# Patient Record
Sex: Female | Born: 1940 | Race: White | Hispanic: No | Marital: Single | Smoking: Never smoker
Health system: Southern US, Community
[De-identification: ages and names within clinical notes are randomized; demographics above are authoritative.]

## PROBLEM LIST (undated history)

## (undated) DIAGNOSIS — N189 Chronic kidney disease, unspecified: Secondary | ICD-10-CM

## (undated) DIAGNOSIS — K449 Diaphragmatic hernia without obstruction or gangrene: Secondary | ICD-10-CM

## (undated) DIAGNOSIS — E119 Type 2 diabetes mellitus without complications: Secondary | ICD-10-CM

## (undated) DIAGNOSIS — I739 Peripheral vascular disease, unspecified: Secondary | ICD-10-CM

## (undated) DIAGNOSIS — D649 Anemia, unspecified: Secondary | ICD-10-CM

## (undated) DIAGNOSIS — Z86010 Personal history of colonic polyps: Secondary | ICD-10-CM

## (undated) DIAGNOSIS — R011 Cardiac murmur, unspecified: Secondary | ICD-10-CM

## (undated) DIAGNOSIS — K222 Esophageal obstruction: Secondary | ICD-10-CM

## (undated) DIAGNOSIS — Z860101 Personal history of adenomatous and serrated colon polyps: Secondary | ICD-10-CM

## (undated) DIAGNOSIS — I10 Essential (primary) hypertension: Secondary | ICD-10-CM

## (undated) DIAGNOSIS — D369 Benign neoplasm, unspecified site: Secondary | ICD-10-CM

## (undated) DIAGNOSIS — K219 Gastro-esophageal reflux disease without esophagitis: Secondary | ICD-10-CM

## (undated) DIAGNOSIS — H35379 Puckering of macula, unspecified eye: Secondary | ICD-10-CM

## (undated) HISTORY — PX: OTHER SURGICAL HISTORY: SHX169

## (undated) HISTORY — PX: ABDOMINAL HYSTERECTOMY: SHX81

## (undated) HISTORY — DX: Diaphragmatic hernia without obstruction or gangrene: K44.9

## (undated) HISTORY — DX: Esophageal obstruction: K22.2

## (undated) HISTORY — DX: Type 2 diabetes mellitus without complications: E11.9

## (undated) HISTORY — PX: SPINE SURGERY: SHX786

## (undated) HISTORY — DX: Essential (primary) hypertension: I10

## (undated) HISTORY — DX: Personal history of colonic polyps: Z86.010

## (undated) HISTORY — DX: Gastro-esophageal reflux disease without esophagitis: K21.9

## (undated) HISTORY — DX: Chronic kidney disease, unspecified: N18.9

## (undated) HISTORY — DX: Personal history of adenomatous and serrated colon polyps: Z86.0101

## (undated) HISTORY — DX: Benign neoplasm, unspecified site: D36.9

## (undated) HISTORY — PX: ELBOW SURGERY: SHX618

---

## 1978-08-05 HISTORY — PX: BACK SURGERY: SHX140

## 1999-09-04 ENCOUNTER — Other Ambulatory Visit: Admission: RE | Admit: 1999-09-04 | Discharge: 1999-09-04 | Payer: Self-pay | Admitting: Gynecology

## 2002-10-11 ENCOUNTER — Other Ambulatory Visit: Admission: RE | Admit: 2002-10-11 | Discharge: 2002-10-11 | Payer: Self-pay

## 2002-10-25 ENCOUNTER — Ambulatory Visit (HOSPITAL_COMMUNITY): Admission: RE | Admit: 2002-10-25 | Discharge: 2002-10-25 | Payer: Self-pay | Admitting: Internal Medicine

## 2005-10-29 ENCOUNTER — Ambulatory Visit (HOSPITAL_COMMUNITY): Admission: RE | Admit: 2005-10-29 | Discharge: 2005-10-29 | Payer: Self-pay | Admitting: Internal Medicine

## 2005-10-29 ENCOUNTER — Ambulatory Visit: Payer: Self-pay | Admitting: Internal Medicine

## 2010-08-05 DIAGNOSIS — D369 Benign neoplasm, unspecified site: Secondary | ICD-10-CM

## 2010-08-05 DIAGNOSIS — K222 Esophageal obstruction: Secondary | ICD-10-CM

## 2010-08-05 DIAGNOSIS — K449 Diaphragmatic hernia without obstruction or gangrene: Secondary | ICD-10-CM

## 2010-08-05 HISTORY — DX: Diaphragmatic hernia without obstruction or gangrene: K44.9

## 2010-08-05 HISTORY — DX: Benign neoplasm, unspecified site: D36.9

## 2010-08-05 HISTORY — DX: Esophageal obstruction: K22.2

## 2010-11-21 ENCOUNTER — Encounter: Payer: Self-pay | Admitting: Internal Medicine

## 2010-11-26 ENCOUNTER — Ambulatory Visit (INDEPENDENT_AMBULATORY_CARE_PROVIDER_SITE_OTHER): Payer: Medicare Other | Admitting: Gastroenterology

## 2010-11-26 ENCOUNTER — Encounter: Payer: Self-pay | Admitting: Gastroenterology

## 2010-11-26 VITALS — BP 160/100 | HR 65 | Temp 98.5°F | Ht 66.0 in | Wt 163.4 lb

## 2010-11-26 DIAGNOSIS — D126 Benign neoplasm of colon, unspecified: Secondary | ICD-10-CM

## 2010-11-26 DIAGNOSIS — K219 Gastro-esophageal reflux disease without esophagitis: Secondary | ICD-10-CM

## 2010-11-26 NOTE — Assessment & Plan Note (Signed)
Chronic GERD for more than 5 years. No prior EGD. History of abnormal barium pill esophagram per patient description in the past with resolution of symptoms after starting omeprazole. Recommend EGD at this time to rule out complications of GERD such as Barrett's esophagus or esophageal stricture. I have discussed the risks, alternatives, benefits with regards to but not limited to the risk of reaction to medication, bleeding, infection, perforation and the patient is agreeable to proceed. Written consent to be obtained.

## 2010-11-26 NOTE — Progress Notes (Signed)
Primary Care Physician:  Estanislado Pandy, MD  Primary Gastroenterologist:  Roetta Sessions  Chief Complaint  Patient presents with  . Colon Cancer Screening    h/o polyps    HPI:  MELESSIA Garza is a 70 y.o. female here to schedule surveillance colonoscopy due to history of adenomatous colonic polyps. Her last colonoscopy was in March of 2007 at which time she had a diminutive rectal polyp which was benign. Back in 2004 she had a tubular adenoma removed. In 2008, she was hospitalized at [facility] for ischemic colitis. At that time she had a flexible sigmoidoscopy by Dr. Karilyn Cota.   She has a bowel movement 1-3 times per day. Stools are fairly soft and sometimes pudding consistency. Some fecal seepage at times.  No melena, brbpr. No abdominal pain. No n/v. She has a history of heartburn for greater than 5 years. No prior EGD. In the past she had issues with dysphagia and describes having a barium swallow done. She states that the pill got stuck. She was started on omeprazole and the dysphagia went away. Heartburn controlled on omeprazole. No dysphagia/odynophagia. If stops omeprazole for period of time, then feels like food sticks.      Current Outpatient Prescriptions  Medication Sig Dispense Refill  . lisinopril-hydrochlorothiazide (PRINZIDE,ZESTORETIC) 10-12.5 MG per tablet Take 1 tablet by mouth daily.        [name] omeprazole (PRILOSEC) 20 MG capsule Take 20 mg by mouth daily.          Allergies as of 11/26/2010  . (No Known Allergies)   Past Medical History  Diagnosis Date  . HTN (hypertension)   . GERD (gastroesophageal reflux disease)   . Hx of adenomatous colonic polyps     Last colonoscopy 2007. Benign polyp at that time. Tubular adenoma found at colonoscopy in 2004.     Past Surgical History  Procedure Date  . Back surgery 1980    ruptured disc  . Elbow surgery   . Abdominal hysterectomy     complicated by bladder injury s/p repair, endometriosis     Family History  Problem  Relation Age of Onset  . Colon cancer Neg Hx   . Breast cancer Mother   . Breast cancer Daughter   . Heart attack Father     History   Social History  . Marital Status: Single    Spouse Name: N/A    Number of Children: 4  . Years of Education: N/A   Occupational History  .       [employer]    Social History Main Topics  . Smoking status: Never Smoker   . Smokeless tobacco: Not on file  . Alcohol Use: No  . Drug Use: Not on file  . Sexually Active: Not on file     ROS:  General: Negative for anorexia, weight loss, fever, chills, fatigue, weakness. Eyes: Negative for vision changes.  ENT: Negative for hoarseness, difficulty swallowing , nasal congestion. CV: Negative for chest pain, angina, palpitations, dyspnea on exertion, peripheral edema.  Respiratory: Negative for dyspnea at rest, dyspnea on exertion, cough, sputum, wheezing.  GI: See history of present illness. GU:  Negative for dysuria, hematuria, urinary incontinence, urinary frequency, nocturnal urination.  MS: Negative for joint pain, low back pain.  Derm: Negative for rash or itching.  Neuro: Negative for weakness, abnormal sensation, seizure, frequent headaches, memory loss, confusion.  Psych: Negative for anxiety, depression, suicidal ideation, hallucinations.  Endo: Negative for unusual weight change.  Heme: Negative for  bruising or bleeding. Allergy: Negative for rash or hives.    Physical Examination:  BP 160/100  Pulse 65  Temp(Src) 98.5 F (36.9 C) (Tympanic)  Ht 5\' 6"  (1.676 m)  Wt 163 lb 6.4 oz (74.118 kg)  BMI 26.37 kg/m2   General: Well-nourished, well-developed in no acute distress.  Head: Normocephalic, atraumatic.   Eyes: Conjunctiva pink, no icterus. Mouth: Oropharyngeal mucosa moist and pink , no lesions erythema or exudate. Neck: Supple without thyromegaly, masses, or lymphadenopathy.  Lungs: Clear to auscultation bilaterally.  Heart: Regular rate and rhythm, no murmurs rubs or  gallops.  Abdomen: Bowel sounds are normal, nontender, nondistended, no hepatosplenomegaly or masses, no abdominal bruits or    hernia , no rebound or guarding.   Extremities: No lower extremity edema.  Neuro: Alert and oriented x 4 , grossly normal neurologically.  Skin: Warm and dry, no rash or jaundice.   Psych: Alert and cooperative, normal mood and affect.

## 2010-11-26 NOTE — Assessment & Plan Note (Signed)
Due for surveillance colonoscopy at this time.  I have discussed the risks, alternatives, benefits with regards to but not limited to the risk of reaction to medication, bleeding, infection, perforation and the patient is agreeable to proceed. Written consent to be obtained.  

## 2010-11-26 NOTE — Progress Notes (Signed)
Cc to PCP 

## 2010-12-11 HISTORY — PX: ESOPHAGOGASTRODUODENOSCOPY (EGD) WITH ESOPHAGEAL DILATION: SHX5812

## 2010-12-17 ENCOUNTER — Encounter: Payer: Medicare Other | Admitting: Internal Medicine

## 2010-12-17 ENCOUNTER — Other Ambulatory Visit: Payer: Self-pay | Admitting: Internal Medicine

## 2010-12-17 ENCOUNTER — Ambulatory Visit (HOSPITAL_COMMUNITY)
Admission: RE | Admit: 2010-12-17 | Discharge: 2010-12-17 | Disposition: A | Discharge status: Home / Self Care | Payer: Medicare Other | Source: Ambulatory Visit | Attending: Internal Medicine | Admitting: Internal Medicine

## 2010-12-17 DIAGNOSIS — Z860100 Personal history of colon polyps, unspecified: Secondary | ICD-10-CM | POA: Insufficient documentation

## 2010-12-17 DIAGNOSIS — D129 Benign neoplasm of anus and anal canal: Secondary | ICD-10-CM | POA: Insufficient documentation

## 2010-12-17 DIAGNOSIS — D128 Benign neoplasm of rectum: Secondary | ICD-10-CM

## 2010-12-17 DIAGNOSIS — D131 Benign neoplasm of stomach: Secondary | ICD-10-CM | POA: Insufficient documentation

## 2010-12-17 DIAGNOSIS — D126 Benign neoplasm of colon, unspecified: Secondary | ICD-10-CM | POA: Insufficient documentation

## 2010-12-17 DIAGNOSIS — K449 Diaphragmatic hernia without obstruction or gangrene: Secondary | ICD-10-CM | POA: Insufficient documentation

## 2010-12-17 DIAGNOSIS — Z79899 Other long term (current) drug therapy: Secondary | ICD-10-CM | POA: Insufficient documentation

## 2010-12-17 DIAGNOSIS — K633 Ulcer of intestine: Secondary | ICD-10-CM | POA: Insufficient documentation

## 2010-12-17 DIAGNOSIS — Z09 Encounter for follow-up examination after completed treatment for conditions other than malignant neoplasm: Secondary | ICD-10-CM

## 2010-12-17 DIAGNOSIS — R1310 Dysphagia, unspecified: Secondary | ICD-10-CM | POA: Insufficient documentation

## 2010-12-17 DIAGNOSIS — K210 Gastro-esophageal reflux disease with esophagitis: Secondary | ICD-10-CM

## 2010-12-17 DIAGNOSIS — I10 Essential (primary) hypertension: Secondary | ICD-10-CM | POA: Insufficient documentation

## 2010-12-17 DIAGNOSIS — E785 Hyperlipidemia, unspecified: Secondary | ICD-10-CM | POA: Insufficient documentation

## 2010-12-17 DIAGNOSIS — K222 Esophageal obstruction: Secondary | ICD-10-CM

## 2010-12-17 HISTORY — PX: COLONOSCOPY: SHX174

## 2010-12-18 LAB — H. PYLORI ANTIBODY, IGG: H Pylori IgG: <0.4 {ISR}

## 2010-12-24 NOTE — Progress Notes (Signed)
Cc to PCP 

## 2010-12-24 NOTE — Op Note (Signed)
NAMECAROLEANN, CASLER               ACCOUNT NO.:  [id number]  MEDICAL RECORD NO.:  [id number]           PATIENT TYPE:  O  LOCATION:  DAYP                          FACILITY:  [facility]  PHYSICIAN:  R. Roetta Sessions, M.D. DATE OF BIRTH:  September 21, 1940  DATE OF PROCEDURE:  12/17/2010 DATE OF DISCHARGE:                              OPERATIVE REPORT   PROCEDURE:  EGD with Elease Hashimoto dilation followed by ileocolonoscopy with multiple snare polypectomies biopsy.  INDICATIONS FOR PROCEDURE:  The patient is a pleasant 70 year old lady with history colonic adenomas here for surveillance.  She does not have any lower GI tract symptoms at this time, also she has chronic reflux, takes omeprazole intermittently and has esophageal dysphagia intermittently to solid food.  EGD and colonoscopy now being done. Risks, benefits, limitations, alternatives, and imponderables have been discussed, questions answered.  Please see the documentation in the medical record.  PROCEDURE NOTE:  O2 saturation, blood pressure, pulse, respirations were monitored throughout the entirety of both procedures.  CONSCIOUS SEDATION:  Versed 5 mg IV, Demerol 50 mg IV in divided doses.  INSTRUMENT:  Pentax video chip system.  Cetacaine spray for topical pharyngeal anesthesia.  FINDINGS:  EGD.  Examination of the tubular esophagus revealed a Schatzki's ring and a couple of tiny distal esophageal erosions.  There were no Barrett's esophagus.  EG junction was easily traversed.  Stomach:  Gastric cavity was emptied and insufflated well with air. Thorough examination of gastric mucosa including retroflexion of proximal stomach, esophagogastric junction demonstrated a small hiatal hernia and multiple gastric polyps, otherwise gastric mucosa appeared normal.  Pylorus was patent, easily traversed.  Examination of the bulb and second portion revealed bulbar erosions only.  THERAPEUTIC/DIAGNOSTIC MANEUVERS PERFORMED:  Scope was removed.   A 54- French Maloney dilator was passed to full insertion with ease.  A look back revealed no apparent complication related to passage of the dilator.  Subsequently, one of the gastric polyps was biopsied/removed from the biopsy forceps.  The patient tolerated the procedure and was prepped for colonoscopy.  Digital rectal exam revealed no abnormalities.  ENDOSCOPIC FINDINGS:  Prep was adequate.  Colon:  Colonic mucosa was surveyed from the rectosigmoid junction through the left transverse right colon to the appendiceal orifice, ileocecal valve/cecum.  These structures were well seen and photographed record.  The ileocecal valve was somewhat flat.  I intubated terminal ileum 20 cm.  The terminal ileum was markedly abnormal with multiple ulcers and cobblestoning of the distal 10 cm of ileum.  There were 2 ulcerated polyps in this segment of GI tract, the largest about approximately 1.25 cm, the other about 7 mm.  Please see the photographs.  Ultimately, biopsies of the ulcerated mucosa were taken separately for histologic study.  The two polyps were removed with hot snare cautery, recovered out through the [employer].  As far as the colon is concerned, the patient's multiple colonic polyps as well as snares and small splenic flexure polyp on the way in.  The patient had multiple ascending colon polyps which were snared and suctioned through the scope on the way out with the ileal polyp  fragment tubular released out to [facility] and then retrieved for colonoscopic polypectomy.  All previously mentioned mucosal surfaces were again seen.  The polyp fragments in the ileum were taken out with the scope in the [facility].  Scope was reintroduced into the rectum where there was a 6-mm polyp at 10 cm and a second 6-mm polyp at 2 cm which were resected with hot snare and submitted separately.  No other lesions were seen.  Retroflexion was performed.  The patient tolerated the lengthy colonoscopy well.   Cecal withdrawal time 22 minutes.  IMPRESSION: 1. EGD, Schatzki's ring with distal esophageal erosions consistent     with mild erosive reflux esophagitis status post dilation described     above. 2. Hiatal hernia.  Gastric polyp status post biopsy.  Bulbar erosions     of uncertain significance otherwise D1 and D2 appeared normal.  COLONOSCOPY FINDINGS: 1. Rectal polyps resected as described above. 2. Splenic flexure and ascending colon polyps resected. 3. Abnormal terminal ileum with ileal ulcers and cobblestoning     suspicious for inflammatory bowel disease.  Two large ileopolyp     status post hot snare polypectomy.  RECOMMENDATIONS: 1. Reflux literature and polyp literature provided to Ms. Dalbert Garnet.     She is to continue omeprazole 20 mg orally daily. 2. No aspirin or arthritis medications for 7 days. 3. Follow up on path. 4. Further recommendations to follow.     Jonathon Bellows, M.D.     RMR/MEDQ  D:  12/17/2010  T:  12/17/2010  Job:  [id number]  cc:   Fara Chute, MD Fax: [phone]  Electronically Signed by Lorrin Goodell M.D. on 12/24/2010 10:04:26 AM

## 2011-03-06 ENCOUNTER — Encounter (INDEPENDENT_AMBULATORY_CARE_PROVIDER_SITE_OTHER): Payer: Medicare Other | Admitting: Ophthalmology

## 2011-03-06 DIAGNOSIS — D3130 Benign neoplasm of unspecified choroid: Secondary | ICD-10-CM

## 2011-03-06 DIAGNOSIS — H43819 Vitreous degeneration, unspecified eye: Secondary | ICD-10-CM

## 2011-03-06 DIAGNOSIS — H35379 Puckering of macula, unspecified eye: Secondary | ICD-10-CM

## 2011-03-06 DIAGNOSIS — H2510 Age-related nuclear cataract, unspecified eye: Secondary | ICD-10-CM

## 2011-08-12 DIAGNOSIS — J209 Acute bronchitis, unspecified: Secondary | ICD-10-CM | POA: Diagnosis not present

## 2011-08-14 ENCOUNTER — Ambulatory Visit (INDEPENDENT_AMBULATORY_CARE_PROVIDER_SITE_OTHER): Payer: Medicare Other | Admitting: Ophthalmology

## 2011-08-14 DIAGNOSIS — I10 Essential (primary) hypertension: Secondary | ICD-10-CM

## 2011-08-14 DIAGNOSIS — H35039 Hypertensive retinopathy, unspecified eye: Secondary | ICD-10-CM | POA: Diagnosis not present

## 2011-08-14 DIAGNOSIS — H35379 Puckering of macula, unspecified eye: Secondary | ICD-10-CM

## 2011-08-14 DIAGNOSIS — H348392 Tributary (branch) retinal vein occlusion, unspecified eye, stable: Secondary | ICD-10-CM

## 2011-08-14 DIAGNOSIS — H2510 Age-related nuclear cataract, unspecified eye: Secondary | ICD-10-CM

## 2011-08-14 DIAGNOSIS — H43819 Vitreous degeneration, unspecified eye: Secondary | ICD-10-CM

## 2011-08-14 DIAGNOSIS — D3130 Benign neoplasm of unspecified choroid: Secondary | ICD-10-CM

## 2011-08-19 ENCOUNTER — Ambulatory Visit (INDEPENDENT_AMBULATORY_CARE_PROVIDER_SITE_OTHER): Payer: Medicare Other | Admitting: Ophthalmology

## 2011-08-19 DIAGNOSIS — H348392 Tributary (branch) retinal vein occlusion, unspecified eye, stable: Secondary | ICD-10-CM | POA: Diagnosis not present

## 2011-08-19 DIAGNOSIS — H43819 Vitreous degeneration, unspecified eye: Secondary | ICD-10-CM

## 2011-08-19 DIAGNOSIS — D3130 Benign neoplasm of unspecified choroid: Secondary | ICD-10-CM | POA: Diagnosis not present

## 2011-08-19 DIAGNOSIS — I10 Essential (primary) hypertension: Secondary | ICD-10-CM | POA: Diagnosis not present

## 2011-08-19 DIAGNOSIS — H35039 Hypertensive retinopathy, unspecified eye: Secondary | ICD-10-CM

## 2011-08-19 DIAGNOSIS — H35379 Puckering of macula, unspecified eye: Secondary | ICD-10-CM

## 2011-08-19 DIAGNOSIS — H2510 Age-related nuclear cataract, unspecified eye: Secondary | ICD-10-CM

## 2011-09-11 ENCOUNTER — Encounter (INDEPENDENT_AMBULATORY_CARE_PROVIDER_SITE_OTHER): Payer: Medicare Other | Admitting: Ophthalmology

## 2011-09-11 DIAGNOSIS — H2510 Age-related nuclear cataract, unspecified eye: Secondary | ICD-10-CM

## 2011-09-11 DIAGNOSIS — D3130 Benign neoplasm of unspecified choroid: Secondary | ICD-10-CM | POA: Diagnosis not present

## 2011-09-11 DIAGNOSIS — H348392 Tributary (branch) retinal vein occlusion, unspecified eye, stable: Secondary | ICD-10-CM

## 2011-09-11 DIAGNOSIS — I10 Essential (primary) hypertension: Secondary | ICD-10-CM | POA: Diagnosis not present

## 2011-09-11 DIAGNOSIS — H35039 Hypertensive retinopathy, unspecified eye: Secondary | ICD-10-CM | POA: Diagnosis not present

## 2011-09-11 DIAGNOSIS — H43819 Vitreous degeneration, unspecified eye: Secondary | ICD-10-CM

## 2011-10-09 ENCOUNTER — Encounter (INDEPENDENT_AMBULATORY_CARE_PROVIDER_SITE_OTHER): Payer: Medicare Other | Admitting: Ophthalmology

## 2011-10-09 DIAGNOSIS — H348392 Tributary (branch) retinal vein occlusion, unspecified eye, stable: Secondary | ICD-10-CM

## 2011-10-09 DIAGNOSIS — H35039 Hypertensive retinopathy, unspecified eye: Secondary | ICD-10-CM | POA: Diagnosis not present

## 2011-10-09 DIAGNOSIS — I10 Essential (primary) hypertension: Secondary | ICD-10-CM

## 2011-10-09 DIAGNOSIS — D3130 Benign neoplasm of unspecified choroid: Secondary | ICD-10-CM | POA: Diagnosis not present

## 2011-10-09 DIAGNOSIS — H43819 Vitreous degeneration, unspecified eye: Secondary | ICD-10-CM

## 2011-10-09 DIAGNOSIS — H2510 Age-related nuclear cataract, unspecified eye: Secondary | ICD-10-CM

## 2011-10-22 DIAGNOSIS — I10 Essential (primary) hypertension: Secondary | ICD-10-CM | POA: Diagnosis not present

## 2011-10-22 DIAGNOSIS — E7800 Pure hypercholesterolemia, unspecified: Secondary | ICD-10-CM | POA: Diagnosis not present

## 2011-10-29 DIAGNOSIS — I10 Essential (primary) hypertension: Secondary | ICD-10-CM | POA: Diagnosis not present

## 2011-10-29 DIAGNOSIS — E7800 Pure hypercholesterolemia, unspecified: Secondary | ICD-10-CM | POA: Diagnosis not present

## 2011-11-01 ENCOUNTER — Encounter (INDEPENDENT_AMBULATORY_CARE_PROVIDER_SITE_OTHER): Payer: Medicare Other | Admitting: Ophthalmology

## 2011-11-01 DIAGNOSIS — H348392 Tributary (branch) retinal vein occlusion, unspecified eye, stable: Secondary | ICD-10-CM

## 2011-11-01 DIAGNOSIS — H35039 Hypertensive retinopathy, unspecified eye: Secondary | ICD-10-CM

## 2011-11-01 DIAGNOSIS — H2510 Age-related nuclear cataract, unspecified eye: Secondary | ICD-10-CM

## 2011-11-01 DIAGNOSIS — I10 Essential (primary) hypertension: Secondary | ICD-10-CM | POA: Diagnosis not present

## 2011-11-01 DIAGNOSIS — D3130 Benign neoplasm of unspecified choroid: Secondary | ICD-10-CM

## 2011-11-01 DIAGNOSIS — H43819 Vitreous degeneration, unspecified eye: Secondary | ICD-10-CM

## 2011-11-29 ENCOUNTER — Encounter (INDEPENDENT_AMBULATORY_CARE_PROVIDER_SITE_OTHER): Payer: Medicare Other | Admitting: Ophthalmology

## 2011-11-29 DIAGNOSIS — H35039 Hypertensive retinopathy, unspecified eye: Secondary | ICD-10-CM

## 2011-11-29 DIAGNOSIS — I10 Essential (primary) hypertension: Secondary | ICD-10-CM

## 2011-11-29 DIAGNOSIS — H2510 Age-related nuclear cataract, unspecified eye: Secondary | ICD-10-CM

## 2011-11-29 DIAGNOSIS — H43819 Vitreous degeneration, unspecified eye: Secondary | ICD-10-CM

## 2011-11-29 DIAGNOSIS — H35349 Macular cyst, hole, or pseudohole, unspecified eye: Secondary | ICD-10-CM

## 2011-11-29 DIAGNOSIS — H35379 Puckering of macula, unspecified eye: Secondary | ICD-10-CM

## 2011-11-29 DIAGNOSIS — D3130 Benign neoplasm of unspecified choroid: Secondary | ICD-10-CM

## 2011-11-29 DIAGNOSIS — H348392 Tributary (branch) retinal vein occlusion, unspecified eye, stable: Secondary | ICD-10-CM

## 2011-12-26 ENCOUNTER — Encounter (INDEPENDENT_AMBULATORY_CARE_PROVIDER_SITE_OTHER): Payer: Medicare Other | Admitting: Ophthalmology

## 2011-12-31 ENCOUNTER — Encounter (INDEPENDENT_AMBULATORY_CARE_PROVIDER_SITE_OTHER): Payer: Medicare Other | Admitting: Ophthalmology

## 2011-12-31 DIAGNOSIS — H35039 Hypertensive retinopathy, unspecified eye: Secondary | ICD-10-CM | POA: Diagnosis not present

## 2011-12-31 DIAGNOSIS — H348392 Tributary (branch) retinal vein occlusion, unspecified eye, stable: Secondary | ICD-10-CM

## 2011-12-31 DIAGNOSIS — D3130 Benign neoplasm of unspecified choroid: Secondary | ICD-10-CM

## 2011-12-31 DIAGNOSIS — H35349 Macular cyst, hole, or pseudohole, unspecified eye: Secondary | ICD-10-CM | POA: Diagnosis not present

## 2011-12-31 DIAGNOSIS — I10 Essential (primary) hypertension: Secondary | ICD-10-CM

## 2011-12-31 DIAGNOSIS — H35379 Puckering of macula, unspecified eye: Secondary | ICD-10-CM

## 2011-12-31 DIAGNOSIS — H2510 Age-related nuclear cataract, unspecified eye: Secondary | ICD-10-CM

## 2011-12-31 DIAGNOSIS — H43819 Vitreous degeneration, unspecified eye: Secondary | ICD-10-CM

## 2012-02-17 ENCOUNTER — Encounter (INDEPENDENT_AMBULATORY_CARE_PROVIDER_SITE_OTHER): Payer: Medicare Other | Admitting: Ophthalmology

## 2012-02-17 DIAGNOSIS — H43819 Vitreous degeneration, unspecified eye: Secondary | ICD-10-CM

## 2012-02-17 DIAGNOSIS — H35039 Hypertensive retinopathy, unspecified eye: Secondary | ICD-10-CM

## 2012-02-17 DIAGNOSIS — H35379 Puckering of macula, unspecified eye: Secondary | ICD-10-CM

## 2012-02-17 DIAGNOSIS — I10 Essential (primary) hypertension: Secondary | ICD-10-CM

## 2012-02-17 DIAGNOSIS — H348392 Tributary (branch) retinal vein occlusion, unspecified eye, stable: Secondary | ICD-10-CM

## 2012-02-17 DIAGNOSIS — D3130 Benign neoplasm of unspecified choroid: Secondary | ICD-10-CM

## 2012-02-17 DIAGNOSIS — H2510 Age-related nuclear cataract, unspecified eye: Secondary | ICD-10-CM

## 2012-02-17 DIAGNOSIS — H35349 Macular cyst, hole, or pseudohole, unspecified eye: Secondary | ICD-10-CM

## 2012-03-02 DIAGNOSIS — E7800 Pure hypercholesterolemia, unspecified: Secondary | ICD-10-CM | POA: Diagnosis not present

## 2012-03-02 DIAGNOSIS — I10 Essential (primary) hypertension: Secondary | ICD-10-CM | POA: Diagnosis not present

## 2012-03-10 DIAGNOSIS — I10 Essential (primary) hypertension: Secondary | ICD-10-CM | POA: Diagnosis not present

## 2012-03-10 DIAGNOSIS — E7800 Pure hypercholesterolemia, unspecified: Secondary | ICD-10-CM | POA: Diagnosis not present

## 2012-03-10 DIAGNOSIS — I739 Peripheral vascular disease, unspecified: Secondary | ICD-10-CM | POA: Diagnosis not present

## 2012-03-13 DIAGNOSIS — M79609 Pain in unspecified limb: Secondary | ICD-10-CM | POA: Diagnosis not present

## 2012-03-13 DIAGNOSIS — I743 Embolism and thrombosis of arteries of the lower extremities: Secondary | ICD-10-CM | POA: Diagnosis not present

## 2012-03-20 ENCOUNTER — Encounter: Payer: Self-pay | Admitting: Vascular Surgery

## 2012-03-23 ENCOUNTER — Encounter: Payer: Self-pay | Admitting: Vascular Surgery

## 2012-03-23 ENCOUNTER — Ambulatory Visit (INDEPENDENT_AMBULATORY_CARE_PROVIDER_SITE_OTHER): Payer: Medicare Other | Admitting: Vascular Surgery

## 2012-03-23 ENCOUNTER — Encounter (HOSPITAL_COMMUNITY): Payer: Self-pay | Admitting: Pharmacy Technician

## 2012-03-23 ENCOUNTER — Encounter (INDEPENDENT_AMBULATORY_CARE_PROVIDER_SITE_OTHER): Payer: Medicare Other | Admitting: Ophthalmology

## 2012-03-23 ENCOUNTER — Other Ambulatory Visit: Payer: Self-pay | Admitting: *Deleted

## 2012-03-23 VITALS — BP 184/101 | HR 73 | Resp 24 | Ht 66.0 in | Wt 163.0 lb

## 2012-03-23 DIAGNOSIS — H35349 Macular cyst, hole, or pseudohole, unspecified eye: Secondary | ICD-10-CM

## 2012-03-23 DIAGNOSIS — H348392 Tributary (branch) retinal vein occlusion, unspecified eye, stable: Secondary | ICD-10-CM

## 2012-03-23 DIAGNOSIS — D3130 Benign neoplasm of unspecified choroid: Secondary | ICD-10-CM

## 2012-03-23 DIAGNOSIS — I739 Peripheral vascular disease, unspecified: Secondary | ICD-10-CM | POA: Insufficient documentation

## 2012-03-23 DIAGNOSIS — I70219 Atherosclerosis of native arteries of extremities with intermittent claudication, unspecified extremity: Secondary | ICD-10-CM | POA: Diagnosis not present

## 2012-03-23 DIAGNOSIS — H35039 Hypertensive retinopathy, unspecified eye: Secondary | ICD-10-CM

## 2012-03-23 DIAGNOSIS — H35379 Puckering of macula, unspecified eye: Secondary | ICD-10-CM

## 2012-03-23 DIAGNOSIS — H43819 Vitreous degeneration, unspecified eye: Secondary | ICD-10-CM

## 2012-03-23 DIAGNOSIS — H2510 Age-related nuclear cataract, unspecified eye: Secondary | ICD-10-CM

## 2012-03-23 DIAGNOSIS — I10 Essential (primary) hypertension: Secondary | ICD-10-CM

## 2012-03-23 NOTE — Progress Notes (Signed)
Subjective:     Patient ID: Donna Garza, female   DOB: 12/29/1940, 71 y.o.   MRN: [id number]  HPI this 71-year-old female was referred by Dr. Paul Sasser for claudication symptoms in the right leg. Patient states that for the past year or so she has been having cramping in the right calf when she ambulates significant distances. This has worsened over the past several months. She is able to walk about one half to one block before severe cramping in the right calf occurs and shortly thereafter she must stop. She has mild symptoms in the contralateral left leg. She has no history of rest pain, nonhealing ulcers, infection, gangrene, or other problems. She denies a history of vascular problems such as coronary artery disease or stroke. She states symptoms are now beginning to limit her significantly. She will occasionally awaken at night with leg cramps but no rest pain in the feet. There  Past Medical History  Diagnosis Date  . HTN (hypertension)   . GERD (gastroesophageal reflux disease)   . Hx of adenomatous colonic polyps     Last colonoscopy 2007. Benign polyp at that time. Tubular adenoma found at colonoscopy in 2004.    History  Substance Use Topics  . Smoking status: Never Smoker   . Smokeless tobacco: Not on file  . Alcohol Use: No    Family History  Problem Relation Age of Onset  . Colon cancer Neg Hx   . Breast cancer Mother   . Breast cancer Daughter   . Heart attack Father     No Known Allergies  Current outpatient prescriptions:pravastatin (PRAVACHOL) 20 MG tablet, Take 20 mg by mouth daily., Disp: , Rfl: ;  lisinopril-hydrochlorothiazide (PRINZIDE,ZESTORETIC) 10-12.5 MG per tablet, Take 1 tablet by mouth daily.  , Disp: , Rfl: ;  omeprazole (PRILOSEC) 20 MG capsule, Take 20 mg by mouth daily.  , Disp: , Rfl:   BP 184/101  Pulse 73  Resp 24  Ht 5' 6" (1.676 m)  Wt 163 lb (73.936 kg)  BMI 26.31 kg/m2  Body mass index is 26.31 kg/(m^2).         Review of  Systems has a remote history of difficulty swallowing and takes Prilosec. Denies chest pain, dyspnea on exertion, PND, orthopnea, hemoptysis, lateralizing weakness, amaurosis fugax, diplopia, blurred vision, syncope. She does have occasional visual issues with the right eye which are treated by her ophthalmologist. All other systems are negative and complete review of systems    Objective:   Physical Exam blood pressure 184/100 heart rate 73 respirations 24 Gen.-alert and oriented x3 in no apparent distress HEENT normal for age Lungs no rhonchi or wheezing Cardiovascular regular rhythm no murmurs carotid pulses 3+ palpable no bruits audible Abdomen soft nontender no palpable masses Musculoskeletal free of  major deformities Skin clear -no rashes Neurologic normal Lower extremities 2+ femoral pulses bilaterally. Absent popliteal and distal pulses in right leg. Left leg with 1-2+ popliteal pulse and absent distal pulses. The feet adequately perfused with no evidence varicosities or ulceration.  Today I reviewed the records provided by Dr. Sasser which include ABIs performed by [facility]. ABI on the right is 0.37 on the left is 0.88 ALT BDI-II superficial femoral occlusive disease.      Assessment:     Intermittent claudication right leg greater than left due to bilateral superficial femoral occlusive disease    Plan:     We'll obtain aortobifemoral angiography with runoff and possible right SFA PTA and   stenting if feasible by Dr. Chen on Thursday, August 22. If not candidate for peripheral intervention then she will require right femoral popliteal bypass grafting      

## 2012-03-26 ENCOUNTER — Ambulatory Visit (HOSPITAL_COMMUNITY)
Admission: RE | Admit: 2012-03-26 | Discharge: 2012-03-26 | Disposition: A | Discharge status: Home / Self Care | Payer: Medicare Other | Source: Ambulatory Visit | Attending: Vascular Surgery | Admitting: Vascular Surgery

## 2012-03-26 ENCOUNTER — Encounter (HOSPITAL_COMMUNITY)
Admission: RE | Disposition: A | Discharge status: Home / Self Care | Payer: Self-pay | Source: Ambulatory Visit | Attending: Vascular Surgery

## 2012-03-26 DIAGNOSIS — I70219 Atherosclerosis of native arteries of extremities with intermittent claudication, unspecified extremity: Secondary | ICD-10-CM | POA: Insufficient documentation

## 2012-03-26 DIAGNOSIS — I10 Essential (primary) hypertension: Secondary | ICD-10-CM | POA: Insufficient documentation

## 2012-03-26 DIAGNOSIS — K219 Gastro-esophageal reflux disease without esophagitis: Secondary | ICD-10-CM | POA: Diagnosis not present

## 2012-03-26 HISTORY — PX: ABDOMINAL AORTAGRAM: SHX5454

## 2012-03-26 LAB — POCT I-STAT, CHEM 8
Calcium, Ion: 1.27 mmol/L (ref 1.13–1.30)
Glucose, Bld: 100 mg/dL — ABNORMAL HIGH (ref 70–99)
HCT: 39 % (ref 36.0–46.0)
Hemoglobin: 13.3 g/dL (ref 12.0–15.0)
TCO2: 25 mmol/L (ref 0–100)

## 2012-03-26 SURGERY — ABDOMINAL AORTAGRAM
Anesthesia: LOCAL

## 2012-03-26 MED ORDER — ACETAMINOPHEN 325 MG PO TABS: 650.0000 mg | ORAL_TABLET | ORAL | Status: DC | PRN

## 2012-03-26 MED ORDER — LIDOCAINE HCL (PF) 1 % IJ SOLN
INTRAMUSCULAR | Status: AC
  Filled 2012-03-26: qty 30

## 2012-03-26 MED ORDER — SODIUM CHLORIDE 0.9 % IV SOLN
INTRAVENOUS | Status: DC
  Administered 2012-03-26: 08:00:00 via INTRAVENOUS

## 2012-03-26 MED ORDER — MIDAZOLAM HCL 2 MG/2ML IJ SOLN
INTRAMUSCULAR | Status: AC
  Filled 2012-03-26: qty 2

## 2012-03-26 MED ORDER — ONDANSETRON HCL 4 MG/2ML IJ SOLN: 4.0000 mg | Freq: Four times a day (QID) | INTRAMUSCULAR | Status: DC | PRN

## 2012-03-26 MED ORDER — MORPHINE SULFATE 4 MG/ML IJ SOLN: 2.0000 mg | INTRAMUSCULAR | Status: DC | PRN

## 2012-03-26 MED ORDER — HYDRALAZINE HCL 20 MG/ML IJ SOLN
10.0000 mg | INTRAMUSCULAR | Status: DC | PRN
  Administered 2012-03-26: 10 mg via INTRAVENOUS
  Filled 2012-03-26: qty 0.5

## 2012-03-26 MED ORDER — OXYCODONE-ACETAMINOPHEN 5-325 MG PO TABS: 1.0000 | ORAL_TABLET | ORAL | Status: DC | PRN

## 2012-03-26 MED ORDER — SODIUM CHLORIDE 0.9 % IV SOLN: 1.0000 mL/kg/h | INTRAVENOUS | Status: DC

## 2012-03-26 MED ORDER — HEPARIN (PORCINE) IN NACL 2-0.9 UNIT/ML-% IJ SOLN
INTRAMUSCULAR | Status: AC
  Filled 2012-03-26: qty 1000

## 2012-03-26 MED ORDER — FENTANYL CITRATE 0.05 MG/ML IJ SOLN
INTRAMUSCULAR | Status: AC
  Filled 2012-03-26: qty 2

## 2012-03-26 NOTE — H&P (View-Only) (Signed)
Subjective:     Patient ID: Donna Garza, female   DOB: 09/04/1940, 71 y.o.   MRN: [id number]  HPI this 71-year-old female was referred by Dr. Paul Sasser for claudication symptoms in the right leg. Patient states that for the past year or so she has been having cramping in the right calf when she ambulates significant distances. This has worsened over the past several months. She is able to walk about one half to one block before severe cramping in the right calf occurs and shortly thereafter she must stop. She has mild symptoms in the contralateral left leg. She has no history of rest pain, nonhealing ulcers, infection, gangrene, or other problems. She denies a history of vascular problems such as coronary artery disease or stroke. She states symptoms are now beginning to limit her significantly. She will occasionally awaken at night with leg cramps but no rest pain in the feet. There  Past Medical History  Diagnosis Date  . HTN (hypertension)   . GERD (gastroesophageal reflux disease)   . Hx of adenomatous colonic polyps     Last colonoscopy 2007. Benign polyp at that time. Tubular adenoma found at colonoscopy in 2004.    History  Substance Use Topics  . Smoking status: Never Smoker   . Smokeless tobacco: Not on file  . Alcohol Use: No    Family History  Problem Relation Age of Onset  . Colon cancer Neg Hx   . Breast cancer Mother   . Breast cancer Daughter   . Heart attack Father     No Known Allergies  Current outpatient prescriptions:pravastatin (PRAVACHOL) 20 MG tablet, Take 20 mg by mouth daily., Disp: , Rfl: ;  lisinopril-hydrochlorothiazide (PRINZIDE,ZESTORETIC) 10-12.5 MG per tablet, Take 1 tablet by mouth daily.  , Disp: , Rfl: ;  omeprazole (PRILOSEC) 20 MG capsule, Take 20 mg by mouth daily.  , Disp: , Rfl:   BP 184/101  Pulse 73  Resp 24  Ht 5' 6" (1.676 m)  Wt 163 lb (73.936 kg)  BMI 26.31 kg/m2  Body mass index is 26.31 kg/(m^2).         Review of  Systems has a remote history of difficulty swallowing and takes Prilosec. Denies chest pain, dyspnea on exertion, PND, orthopnea, hemoptysis, lateralizing weakness, amaurosis fugax, diplopia, blurred vision, syncope. She does have occasional visual issues with the right eye which are treated by her ophthalmologist. All other systems are negative and complete review of systems    Objective:   Physical Exam blood pressure 184/100 heart rate 73 respirations 24 Gen.-alert and oriented x3 in no apparent distress HEENT normal for age Lungs no rhonchi or wheezing Cardiovascular regular rhythm no murmurs carotid pulses 3+ palpable no bruits audible Abdomen soft nontender no palpable masses Musculoskeletal free of  major deformities Skin clear -no rashes Neurologic normal Lower extremities 2+ femoral pulses bilaterally. Absent popliteal and distal pulses in right leg. Left leg with 1-2+ popliteal pulse and absent distal pulses. The feet adequately perfused with no evidence varicosities or ulceration.  Today I reviewed the records provided by Dr. Sasser which include ABIs performed by [facility]. ABI on the right is 0.37 on the left is 0.88 ALT BDI-II superficial femoral occlusive disease.      Assessment:     Intermittent claudication right leg greater than left due to bilateral superficial femoral occlusive disease    Plan:     We'll obtain aortobifemoral angiography with runoff and possible right SFA PTA and   stenting if feasible by Dr. Chen on Thursday, August 22. If not candidate for peripheral intervention then she will require right femoral popliteal bypass grafting      

## 2012-03-26 NOTE — Op Note (Signed)
OPERATIVE NOTE   PROCEDURE: 1.  Left common femoral artery cannulation under ultrasound guidance 2.  Aortogram 3.  Right leg angiogram 4.  Left leg angiogram  PRE-OPERATIVE DIAGNOSIS: Bilateral leg claudication (right > left)  POST-OPERATIVE DIAGNOSIS: same as above   SURGEON: Leonides Sake, MD  ANESTHESIA: conscious sedation  ESTIMATED BLOOD LOSS: 30 cc  CONTRAST: 130 cc  FINDING(S):  Aorta:  patent  Superior mesenteric artery: patent Celiac artery: patent  Right Left  RA patent patent  CIA patent patent  EIA patent patent  IIA patent High grade orifice stenosis   CFA patent patent  SFA Flush occlusion with distal 1/3 reconstittution patent  PFA patent patent  Pop Patent Patent  Trif Patent AT takeoff, Patent proximal TPT which attenuates distally Patent  AT Patent proximally, occludes distally Patent  Pero Patent Patent  PT Occluded Patent   SPECIMEN(S):  none  INDICATIONS:   Donna Garza is a 71 y.o. female who presents with bilateral intermittent claudication (R>L).  The patient presents for: aortogram, bilateral runoff, and possible intervention.  I discussed with the patient the nature of angiographic procedures, especially the limited patencies of any endovascular intervention.  The patient is aware of that the risks of an angiographic procedure include but are not limited to: bleeding, infection, access site complications, renal failure, embolization, rupture of vessel, dissection, possible need for emergent surgical intervention, possible need for surgical procedures to treat the patient's pathology, and stroke and death.  The patient is aware of the risks and agrees to proceed.  DESCRIPTION: After full informed consent was obtained from the patient, the patient was brought back to the angiography suite.  The patient was placed supine upon the angiography table and connected to monitoring equipment.  The patient was then given conscious sedation, the amounts of  which are documented in the patient's chart.  The patient was prepped and drape in the standard fashion for an angiographic procedure.  At this point, attention was turned to the left groin.  Under ultrasound guidance, the left common femoral artery was cannulated with a 18 gauge needle.  The [facility] wire was passed up into the aorta.  The needle was exchanged for a 5-Fr sheath, which was advanced over the wire into the common femoral artery.  The dilator was then removed.  The Omniflush catheter was then loaded over the wire up to the level of L1.  The catheter was connected to the power injector circuit.  After de-airring and de-clotting the circuit, a power injector aortogram was completed.  The findings are as listed above.  I pulled the catheter down to proximal to the bifurcation and did a pelvic injection.  The [facility] wire was replaced in the catheter, and using the [place] and Omniflush catheter, the right common iliac artery was selected.  The catheter and wire were advanced into the external iliac artery.  An automated right leg runoff was completed after de-airring and de-clotting the circuit.  The findings are as listed above.  The [facility] wire was replaced in the catheter to straighten the crook of the catheter.  Both were removed together from the sheath.  The left sheath was aspirated and no clot was present.  The sheath was connected to the power injector circuit.  An automated left leg runoff was completed after de-airring and de-clotting the circuit.  The findings are as listed above.  The sheath was aspirated.  No clots were present and the sheath was reloaded with heparinized saline.  Based on the images, this patient needs: Right femoral to above-the-knee popliteal bypass.  COMPLICATIONS: none  CONDITION: stable  Leonides Sake, MD [facility] [place] Office: [phone] Pager: [phone]  03/26/2012, 11:02 AM

## 2012-03-26 NOTE — Interval H&P Note (Signed)
[facility] [place]  History and Physical Update  The patient was interviewed and re-examined.  The patient's previous History and Physical has been reviewed and is unchanged.  There is no change in the plan of care: aortogram, bilateral leg runoff, possible right leg intervention.   Leonides Sake, MD [facility] [place] Office: [phone] Pager: [phone]  03/26/2012, 7:00 AM

## 2012-03-30 ENCOUNTER — Other Ambulatory Visit: Payer: Self-pay | Admitting: *Deleted

## 2012-03-31 NOTE — Pre-Procedure Instructions (Signed)
20 Donna Garza  03/31/2012   Your procedure is scheduled on:  04-10-2012  Report to [facility] at 5:30 AM.  Call this number if you have problems the morning of surgery: [phone]   Remember:   Do not eat food or drink:After Midnight.    Take these medicines the morning of surgery with A SIP OF WATER: Omeprazole(Prilosec)   Do not wear jewelry, make-up or nail polish.  Do not wear lotions, powders, or perfumes. You may wear deodorant.  Do not shave 48 hours prior to surgery.  Do not bring valuables to the hospital.  Contacts, dentures or bridgework may not be worn into surgery.  Leave suitcase in the car. After surgery it may be brought to your room.  For patients admitted to the hospital, checkout time is 11:00 AM the day of discharge.    Special Instructions: CHG Shower Use Special Wash: 1/2 bottle night before surgery and 1/2 bottle morning of surgery.     Please read over the following fact sheets that you were given: Pain Booklet, Coughing and Deep Breathing, MRSA Information and Surgical Site Infection Prevention

## 2012-04-01 ENCOUNTER — Encounter (HOSPITAL_COMMUNITY): Payer: Self-pay

## 2012-04-01 ENCOUNTER — Encounter (HOSPITAL_COMMUNITY)
Admission: RE | Admit: 2012-04-01 | Discharge: 2012-04-01 | Disposition: A | Discharge status: Home / Self Care | Payer: Medicare Other | Source: Ambulatory Visit | Attending: Vascular Surgery | Admitting: Vascular Surgery

## 2012-04-01 DIAGNOSIS — Z01811 Encounter for preprocedural respiratory examination: Secondary | ICD-10-CM | POA: Diagnosis not present

## 2012-04-01 HISTORY — DX: Peripheral vascular disease, unspecified: I73.9

## 2012-04-01 LAB — URINALYSIS, ROUTINE W REFLEX MICROSCOPIC
Bilirubin Urine: NEGATIVE
Glucose, UA: NEGATIVE mg/dL
Hgb urine dipstick: NEGATIVE
Specific Gravity, Urine: 1.018 (ref 1.005–1.030)
Urobilinogen, UA: 0.2 mg/dL (ref 0.0–1.0)
pH: 6 (ref 5.0–8.0)

## 2012-04-01 LAB — COMPREHENSIVE METABOLIC PANEL
ALT: 16 U/L (ref 0–35)
AST: 19 U/L (ref 0–37)
Albumin: 4 g/dL (ref 3.5–5.2)
Alkaline Phosphatase: 112 U/L (ref 39–117)
CO2: 29 mEq/L (ref 19–32)
Chloride: 101 mEq/L (ref 96–112)
Creatinine, Ser: 0.87 mg/dL (ref 0.50–1.10)
GFR calc non Af Amer: 66 mL/min — ABNORMAL LOW (ref >90)
Potassium: 3.8 mEq/L (ref 3.5–5.1)
Total Bilirubin: 0.3 mg/dL (ref 0.3–1.2)

## 2012-04-01 LAB — CBC
MCV: 81.4 fL (ref 78.0–100.0)
Platelets: 236 10*3/uL (ref 150–400)
RBC: 4.85 MIL/uL (ref 3.87–5.11)
RDW: 13.5 % (ref 11.5–15.5)
WBC: 8.7 10*3/uL (ref 4.0–10.5)

## 2012-04-01 LAB — SURGICAL PCR SCREEN
MRSA, PCR: NEGATIVE
Staphylococcus aureus: NEGATIVE

## 2012-04-01 LAB — PROTIME-INR: Prothrombin Time: 13.3 seconds (ref 11.6–15.2)

## 2012-04-01 LAB — APTT: aPTT: 28 seconds (ref 24–37)

## 2012-04-01 LAB — ABO/RH: ABO/RH(D): A POS

## 2012-04-09 MED ORDER — DEXTROSE 5 % IV SOLN
1.5000 g | INTRAVENOUS | Status: AC
  Administered 2012-04-10: 1.5 g via INTRAVENOUS
  Filled 2012-04-09: qty 1.5

## 2012-04-10 ENCOUNTER — Inpatient Hospital Stay (HOSPITAL_COMMUNITY)
Admission: RE | Admit: 2012-04-10 | Discharge: 2012-04-12 | DRG: 254 | Disposition: A | Discharge status: Home / Self Care | Payer: Medicare Other | Source: Ambulatory Visit | Attending: Vascular Surgery | Admitting: Vascular Surgery

## 2012-04-10 ENCOUNTER — Ambulatory Visit (HOSPITAL_COMMUNITY): Payer: Medicare Other

## 2012-04-10 ENCOUNTER — Telehealth: Payer: Self-pay | Admitting: Vascular Surgery

## 2012-04-10 ENCOUNTER — Ambulatory Visit (HOSPITAL_COMMUNITY): Payer: Medicare Other | Admitting: Anesthesiology

## 2012-04-10 ENCOUNTER — Encounter (HOSPITAL_COMMUNITY)
Admission: RE | Disposition: A | Discharge status: Home / Self Care | Payer: Self-pay | Source: Ambulatory Visit | Attending: Vascular Surgery

## 2012-04-10 ENCOUNTER — Encounter (HOSPITAL_COMMUNITY): Payer: Self-pay | Admitting: *Deleted

## 2012-04-10 ENCOUNTER — Encounter (HOSPITAL_COMMUNITY): Payer: Self-pay | Admitting: Anesthesiology

## 2012-04-10 DIAGNOSIS — I70219 Atherosclerosis of native arteries of extremities with intermittent claudication, unspecified extremity: Principal | ICD-10-CM | POA: Diagnosis present

## 2012-04-10 DIAGNOSIS — I739 Peripheral vascular disease, unspecified: Secondary | ICD-10-CM

## 2012-04-10 DIAGNOSIS — I798 Other disorders of arteries, arterioles and capillaries in diseases classified elsewhere: Secondary | ICD-10-CM | POA: Diagnosis not present

## 2012-04-10 HISTORY — PX: FEMORAL-POPLITEAL BYPASS GRAFT: SHX937

## 2012-04-10 HISTORY — PX: INTRAOPERATIVE ARTERIOGRAM: SHX5157

## 2012-04-10 LAB — CBC
HCT: 34 % — ABNORMAL LOW (ref 36.0–46.0)
MCV: 81.9 fL (ref 78.0–100.0)
Platelets: 214 10*3/uL (ref 150–400)
RBC: 4.15 MIL/uL (ref 3.87–5.11)
WBC: 13.3 10*3/uL — ABNORMAL HIGH (ref 4.0–10.5)

## 2012-04-10 LAB — CREATININE, SERUM: GFR calc Af Amer: 83 mL/min — ABNORMAL LOW (ref >90)

## 2012-04-10 SURGERY — BYPASS GRAFT FEMORAL-POPLITEAL ARTERY
Anesthesia: General | Site: Leg Upper | Laterality: Right | Wound class: Clean

## 2012-04-10 MED ORDER — POTASSIUM CHLORIDE CRYS ER 20 MEQ PO TBCR: 20.0000 meq | EXTENDED_RELEASE_TABLET | Freq: Once | ORAL | Status: AC | PRN

## 2012-04-10 MED ORDER — MORPHINE SULFATE 2 MG/ML IJ SOLN
2.0000 mg | INTRAMUSCULAR | Status: DC | PRN
  Administered 2012-04-10 – 2012-04-11 (×3): 2 mg via INTRAVENOUS
  Filled 2012-04-10 (×3): qty 1

## 2012-04-10 MED ORDER — ALUM & MAG HYDROXIDE-SIMETH 200-200-20 MG/5ML PO SUSP
15.0000 mL | ORAL | Status: DC | PRN
  Administered 2012-04-11: 30 mL via ORAL
  Filled 2012-04-10: qty 30

## 2012-04-10 MED ORDER — PROPOFOL 10 MG/ML IV EMUL
INTRAVENOUS | Status: DC | PRN
  Administered 2012-04-10: 120 mg via INTRAVENOUS

## 2012-04-10 MED ORDER — LABETALOL HCL 5 MG/ML IV SOLN
10.0000 mg | INTRAVENOUS | Status: DC | PRN
  Filled 2012-04-10: qty 4

## 2012-04-10 MED ORDER — OXYCODONE HCL 5 MG PO TABS: 5.0000 mg | ORAL_TABLET | Freq: Once | ORAL | Status: DC | PRN

## 2012-04-10 MED ORDER — KCL IN DEXTROSE-NACL 20-5-0.45 MEQ/L-%-% IV SOLN
INTRAVENOUS | Status: DC
  Administered 2012-04-10 – 2012-04-11 (×2): via INTRAVENOUS
  Filled 2012-04-10 (×3): qty 1000

## 2012-04-10 MED ORDER — SODIUM CHLORIDE 0.9 % IR SOLN
Status: DC | PRN
  Administered 2012-04-10: 09:00:00

## 2012-04-10 MED ORDER — ENOXAPARIN SODIUM 40 MG/0.4ML ~~LOC~~ SOLN
40.0000 mg | SUBCUTANEOUS | Status: DC
  Administered 2012-04-11: 40 mg via SUBCUTANEOUS
  Filled 2012-04-10 (×2): qty 0.4

## 2012-04-10 MED ORDER — METOPROLOL TARTRATE 1 MG/ML IV SOLN: 2.0000 mg | INTRAVENOUS | Status: DC | PRN

## 2012-04-10 MED ORDER — OXYCODONE HCL 5 MG/5ML PO SOLN: 5.0000 mg | Freq: Once | ORAL | Status: DC | PRN

## 2012-04-10 MED ORDER — TEMAZEPAM 15 MG PO CAPS: 15.0000 mg | ORAL_CAPSULE | Freq: Every evening | ORAL | Status: DC | PRN

## 2012-04-10 MED ORDER — LIDOCAINE HCL (CARDIAC) 20 MG/ML IV SOLN
INTRAVENOUS | Status: DC | PRN
  Administered 2012-04-10 (×2): 50 mg via INTRAVENOUS

## 2012-04-10 MED ORDER — SODIUM CHLORIDE 0.9 % IV SOLN: INTRAVENOUS | Status: DC

## 2012-04-10 MED ORDER — HYDROMORPHONE HCL PF 1 MG/ML IJ SOLN
INTRAMUSCULAR | Status: AC
  Filled 2012-04-10: qty 1

## 2012-04-10 MED ORDER — MIDAZOLAM HCL 5 MG/5ML IJ SOLN
INTRAMUSCULAR | Status: DC | PRN
  Administered 2012-04-10 (×2): 1 mg via INTRAVENOUS

## 2012-04-10 MED ORDER — LISINOPRIL 20 MG PO TABS
20.0000 mg | ORAL_TABLET | Freq: Every day | ORAL | Status: DC
  Administered 2012-04-10 – 2012-04-11 (×2): 20 mg via ORAL
  Filled 2012-04-10 (×3): qty 1

## 2012-04-10 MED ORDER — LACTATED RINGERS IV SOLN
INTRAVENOUS | Status: DC | PRN
  Administered 2012-04-10 (×3): via INTRAVENOUS

## 2012-04-10 MED ORDER — PANTOPRAZOLE SODIUM 40 MG PO TBEC
40.0000 mg | DELAYED_RELEASE_TABLET | Freq: Every day | ORAL | Status: DC
  Administered 2012-04-11: 40 mg via ORAL
  Filled 2012-04-10: qty 1

## 2012-04-10 MED ORDER — OXYCODONE HCL 5 MG PO TABS: 5.0000 mg | ORAL_TABLET | Freq: Four times a day (QID) | ORAL | Status: AC | PRN

## 2012-04-10 MED ORDER — HYDRALAZINE HCL 20 MG/ML IJ SOLN
10.0000 mg | INTRAMUSCULAR | Status: DC | PRN
  Filled 2012-04-10: qty 0.5

## 2012-04-10 MED ORDER — EPHEDRINE SULFATE 50 MG/ML IJ SOLN
INTRAMUSCULAR | Status: DC | PRN
  Administered 2012-04-10: 10 mg via INTRAVENOUS
  Administered 2012-04-10 (×2): 5 mg via INTRAVENOUS
  Administered 2012-04-10: 10 mg via INTRAVENOUS

## 2012-04-10 MED ORDER — HEPARIN SODIUM (PORCINE) 1000 UNIT/ML IJ SOLN
INTRAMUSCULAR | Status: DC | PRN
  Administered 2012-04-10: 6000 [IU] via INTRAVENOUS

## 2012-04-10 MED ORDER — HYDROMORPHONE HCL PF 1 MG/ML IJ SOLN
0.2500 mg | INTRAMUSCULAR | Status: DC | PRN
  Administered 2012-04-10: 0.5 mg via INTRAVENOUS

## 2012-04-10 MED ORDER — SIMVASTATIN 5 MG PO TABS
5.0000 mg | ORAL_TABLET | Freq: Every day | ORAL | Status: DC
  Administered 2012-04-10 – 2012-04-11 (×2): 5 mg via ORAL
  Filled 2012-04-10 (×3): qty 1

## 2012-04-10 MED ORDER — DEXTROSE 5 % IV SOLN
1.5000 g | Freq: Two times a day (BID) | INTRAVENOUS | Status: AC
  Administered 2012-04-10 – 2012-04-11 (×2): 1.5 g via INTRAVENOUS
  Filled 2012-04-10 (×2): qty 1.5

## 2012-04-10 MED ORDER — ROCURONIUM BROMIDE 100 MG/10ML IV SOLN
INTRAVENOUS | Status: DC | PRN
  Administered 2012-04-10: 50 mg via INTRAVENOUS

## 2012-04-10 MED ORDER — 0.9 % SODIUM CHLORIDE (POUR BTL) OPTIME
TOPICAL | Status: DC | PRN
  Administered 2012-04-10: 1000 mL

## 2012-04-10 MED ORDER — ACETAMINOPHEN 325 MG PO TABS
325.0000 mg | ORAL_TABLET | ORAL | Status: DC | PRN
  Administered 2012-04-11: 650 mg via ORAL
  Filled 2012-04-10: qty 2

## 2012-04-10 MED ORDER — PHENOL 1.4 % MT LIQD: 1.0000 | OROMUCOSAL | Status: DC | PRN

## 2012-04-10 MED ORDER — DOPAMINE-DEXTROSE 3.2-5 MG/ML-% IV SOLN: 3.0000 ug/kg/min | INTRAVENOUS | Status: DC

## 2012-04-10 MED ORDER — OXYCODONE HCL 5 MG PO TABS
5.0000 mg | ORAL_TABLET | ORAL | Status: DC | PRN
Start: 2012-04-10 — End: 2012-04-12

## 2012-04-10 MED ORDER — SUFENTANIL CITRATE 50 MCG/ML IV SOLN
INTRAVENOUS | Status: DC | PRN
  Administered 2012-04-10 (×2): 5 ug via INTRAVENOUS
  Administered 2012-04-10: 15 ug via INTRAVENOUS
  Administered 2012-04-10 (×2): 5 ug via INTRAVENOUS

## 2012-04-10 MED ORDER — IOHEXOL 300 MG/ML  SOLN
INTRAMUSCULAR | Status: DC | PRN
  Administered 2012-04-10: 50 mL via INTRAVENOUS

## 2012-04-10 MED ORDER — ACETAMINOPHEN 650 MG RE SUPP: 325.0000 mg | RECTAL | Status: DC | PRN

## 2012-04-10 MED ORDER — ONDANSETRON HCL 4 MG/2ML IJ SOLN: 4.0000 mg | Freq: Four times a day (QID) | INTRAMUSCULAR | Status: DC | PRN

## 2012-04-10 MED ORDER — ONDANSETRON HCL 4 MG/2ML IJ SOLN
INTRAMUSCULAR | Status: DC | PRN
  Administered 2012-04-10: 4 mg via INTRAVENOUS

## 2012-04-10 MED ORDER — DOCUSATE SODIUM 100 MG PO CAPS
100.0000 mg | ORAL_CAPSULE | Freq: Every day | ORAL | Status: DC
  Administered 2012-04-11: 100 mg via ORAL
  Filled 2012-04-10 (×2): qty 1

## 2012-04-10 MED ORDER — GUAIFENESIN-DM 100-10 MG/5ML PO SYRP: 15.0000 mL | ORAL_SOLUTION | ORAL | Status: DC | PRN

## 2012-04-10 MED ORDER — SODIUM CHLORIDE 0.9 % IV SOLN: 500.0000 mL | Freq: Once | INTRAVENOUS | Status: AC | PRN

## 2012-04-10 SURGICAL SUPPLY — 59 items
ADH SKN CLS APL DERMABOND .7 (GAUZE/BANDAGES/DRESSINGS) ×8
BANDAGE ESMARK 6X9 LF (GAUZE/BANDAGES/DRESSINGS) IMPLANT
BNDG CMPR 9X6 STRL LF SNTH (GAUZE/BANDAGES/DRESSINGS)
BNDG ESMARK 6X9 LF (GAUZE/BANDAGES/DRESSINGS)
BOOT SUTURE AID YELLOW STND (SUTURE) IMPLANT
CANISTER SUCTION 2500CC (MISCELLANEOUS) ×3 IMPLANT
CLIP TI MEDIUM 24 (CLIP) ×3 IMPLANT
CLIP TI WIDE RED SMALL 24 (CLIP) ×3 IMPLANT
CLOTH BEACON ORANGE TIMEOUT ST (SAFETY) ×3 IMPLANT
COVER SURGICAL LIGHT HANDLE (MISCELLANEOUS) ×3 IMPLANT
DECANTER SPIKE VIAL GLASS SM (MISCELLANEOUS) IMPLANT
DERMABOND ADVANCED (GAUZE/BANDAGES/DRESSINGS) ×4
DERMABOND ADVANCED .7 DNX12 (GAUZE/BANDAGES/DRESSINGS) ×2 IMPLANT
DRAIN SNY 10X20 3/4 PERF (WOUND CARE) IMPLANT
DRAPE WARM FLUID 44X44 (DRAPE) ×3 IMPLANT
DRAPE X-RAY CASS 24X20 (DRAPES) ×1 IMPLANT
ELECT REM PT RETURN 9FT ADLT (ELECTROSURGICAL) ×3
ELECTRODE REM PT RTRN 9FT ADLT (ELECTROSURGICAL) ×2 IMPLANT
EVACUATOR SILICONE 100CC (DRAIN) IMPLANT
GLOVE BIO SURGEON STRL SZ 6.5 (GLOVE) ×2 IMPLANT
GLOVE BIOGEL PI IND STRL 6.5 (GLOVE) IMPLANT
GLOVE BIOGEL PI IND STRL 7.0 (GLOVE) IMPLANT
GLOVE BIOGEL PI INDICATOR 6.5 (GLOVE) ×3
GLOVE BIOGEL PI INDICATOR 7.0 (GLOVE) ×4
GLOVE ECLIPSE 7.0 STRL STRAW (GLOVE) ×1 IMPLANT
GLOVE SS BIOGEL STRL SZ 7 (GLOVE) ×2 IMPLANT
GLOVE SUPERSENSE BIOGEL SZ 7 (GLOVE) ×1
GLOVE SURG SS PI 6.5 STRL IVOR (GLOVE) ×2 IMPLANT
GOWN STRL NON-REIN LRG LVL3 (GOWN DISPOSABLE) ×9 IMPLANT
INSERT FOGARTY SM (MISCELLANEOUS) ×3 IMPLANT
KIT BASIN OR (CUSTOM PROCEDURE TRAY) ×3 IMPLANT
KIT ROOM TURNOVER OR (KITS) ×3 IMPLANT
NS IRRIG 1000ML POUR BTL (IV SOLUTION) ×6 IMPLANT
PACK PERIPHERAL VASCULAR (CUSTOM PROCEDURE TRAY) ×3 IMPLANT
PAD ARMBOARD 7.5X6 YLW CONV (MISCELLANEOUS) ×6 IMPLANT
PADDING CAST COTTON 6X4 STRL (CAST SUPPLIES) IMPLANT
SET COLLECT BLD 21X3/4 12 (NEEDLE) ×1 IMPLANT
STOPCOCK 4 WAY LG BORE MALE ST (IV SETS) ×1 IMPLANT
SUT PROLENE 5 0 C 1 24 (SUTURE) ×1 IMPLANT
SUT PROLENE 6 0 BV (SUTURE) IMPLANT
SUT PROLENE 6 0 C 1 24 (SUTURE) ×1 IMPLANT
SUT PROLENE 6 0 CC (SUTURE) ×7 IMPLANT
SUT PROLENE 7 0 BV 1 (SUTURE) ×2 IMPLANT
SUT PROLENE 7 0 BV1 MDA (SUTURE) IMPLANT
SUT SILK 2 0 SH (SUTURE) ×3 IMPLANT
SUT SILK 3 0 (SUTURE) ×3
SUT SILK 3-0 18XBRD TIE 12 (SUTURE) IMPLANT
SUT SILK 4 0 (SUTURE) ×3
SUT SILK 4-0 18XBRD TIE 12 (SUTURE) IMPLANT
SUT VIC AB 2-0 CTX 36 (SUTURE) ×8 IMPLANT
SUT VIC AB 3-0 SH 27 (SUTURE) ×9
SUT VIC AB 3-0 SH 27X BRD (SUTURE) ×4 IMPLANT
SUT VICRYL 4-0 PS2 18IN ABS (SUTURE) ×1 IMPLANT
TOWEL OR 17X24 6PK STRL BLUE (TOWEL DISPOSABLE) ×6 IMPLANT
TOWEL OR 17X26 10 PK STRL BLUE (TOWEL DISPOSABLE) ×6 IMPLANT
TRAY FOLEY CATH 14FRSI W/METER (CATHETERS) ×3 IMPLANT
TUBING EXTENTION W/L.L. (IV SETS) ×1 IMPLANT
UNDERPAD 30X30 INCONTINENT (UNDERPADS AND DIAPERS) ×3 IMPLANT
WATER STERILE IRR 1000ML POUR (IV SOLUTION) ×3 IMPLANT

## 2012-04-10 NOTE — Transfer of Care (Signed)
Immediate Anesthesia Transfer of Care Note  Patient: Donna Garza  Procedure(s) Performed: Procedure(s) (LRB) with comments: BYPASS GRAFT FEMORAL-POPLITEAL ARTERY (Right) - Right Femoral to Below Knee Popliteal Bypass with Vein INTRA OPERATIVE ARTERIOGRAM (Right)  Patient Location: PACU  Anesthesia Type: General  Level of Consciousness: awake, alert , oriented and sedated  Airway & Oxygen Therapy: Patient Spontanous Breathing and Patient connected to nasal cannula oxygen  Post-op Assessment: Report given to PACU RN, Post -op Vital signs reviewed and stable and Patient moving all extremities  Post vital signs: Reviewed and stable   Complications: No apparent anesthesia complications

## 2012-04-10 NOTE — Preoperative (Signed)
Beta Blockers   Reason not to administer Beta Blockers:Not Applicable 

## 2012-04-10 NOTE — Progress Notes (Signed)
Utilization review completed.  

## 2012-04-10 NOTE — H&P (View-Only) (Signed)
Subjective:     Patient ID: Donna Garza, female   DOB: [birth year], 71 y.o.   MRN: [id number]  HPI this 72 year old female was referred by Dr. Fara Chute for claudication symptoms in the right leg. Patient states that for the past year or so she has been having cramping in the right calf when she ambulates significant distances. This has worsened over the past several months. She is able to walk about one half to one block before severe cramping in the right calf occurs and shortly thereafter she must stop. She has mild symptoms in the contralateral left leg. She has no history of rest pain, nonhealing ulcers, infection, gangrene, or other problems. She denies a history of vascular problems such as coronary artery disease or stroke. She states symptoms are now beginning to limit her significantly. She will occasionally awaken at night with leg cramps but no rest pain in the feet. There  Past Medical History  Diagnosis Date  . HTN (hypertension)   . GERD (gastroesophageal reflux disease)   . Hx of adenomatous colonic polyps     Last colonoscopy 2007. Benign polyp at that time. Tubular adenoma found at colonoscopy in 2004.    History  Substance Use Topics  . Smoking status: Never Smoker   . Smokeless tobacco: Not on file  . Alcohol Use: No    Family History  Problem Relation Age of Onset  . Colon cancer Neg Hx   . Breast cancer Mother   . Breast cancer Daughter   . Heart attack Father     No Known Allergies  Current outpatient prescriptions:pravastatin (PRAVACHOL) 20 MG tablet, Take 20 mg by mouth daily., Disp: , Rfl: ;  lisinopril-hydrochlorothiazide (PRINZIDE,ZESTORETIC) 10-12.5 MG per tablet, Take 1 tablet by mouth daily.  , Disp: , Rfl: ;  omeprazole (PRILOSEC) 20 MG capsule, Take 20 mg by mouth daily.  , Disp: , Rfl:   BP 184/101  Pulse 73  Resp 24  Ht 5\' 6"  (1.676 m)  Wt 163 lb (73.936 kg)  BMI 26.31 kg/m2  Body mass index is 26.31 kg/(m^2).         Review of  Systems has a remote history of difficulty swallowing and takes Prilosec. Denies chest pain, dyspnea on exertion, PND, orthopnea, hemoptysis, lateralizing weakness, amaurosis fugax, diplopia, blurred vision, syncope. She does have occasional visual issues with the right eye which are treated by her ophthalmologist. All other systems are negative and complete review of systems    Objective:   Physical Exam blood pressure 184/100 heart rate 73 respirations 24 Gen.-alert and oriented x3 in no apparent distress HEENT normal for age Lungs no rhonchi or wheezing Cardiovascular regular rhythm no murmurs carotid pulses 3+ palpable no bruits audible Abdomen soft nontender no palpable masses Musculoskeletal free of  major deformities Skin clear -no rashes Neurologic normal Lower extremities 2+ femoral pulses bilaterally. Absent popliteal and distal pulses in right leg. Left leg with 1-2+ popliteal pulse and absent distal pulses. The feet adequately perfused with no evidence varicosities or ulceration.  Today I reviewed the records provided by Dr. Neita Carp which include ABIs performed by [facility]. ABI on the right is 0.37 on the left is 0.88 ALT BDI-II superficial femoral occlusive disease.      Assessment:     Intermittent claudication right leg greater than left due to bilateral superficial femoral occlusive disease    Plan:     We'll obtain aortobifemoral angiography with runoff and possible right SFA PTA and  stenting if feasible by Dr. Imogene Burn on Thursday, August 22. If not candidate for peripheral intervention then she will require right femoral popliteal bypass grafting

## 2012-04-10 NOTE — Progress Notes (Signed)
Report to Kay L. RN as primary care giver. 

## 2012-04-10 NOTE — Telephone Encounter (Addendum)
Message copied by Rosalyn Charters on Fri Apr 10, 2012  3:15 PM ------      Message from: Sharee Pimple      Created: Fri Apr 10, 2012 10:53 AM      Regarding: schedule                   ----- Message -----         From: Dara Lords, PA         Sent: 04/10/2012  10:45 AM           To: Sharee Pimple, CMA            S/p right fem pop bypass by Dr. Hart Rochester 04/10/12.            F/u in 2-3 weeks.            Thanks,      Samantha  notified pt. of fu appt. with dr. Hart Rochester on 04-28-12 at 2L45 pm l/v/m and mailed appt. letter

## 2012-04-10 NOTE — Anesthesia Preprocedure Evaluation (Signed)
Anesthesia Evaluation  Patient identified by MRN, date of birth, ID band Patient awake    Reviewed: Allergy & Precautions, H&P , NPO status , Patient's Chart, lab work & pertinent test results  Airway Mallampati: II TM Distance: >3 FB Neck ROM: Full    Dental No notable dental hx. (+) Teeth Intact and Dental Advisory Given   Pulmonary neg pulmonary ROS,  breath sounds clear to auscultation  Pulmonary exam normal       Cardiovascular hypertension, On Medications + Peripheral Vascular Disease Rhythm:Regular Rate:Normal     Neuro/Psych negative neurological ROS  negative psych ROS   GI/Hepatic Neg liver ROS, GERD-  Medicated and Controlled,  Endo/Other  negative endocrine ROS  Renal/GU negative Renal ROS  negative genitourinary   Musculoskeletal   Abdominal   Peds  Hematology negative hematology ROS (+)   Anesthesia Other Findings   Reproductive/Obstetrics negative OB ROS                           Anesthesia Physical Anesthesia Plan  ASA: III  Anesthesia Plan: General   Post-op Pain Management:    Induction: Intravenous  Airway Management Planned: Oral ETT  Additional Equipment:   Intra-op Plan:   Post-operative Plan: Extubation in OR  Informed Consent: I have reviewed the patients History and Physical, chart, labs and discussed the procedure including the risks, benefits and alternatives for the proposed anesthesia with the patient or authorized representative who has indicated his/her understanding and acceptance.   Dental advisory given  Plan Discussed with: CRNA  Anesthesia Plan Comments:         Anesthesia Quick Evaluation

## 2012-04-10 NOTE — Op Note (Signed)
OPERATIVE REPORT  Date of Surgery: 04/10/2012  Surgeon: Josephina Gip, MD  Assistant: Lelon Mast Rhyne,pa  Preop diagnosis-right superficial femoral occlusion with limiting claudication  Post-op Diagnosis: Same Procedure: Procedure(s): Right common femoral to popliteal-we-bypass using a nonreversible translocated saphenous vein graft from right leg  INTRA OPERATIVE ARTERIOGRAM right leg  Anesthesia: General  EBL: 100 cc  Complications: None  Procedure Details: Patient was taken to the operating room placed in the supine position at which time satisfactory general endotracheal anesthesia was administered the right leg was prepped with Betadine scrub and solution draped in routine sterile manner. Saphenous vein was exposed through a longitudinal incision in the right inguinal area it was traced down to the lower initially through 3 incisions along the medial aspect of the right leg. Branches were ligated with 3 and 4-0 silk ties and divided. The common superficial and profunda femoris arteries were dissected free and inguinal incision. Right superficial femoral artery was known to be totally occluded. There was good S. of the common femoral artery which had some degenerative changes but was a large vessel with a good S. initial popliteal artery was exposed in the above-knee position and slightly there was some thickening to palpation this it was decided to bypass the popliteal artery. Therefore the saphenous vein was exposed down to the proximal calf where bifurcated and became a smaller caliber. Popliteal artery was exposed below the knee is a soft normal-appearing vessel subfascial anatomic tunnel was created and the patient was heparinized. The femoral vessels were occluded with vascular clamps on the common femoral artery a 15 blade extended with Potts scissors. Proximal and the vein was spatulated and anastomosed end to side with 6-0 Prolene. Following completion of this the clamps were released  and there was a good S. first set of competent valves. Using a retrograde valvulotome the valves were rendered incompetent with excellent the distal end of the vein graft. The vein was marginal in size there was adequate about 2-1/2-3 mm. It was gently delivered through the tunnel popliteal artery occluded proximally and distally below the knee with a 15 blade extended with Potts scissors. There was a normal-appearing vessel with this plan. Was carefully measured spatulated anastomosed end to side with 6 0 Prolene. The clamps were then released there was excellent flow at the distal anastomosis and all flow with the Doppler in the dorsalis pedis artery in the foot. Intraoperative arteriogram was performed revealing a marginal size with runoff the anterior tibial artery. The posterior tibial artery was essentially occluded proximally with a small artery. Adequate hemostasis was achieved wounds closed in layers with Vicryl in a subcuticular fashion with Dermabond condition to the recovery room in stable condition 04/10/2012 10:45 AM

## 2012-04-10 NOTE — Anesthesia Postprocedure Evaluation (Signed)
  Anesthesia Post-op Note  Patient: Donna Garza  Procedure(s) Performed: Procedure(s) (LRB) with comments: BYPASS GRAFT FEMORAL-POPLITEAL ARTERY (Right) - Right Femoral to Below Knee Popliteal Bypass with Vein INTRA OPERATIVE ARTERIOGRAM (Right)  Patient Location: PACU  Anesthesia Type: General  Level of Consciousness: awake, alert  and oriented  Airway and Oxygen Therapy: Patient Spontanous Breathing and Patient connected to nasal cannula oxygen  Post-op Pain: mild  Post-op Assessment: Post-op Vital signs reviewed, Patient's Cardiovascular Status Stable, Respiratory Function Stable, Patent Airway and No signs of Nausea or vomiting  Post-op Vital Signs: Reviewed and stable  Complications: No apparent anesthesia complications

## 2012-04-10 NOTE — Interval H&P Note (Signed)
History and Physical Interval Note:  04/10/2012 7:33 AM  Donna Garza  has presented today for surgery, with the diagnosis of Peripheral Vascular Disease  The various methods of treatment have been discussed with the patient and family. After consideration of risks, benefits and other options for treatment, the patient has consented to  Procedure(s) (LRB) with comments: BYPASS GRAFT FEMORAL-POPLITEAL ARTERY (Right) - Right Femoral- Popliteal Bypass with Vein as a surgical intervention .  The patient's history has been reviewed, patient examined, no change in status, stable for surgery.  I have reviewed the patient's chart and labs.  Questions were answered to the patient's satisfaction.     Josephina Gip

## 2012-04-11 LAB — BASIC METABOLIC PANEL
CO2: 27 mEq/L (ref 19–32)
Calcium: 9.3 mg/dL (ref 8.4–10.5)
Chloride: 98 mEq/L (ref 96–112)
Potassium: 4 mEq/L (ref 3.5–5.1)
Sodium: 135 mEq/L (ref 135–145)

## 2012-04-11 LAB — CBC
Platelets: 226 10*3/uL (ref 150–400)
RBC: 4.23 MIL/uL (ref 3.87–5.11)
WBC: 10 10*3/uL (ref 4.0–10.5)

## 2012-04-11 NOTE — Progress Notes (Addendum)
[facility] Progress Note  04/11/2012 8:17 AM POD 1  Subjective:  Per RN and pt, she had an episode of hypotension this am when getting up to chair.  She had 4mg  of Morphine and when getting up to chair she felt dizzy-her systolic was in the upper 80s.  Once back in the bed, her systolic was in the 130s and she felt much better.  Tm 99.2 now afebrile HR  60s-90s BP  80s-170s systolic 100%RA  Filed Vitals:   04/11/12 0800  BP: 128/57  Pulse: 77  Temp:   Resp: 17    Physical Exam: Incisions:  All incisions are c/d/i. Extremities:  + doppler signal in the right PT/DP.  CBC    Component Value Date/Time   WBC 10.0 04/11/2012 0415   RBC 4.23 04/11/2012 0415   HGB 11.3* 04/11/2012 0415   HCT 34.4* 04/11/2012 0415   PLT 226 04/11/2012 0415   MCV 81.3 04/11/2012 0415   MCH 26.7 04/11/2012 0415   MCHC 32.8 04/11/2012 0415   RDW 13.6 04/11/2012 0415    BMET    Component Value Date/Time   NA 135 04/11/2012 0415   K 4.0 04/11/2012 0415   CL 98 04/11/2012 0415   CO2 27 04/11/2012 0415   GLUCOSE 138* 04/11/2012 0415   BUN 9 04/11/2012 0415   CREATININE 0.77 04/11/2012 0415   CALCIUM 9.3 04/11/2012 0415   GFRNONAA 83* 04/11/2012 0415   GFRAA >90 04/11/2012 0415    INR    Component Value Date/Time   INR 0.99 04/01/2012 0830     Intake/Output Summary (Last 24 hours) at 04/11/12 0817 Last data filed at 04/11/12 0700  Gross per 24 hour  Intake 2907.5 ml  Output   4300 ml  Net -1392.5 ml     Assessment/Plan:  71 y.o. female is s/p Right common femoral to popliteal-we-bypass using a nonreversible translocated saphenous vein graft from right leg  INTRA OPERATIVE ARTERIOGRAM right leg  POD 1  -patent right fem-pop bypass graft. -creatinine WNL and good UOP after intraoperative arteriogram -acute surgical blood loss anemia is stable from yesterday afternoon. -one episode of orthostatic hypotension after receiving pain medication.  Received dose of lisinopril last pm.  Will keep in  stepdown this morning and observe.   -Dr. Hart Rochester will be by later this am to evaluate pt. -probable transfer to 2000 later today.  Doreatha Massed, PA-C [facility] [phone] 04/11/2012 8:17 AM   Agree with above Suspect she had episode of postural hypotension earlier feels good now Incisions in right leg doing well 2-3+ dorsalis pedis pulse palpable right foot pink and warm  Plan ambulate today with help-DC IV fluids-transfer 2000  Hopefully home in a.m. if patient feels well and pain,

## 2012-04-11 NOTE — Progress Notes (Signed)
Pt ambulated approximately 4 feet to chair with one person assist. Once in chair pt complained of increased pain to right leg and some dizziness. She was given 2 mg morphine IV at 0648. At approximately 0705 pt complained of dizziness, lightheadedness and felt like she "was going to pass out." Pt BP 88/45 and HR 74. Pt was pale and diaphoretic at this time. She was assisted back to bed. No complaint of back pain or any other pain, her right leg inscisions still soft, and pink. She now states that she feels much better and her BP is 116/50. Day shift RN aware and at bedside.

## 2012-04-11 NOTE — Progress Notes (Signed)
Report called to receiving RN on unit 2000. Patient transferred to 2009 via wheelchair by nurse tech. Belongings and family accompanied patient.   Kathlene Cote A RN

## 2012-04-11 NOTE — Evaluation (Signed)
Physical Therapy Evaluation Patient Details Name: Donna Garza MRN: [id number] DOB: 1941/04/04 Today's Date: 04/11/2012 Time: 7829-5621 PT Time Calculation (min): 24 min  PT Assessment / Plan / Recommendation Clinical Impression  Pt admitted s/p fem pop bypass. Pt with hypotension episode this AM although BPs remained stable throughout this session. Pt will benefit from skilled PT in the acute care setting in order to maximize functional mobility prior to d/c home    PT Assessment  Patient needs continued PT services    Follow Up Recommendations  No PT follow up;Supervision for mobility/OOB    Barriers to Discharge        Equipment Recommendations  None recommended by PT (TBD by OT)    Recommendations for Other Services     Frequency Min 4X/week    Precautions / Restrictions Precautions Precautions: None   Pertinent Vitals/Pain Pt with minimal pain complaints      Mobility  Bed Mobility Bed Mobility: Supine to Sit;Sitting - Scoot to Edge of Bed Supine to Sit: 5: Supervision Sitting - Scoot to Edge of Bed: 5: Supervision Details for Bed Mobility Assistance: Supervision for safety and to monitor BP Transfers Transfers: Sit to Stand;Stand to Sit Sit to Stand: 4: Min assist;From bed;With upper extremity assist;From chair/3-in-1 Stand to Sit: 4: Min guard;To chair/3-in-1;With armrests;With upper extremity assist Details for Transfer Assistance: Verbal cueing for safest hand placement. Ambulation/Gait Ambulation/Gait Assistance: 4: Min assist Ambulation Distance (Feet): 35 Feet Assistive device: Rolling walker Ambulation/Gait Assistance Details: VC for proper sequencing. Pt not able to bear full weight through RLE although with good posture and stability. Gait Pattern: Decreased stride length;Decreased stance time - right;Decreased hip/knee flexion - right;Trunk flexed Gait velocity: decreased gait speed Stairs: No    Exercises     PT Diagnosis: Difficulty  walking;Acute pain  PT Problem List: Decreased strength;Decreased activity tolerance;Decreased mobility;Decreased knowledge of use of DME;Decreased safety awareness;Decreased knowledge of precautions;Pain PT Treatment Interventions: DME instruction;Gait training;Stair training;Functional mobility training;Therapeutic activities;Patient/family education   PT Goals Acute Rehab PT Goals PT Goal Formulation: With patient Time For Goal Achievement: 04/18/12 Potential to Achieve Goals: Good Pt will go Supine/Side to Sit: with modified independence PT Goal: Supine/Side to Sit - Progress: Goal set today Pt will go Sit to Supine/Side: with modified independence PT Goal: Sit to Supine/Side - Progress: Goal set today Pt will go Sit to Stand: with modified independence PT Goal: Sit to Stand - Progress: Goal set today Pt will go Stand to Sit: with modified independence PT Goal: Stand to Sit - Progress: Goal set today Pt will Transfer Bed to Chair/Chair to Bed: with supervision PT Transfer Goal: Bed to Chair/Chair to Bed - Progress: Goal set today Pt will Ambulate: >150 feet;with supervision;with least restrictive assistive device PT Goal: Ambulate - Progress: Goal set today Pt will Go Up / Down Stairs: 3-5 stairs;with supervision;with rail(s) PT Goal: Up/Down Stairs - Progress: Goal set today  Visit Information  Last PT Received On: 04/11/12 Assistance Needed: +1    Subjective Data      Prior Functioning  Home Living Lives With: Alone Available Help at Discharge: Family;Available 24 hours/day (for first week) Type of Home: House Home Access: Stairs to enter [employer] of Steps: 5 Entrance Stairs-Rails: Left Home Layout: One level Bathroom Shower/Tub: Forensic scientist: Standard Bathroom Accessibility: Yes How Accessible: Accessible via walker Home Adaptive Equipment: Dan Humphreys - four wheeled Prior Function Level of Independence: Independent Driving:  Yes Vocation: Full time employment Comments: Print production planner for  [employer] Communication Communication: No difficulties Dominant Hand: Right    Cognition  Overall Cognitive Status: Appears within functional limits for tasks assessed/performed Arousal/Alertness: Awake/alert Orientation Level: Appears intact for tasks assessed Behavior During Session: [facility] for tasks performed    Extremity/Trunk Assessment Right Upper Extremity Assessment RUE ROM/Strength/Tone: Within functional levels Left Upper Extremity Assessment LUE ROM/Strength/Tone: Within functional levels Right Lower Extremity Assessment RLE ROM/Strength/Tone: Unable to fully assess;Due to pain RLE Sensation: WFL - Light Touch Left Lower Extremity Assessment LLE ROM/Strength/Tone: Within functional levels LLE Sensation: WFL - Light Touch   Balance    End of Session PT - End of Session Equipment Utilized During Treatment: Gait belt Activity Tolerance: Patient tolerated treatment well Patient left: in chair;with call bell/phone within reach Nurse Communication: Mobility status   Milana Kidney 04/11/2012, 10:45 AM  04/11/2012 Milana Kidney DPT PAGER: [phone] OFFICE: [phone]

## 2012-04-11 NOTE — Progress Notes (Signed)
Occupational Therapy Evaluation Patient Details Name: Donna Garza MRN: [id number] DOB: [birth year] Today's Date: 04/11/2012 Time: 6962-9528 OT Time Calculation (min): 23 min  OT Assessment / Plan / Recommendation Clinical Impression  Pt s/p BYPASS GRAFT FEMORAL-POPLITEAL ARTERY (Right) - Right Femoral to Below Knee Popliteal Bypass with Vein thus affecting PLOF. Will benefit from acute OT services to address below problem list.  Anticipate that pt will not need DME at d/c but will determine at next session. Will benefit from acute OT services to address below problem list.     OT Assessment  Patient needs continued OT Services    Follow Up Recommendations  No OT follow up;Supervision/Assistance - 24 hour (24/7 initally)    Barriers to Discharge      Equipment Recommendations   (TBD by OT)    Recommendations for Other Services    Frequency  Min 2X/week    Precautions / Restrictions     Pertinent Vitals/Pain Prior to OT arrival, pt with low BP (88/45) and HR 74.  Monitored pt's vitals throughout session and pt remained stable.  147/73 in supine. 147/76 sitting EOB. 151/64 sitting in chair.  RN made aware.    ADL  Lower Body Bathing: Simulated;Minimal assistance Where Assessed - Lower Body Bathing: Supported sit to stand Lower Body Dressing: Simulated;Minimal assistance Where Assessed - Lower Body Dressing: Supported sit to stand Toilet Transfer: Mining engineer Method: Sit to Barista:  (chair) Equipment Used: Gait belt;Rolling walker Transfers/Ambulation Related to ADLs: Min assist with bil HHA.  Min guard-min assist with RW.   Pt able to offset some weight from RLE when using RW. ADL Comments: Decreased independence with standing components of ADLs.    OT Diagnosis: Generalized weakness  OT Problem List: Decreased activity tolerance;Decreased knowledge of use of DME or AE OT Treatment Interventions: Self-care/ADL  training;DME and/or AE instruction;Therapeutic activities;Patient/family education   OT Goals Acute Rehab OT Goals OT Goal Formulation: With patient Time For Goal Achievement: 04/18/12 Potential to Achieve Goals: Good ADL Goals Pt Will Perform Grooming: with modified independence;Standing at sink ADL Goal: Grooming - Progress: Goal set today Pt Will Perform Lower Body Dressing: with modified independence;Sit to stand from chair;Sit to stand from bed ADL Goal: Lower Body Dressing - Progress: Goal set today Pt Will Transfer to Toilet: with modified independence;Ambulation;with DME;Comfort height toilet (DME as needed) ADL Goal: Toilet Transfer - Progress: Goal set today Pt Will Perform Tub/Shower Transfer: Tub transfer;with modified independence;Ambulation;with DME (DME as needed) ADL Goal: Tub/Shower Transfer - Progress: Goal set today  Visit Information  Last OT Received On: 04/11/12 Assistance Needed: +1    Subjective Data      Prior Functioning  Vision/Perception  Home Living Lives With: Alone Available Help at Discharge: Family;Available 24 hours/day (for first week) Type of Home: House Home Access: Stairs to enter [employer] of Steps: 5 Entrance Stairs-Rails: Left Home Layout: One level Bathroom Shower/Tub: Forensic scientist: Standard Bathroom Accessibility: Yes How Accessible: Accessible via walker Home Adaptive Equipment: Dan Humphreys - four wheeled Prior Function Level of Independence: Independent Driving: Yes Vocation: 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      Cognition  Overall Cognitive Status: Appears within functional limits for tasks assessed/performed Arousal/Alertness: Awake/alert Orientation Level: Appears intact for tasks assessed Behavior During Session: [facility] for tasks performed    Extremity/Trunk Assessment Right Upper Extremity Assessment RUE ROM/Strength/Tone: Within functional  levels Left Upper Extremity Assessment LUE ROM/Strength/Tone: Within functional levels  Mobility  Shoulder Instructions  Bed Mobility Bed Mobility: Supine to Sit;Sitting - Scoot to Edge of Bed Supine to Sit: 5: Supervision Sitting - Scoot to Edge of Bed: 5: Supervision Details for Bed Mobility Assistance: Supervision for safety and to monitor BP Transfers Transfers: Sit to Stand;Stand to Sit Sit to Stand: 4: Min assist;From bed;With upper extremity assist;From chair/3-in-1 Stand to Sit: 4: Min guard;To chair/3-in-1;With armrests;With upper extremity assist Details for Transfer Assistance: Verbal cueing for safest hand placement.       Exercise     Balance     End of Session OT - End of Session Equipment Utilized During Treatment: Gait belt Activity Tolerance: Patient tolerated treatment well Patient left: in chair;with call bell/phone within reach Nurse Communication: Mobility status  GO    04/11/2012 Cipriano Mile OTR/L Pager [phone] Office [phone]  Cipriano Mile 04/11/2012, 9:42 AM

## 2012-04-12 NOTE — Progress Notes (Signed)
Patient discharge instructions reviewed, all questions answered, prescription given. Patient discharged home.

## 2012-04-12 NOTE — Discharge Summary (Signed)
[facility] Discharge Summary  Donna Garza Feb 18, 1941 71 y.o. female  [id number]  Admission Date: 04/10/2012  Discharge Date: 04/12/12  Physician: Pryor Ochoa, MD  Admission Diagnosis: Peripheral Vascular Disease   HPI:   This is a 71 y.o. female was referred by Dr. Fara Chute for claudication symptoms in the right leg. Patient states that for the past year or so she has been having cramping in the right calf when she ambulates significant distances. This has worsened over the past several months. She is able to walk about one half to one block before severe cramping in the right calf occurs and shortly thereafter she must stop. She has mild symptoms in the contralateral left leg. She has no history of rest pain, nonhealing ulcers, infection, gangrene, or other problems. She denies a history of vascular problems such as coronary artery disease or stroke. She states symptoms are now beginning to limit her significantly. She will occasionally awaken at night with leg cramps but no rest pain in the feet.  Hospital Course:  The patient was admitted to the hospital and taken to the operating room on 04/10/2012 and underwent Right common femoral to popliteal-we-bypass using a nonreversible translocated saphenous vein graft from right leg  INTRA OPERATIVE ARTERIOGRAM right leg .  The pt tolerated the procedure well and was transported to the PACU in good condition. By POD 1, she was doing well.  She was given morphine and then the nurses got her up to the chair and she had an episode of dizziness and hypotension with systolic in the 80s.  She was put back to bed and her BP recovered nicely.  She did not have any other events post operatively.  The remainder of the hospital course consisted of increasing mobilization and increasing intake of solids without difficulty.  CBC    Component Value Date/Time   WBC 10.0 04/11/2012 0415   RBC 4.23 04/11/2012 0415   HGB 11.3* 04/11/2012  0415   HCT 34.4* 04/11/2012 0415   PLT 226 04/11/2012 0415   MCV 81.3 04/11/2012 0415   MCH 26.7 04/11/2012 0415   MCHC 32.8 04/11/2012 0415   RDW 13.6 04/11/2012 0415    BMET    Component Value Date/Time   NA 135 04/11/2012 0415   K 4.0 04/11/2012 0415   CL 98 04/11/2012 0415   CO2 27 04/11/2012 0415   GLUCOSE 138* 04/11/2012 0415   BUN 9 04/11/2012 0415   CREATININE 0.77 04/11/2012 0415   CALCIUM 9.3 04/11/2012 0415   GFRNONAA 83* 04/11/2012 0415   GFRAA >90 04/11/2012 0415     Discharge Instructions:   The patient is discharged to home with extensive instructions on wound care and progressive ambulation.  They are instructed not to drive or perform any heavy lifting until returning to see the physician in his office.  Discharge Orders    Future Appointments: Provider: Department: Dept Phone: Center:   04/27/2012 7:45 AM Sherrie George, MD [facility] [phone] None   04/28/2012 2:15 PM Pryor Ochoa, MD [facility] [phone] [facility]     Future Orders Please Complete By Expires   Resume previous diet      Driving Restrictions      Comments:   No driving for 2 weeks   Lifting restrictions      Comments:   No lifting for 6 weeks   Call MD for:  temperature >100.5      Call MD for:  redness,  tenderness, or signs of infection (pain, swelling, bleeding, redness, odor or green/yellow discharge around incision site)      Call MD for:  severe or increased pain, loss or decreased feeling  in affected limb(s)      Discharge wound care:      Comments:   Shower daily with soap and water starting 04/12/12      Discharge Diagnosis:  Peripheral Vascular Disease  Secondary Diagnosis: Patient Active Problem List  Diagnosis  . GERD (gastroesophageal reflux disease)  . Adenomatous colon polyp  . PAD (peripheral artery disease)   Past Medical History  Diagnosis Date  . HTN (hypertension)   . GERD (gastroesophageal reflux disease)   . Hx of adenomatous colonic polyps     Last colonoscopy  2007. Benign polyp at that time. Tubular adenoma found at colonoscopy in 2004.  [name] Peripheral vascular disease       Casondra, G[place]a  Home Medication Instructions ZOX:[id number]   Printed on:04/12/12 8119  Medication Information                    omeprazole (PRILOSEC) 20 MG capsule Take 20 mg by mouth daily.            pravastatin (PRAVACHOL) 20 MG tablet Take 20 mg by mouth at bedtime.            lisinopril (PRINIVIL,ZESTRIL) 20 MG tablet Take 20 mg by mouth at bedtime.            oxyCODONE (ROXICODONE) 5 MG immediate release tablet Take 1 tablet (5 mg total) by mouth every 6 (six) hours as needed for pain. #30 NR            Disposition: home  Patient's condition: is Good  Follow up: 1. Dr. Hart Rochester in 2 weeks   Doreatha Massed, PA-C [facility] [phone] 04/12/2012  9:52 AM

## 2012-04-12 NOTE — Progress Notes (Addendum)
[facility] Progress Note  04/12/2012 8:26 AM POD 2  Subjective:  No complaints; has had no more episodes of lightheadedness or dizziness.  Tm 100.8 now 98.8 Otherwise VSS 96%RA Filed Vitals:   04/12/12 0510  BP: 143/61  Pulse: 74  Temp: 98.8 F (37.1 C)  Resp: 20    Physical Exam: Incisions:  C/d/i Extremities:  + palpable right DP  CBC    Component Value Date/Time   WBC 10.0 04/11/2012 0415   RBC 4.23 04/11/2012 0415   HGB 11.3* 04/11/2012 0415   HCT 34.4* 04/11/2012 0415   PLT 226 04/11/2012 0415   MCV 81.3 04/11/2012 0415   MCH 26.7 04/11/2012 0415   MCHC 32.8 04/11/2012 0415   RDW 13.6 04/11/2012 0415    BMET    Component Value Date/Time   NA 135 04/11/2012 0415   K 4.0 04/11/2012 0415   CL 98 04/11/2012 0415   CO2 27 04/11/2012 0415   GLUCOSE 138* 04/11/2012 0415   BUN 9 04/11/2012 0415   CREATININE 0.77 04/11/2012 0415   CALCIUM 9.3 04/11/2012 0415   GFRNONAA 83* 04/11/2012 0415   GFRAA >90 04/11/2012 0415    INR    Component Value Date/Time   INR 0.99 04/01/2012 0830     Intake/Output Summary (Last 24 hours) at 04/12/12 0826 Last data filed at 04/11/12 1300  Gross per 24 hour  Intake      0 ml  Output    600 ml  Net   -600 ml     Assessment/Plan:  71 y.o. female is s/p Right common femoral to popliteal-we-bypass using a nonreversible translocated saphenous vein graft from right leg  POD 2  -pt doing well this am. -patent bypass graft. -has ambulated to restroom-increase mobilization today. -home soon.  Doreatha Massed, PA-C [facility] [phone] 04/12/2012 8:26 AM   Agree with above Right leg wounds look good No distal edema 2+ dorsalis pedis pulse palpable  Patient will ambulate again is morning and it feels okay discharge home return to see me in 2 weeks-doing well

## 2012-04-12 NOTE — Progress Notes (Signed)
Physical Therapy Treatment Patient Details Name: Donna Garza MRN: [id number] DOB: July 11, 1941 Today's Date: 04/12/2012 Time: 1015-1026 PT Time Calculation (min): 11 min  PT Assessment / Plan / Recommendation Comments on Treatment Session  Patient did very well with ambulation and stairs today.    Follow Up Recommendations  No PT follow up;Supervision for mobility/OOB    Barriers to Discharge        Equipment Recommendations  None recommended by PT    Recommendations for Other Services    Frequency Min 4X/week   Plan Discharge plan remains appropriate;Frequency remains appropriate    Precautions / Restrictions Precautions Precautions: None Restrictions Weight Bearing Restrictions: No       Mobility  Bed Mobility Bed Mobility: Supine to Sit Supine to Sit: 7: Independent;HOB flat Details for Bed Mobility Assistance: No cues or assist needed Transfers Transfers: Sit to Stand;Stand to Sit Sit to Stand: 6: Modified independent (Device/Increase time);With upper extremity assist;From bed Stand to Sit: 6: Modified independent (Device/Increase time);With upper extremity assist;To bed Details for Transfer Assistance: Patient uses safe technique. Ambulation/Gait Ambulation/Gait Assistance: 6: Modified independent (Device/Increase time) Ambulation Distance (Feet): 250 Feet Assistive device: Rolling walker Ambulation/Gait Assistance Details: Good gait pattern and technique. Able to put weight on RLE today.   Gait Pattern: Step-through pattern;Decreased stride length Gait velocity: decreased gait speed Stairs: Yes Stairs Assistance: 4: Min guard Stairs Assistance Details (indicate cue type and reason): Instructed patient on safe step-to technique.  Also instructed patient on how to have her daughter assist her on stairs. Stair Management Technique: Two rails;Step to pattern;Forwards Number of Stairs: 5       PT Goals Acute Rehab PT Goals PT Goal: Supine/Side to Sit -  Progress: Met PT Goal: Sit to Stand - Progress: Met PT Goal: Stand to Sit - Progress: Met PT Goal: Ambulate - Progress: Met PT Goal: Up/Down Stairs - Progress: Progressing toward goal  Visit Information  Last PT Received On: 04/12/12 Assistance Needed: +1    Subjective Data  Subjective: "I'm going home today."   Cognition  Overall Cognitive Status: Appears within functional limits for tasks assessed/performed Arousal/Alertness: Awake/alert Orientation Level: Oriented X4 / Intact Behavior During Session: [facility] for tasks performed    Balance     End of Session PT - End of Session Equipment Utilized During Treatment: Gait belt Activity Tolerance: Patient tolerated treatment well Patient left: in chair;with call bell/phone within reach Nurse Communication: Mobility status   GP     Vena Austria 04/12/2012, 1:21 PM Durenda Hurt. Renaldo Fiddler, [facility] Acute Rehab Services Pager [phone]

## 2012-04-12 NOTE — Plan of Care (Signed)
Problem: Phase I Progression Outcomes Goal: Hemodynamically stable Outcome: Completed/Met Date Met:  04/12/12 Vital signs stable. Denies pain. Received Tylenol for fever. Seem to relieve fever. Goal: Initial discharge plan identified Outcome: Completed/Met Date Met:  04/12/12 Pt will be discharged home alone. Pt ready to go home.  Problem: Phase III Progression Outcomes Goal: Graft patent without s/s of occlusion Outcome: Completed/Met Date Met:  04/12/12 Pt denies pain at surgical site. No observed hematoma or edema or bruising. Goal: Wound healing without s/s of infection Outcome: Progressing No signs or symptoms of infection. Goal: Tolerates increased activity without assistance Outcome: Completed/Met Date Met:  04/12/12 Ambulates independently within room and hallway. Tolerates fine. Goal: Pain controlled on oral analgesia Outcome: Completed/Met Date Met:  04/12/12 Pain well controled with meds. Pain at patients tolerable level. Will continue to monitor and intervene if necessary.    Goal: Tolerating diet Outcome: Completed/Met Date Met:  04/12/12 Denies nausea or vomiting. Tolerates current diet. Fair appetite per pt.

## 2012-04-13 ENCOUNTER — Encounter (HOSPITAL_COMMUNITY): Payer: Self-pay | Admitting: Vascular Surgery

## 2012-04-22 DIAGNOSIS — L570 Actinic keratosis: Secondary | ICD-10-CM | POA: Diagnosis not present

## 2012-04-22 DIAGNOSIS — D1800 Hemangioma unspecified site: Secondary | ICD-10-CM | POA: Diagnosis not present

## 2012-04-22 DIAGNOSIS — L821 Other seborrheic keratosis: Secondary | ICD-10-CM | POA: Diagnosis not present

## 2012-04-27 ENCOUNTER — Encounter: Payer: Self-pay | Admitting: Vascular Surgery

## 2012-04-27 ENCOUNTER — Encounter (INDEPENDENT_AMBULATORY_CARE_PROVIDER_SITE_OTHER): Payer: Medicare Other | Admitting: Ophthalmology

## 2012-04-27 DIAGNOSIS — H348392 Tributary (branch) retinal vein occlusion, unspecified eye, stable: Secondary | ICD-10-CM | POA: Diagnosis not present

## 2012-04-27 DIAGNOSIS — H35039 Hypertensive retinopathy, unspecified eye: Secondary | ICD-10-CM

## 2012-04-27 DIAGNOSIS — H43819 Vitreous degeneration, unspecified eye: Secondary | ICD-10-CM

## 2012-04-27 DIAGNOSIS — H35379 Puckering of macula, unspecified eye: Secondary | ICD-10-CM | POA: Diagnosis not present

## 2012-04-27 DIAGNOSIS — H35369 Drusen (degenerative) of macula, unspecified eye: Secondary | ICD-10-CM

## 2012-04-27 DIAGNOSIS — I10 Essential (primary) hypertension: Secondary | ICD-10-CM | POA: Diagnosis not present

## 2012-04-27 DIAGNOSIS — H2510 Age-related nuclear cataract, unspecified eye: Secondary | ICD-10-CM

## 2012-04-27 DIAGNOSIS — D3130 Benign neoplasm of unspecified choroid: Secondary | ICD-10-CM

## 2012-04-28 ENCOUNTER — Encounter: Payer: Self-pay | Admitting: Vascular Surgery

## 2012-04-28 ENCOUNTER — Ambulatory Visit (INDEPENDENT_AMBULATORY_CARE_PROVIDER_SITE_OTHER): Payer: Medicare Other | Admitting: Vascular Surgery

## 2012-04-28 VITALS — BP 154/84 | HR 66 | Resp 18 | Ht 66.0 in | Wt 159.0 lb

## 2012-04-28 DIAGNOSIS — I739 Peripheral vascular disease, unspecified: Secondary | ICD-10-CM | POA: Insufficient documentation

## 2012-04-28 DIAGNOSIS — Z48812 Encounter for surgical aftercare following surgery on the circulatory system: Secondary | ICD-10-CM

## 2012-04-28 NOTE — Progress Notes (Signed)
Subjective:     Patient ID: Donna Garza, female   DOB: [birth year], 71 y.o.   MRN: [id number]  HPI this 71 year old female is 2 weeks status post right femoral-popliteal saphenous vein graft for severe claudication. She has done well with no severe pain and mild edema distally which is improving. She states her claudication symptoms are much much better.  Review of Systems     Objective:   Physical ExamBP 154/84  Pulse 66  Resp 18  Ht 5\' 6"  (1.676 m)  Wt 159 lb (72.122 kg)  BMI 25.66 kg/m2  General well-developed well-nourished female in no apparent distress alert and oriented x3 Right leg with well healing incisions in the right lower extremity. 3+ femoral 2+ popliteal and 2+ dorsalis pedis pulse palpable. 1+ distal edema. Right foot well-perfused.     Assessment:     Doing well 2 weeks post right femoral-popliteal saphenous vein graft from limiting claudication    Plan:     Return in 3 months with duplex scan of bypass and ABIs Elevate leg at night and intermittently during the day

## 2012-04-28 NOTE — Addendum Note (Signed)
Addended by: Sharee Pimple on: 04/28/2012 04:06 PM   Modules accepted: Orders

## 2012-06-01 ENCOUNTER — Encounter (INDEPENDENT_AMBULATORY_CARE_PROVIDER_SITE_OTHER): Payer: Medicare Other | Admitting: Ophthalmology

## 2012-06-01 DIAGNOSIS — H2510 Age-related nuclear cataract, unspecified eye: Secondary | ICD-10-CM

## 2012-06-01 DIAGNOSIS — H43819 Vitreous degeneration, unspecified eye: Secondary | ICD-10-CM

## 2012-06-01 DIAGNOSIS — H35039 Hypertensive retinopathy, unspecified eye: Secondary | ICD-10-CM

## 2012-06-01 DIAGNOSIS — H35379 Puckering of macula, unspecified eye: Secondary | ICD-10-CM

## 2012-06-01 DIAGNOSIS — I10 Essential (primary) hypertension: Secondary | ICD-10-CM

## 2012-06-01 DIAGNOSIS — D3130 Benign neoplasm of unspecified choroid: Secondary | ICD-10-CM

## 2012-06-01 DIAGNOSIS — H348392 Tributary (branch) retinal vein occlusion, unspecified eye, stable: Secondary | ICD-10-CM

## 2012-07-10 DIAGNOSIS — E7800 Pure hypercholesterolemia, unspecified: Secondary | ICD-10-CM | POA: Diagnosis not present

## 2012-07-10 DIAGNOSIS — I10 Essential (primary) hypertension: Secondary | ICD-10-CM | POA: Diagnosis not present

## 2012-07-14 ENCOUNTER — Encounter (INDEPENDENT_AMBULATORY_CARE_PROVIDER_SITE_OTHER): Payer: Medicare Other | Admitting: Ophthalmology

## 2012-07-14 DIAGNOSIS — H35379 Puckering of macula, unspecified eye: Secondary | ICD-10-CM

## 2012-07-14 DIAGNOSIS — D3130 Benign neoplasm of unspecified choroid: Secondary | ICD-10-CM

## 2012-07-14 DIAGNOSIS — H348392 Tributary (branch) retinal vein occlusion, unspecified eye, stable: Secondary | ICD-10-CM

## 2012-07-14 DIAGNOSIS — H43819 Vitreous degeneration, unspecified eye: Secondary | ICD-10-CM

## 2012-07-14 DIAGNOSIS — H35039 Hypertensive retinopathy, unspecified eye: Secondary | ICD-10-CM

## 2012-07-14 DIAGNOSIS — I10 Essential (primary) hypertension: Secondary | ICD-10-CM

## 2012-07-14 DIAGNOSIS — H2510 Age-related nuclear cataract, unspecified eye: Secondary | ICD-10-CM

## 2012-07-21 DIAGNOSIS — I10 Essential (primary) hypertension: Secondary | ICD-10-CM | POA: Diagnosis not present

## 2012-07-21 DIAGNOSIS — I739 Peripheral vascular disease, unspecified: Secondary | ICD-10-CM | POA: Diagnosis not present

## 2012-07-21 DIAGNOSIS — E7800 Pure hypercholesterolemia, unspecified: Secondary | ICD-10-CM | POA: Diagnosis not present

## 2012-08-03 ENCOUNTER — Encounter: Payer: Self-pay | Admitting: Vascular Surgery

## 2012-08-04 ENCOUNTER — Encounter: Payer: Self-pay | Admitting: Vascular Surgery

## 2012-08-04 ENCOUNTER — Encounter (INDEPENDENT_AMBULATORY_CARE_PROVIDER_SITE_OTHER): Payer: Medicare Other | Admitting: *Deleted

## 2012-08-04 ENCOUNTER — Ambulatory Visit (INDEPENDENT_AMBULATORY_CARE_PROVIDER_SITE_OTHER): Payer: Medicare Other | Admitting: Vascular Surgery

## 2012-08-04 VITALS — BP 156/81 | HR 69 | Resp 18 | Ht 66.0 in | Wt 162.0 lb

## 2012-08-04 DIAGNOSIS — I739 Peripheral vascular disease, unspecified: Secondary | ICD-10-CM

## 2012-08-04 DIAGNOSIS — Z48812 Encounter for surgical aftercare following surgery on the circulatory system: Secondary | ICD-10-CM

## 2012-08-04 NOTE — Progress Notes (Signed)
Subjective:     Patient ID: Donna Garza, female   DOB: [birth year], 71 y.o.   MRN: [id number]  HPI this 71 year old female returns 3 months post right femoral-popliteal bypass graft done for severe claudication. Her claudication symptoms are completely resolved. She does have some residual edema but this is improving. She has no symptoms in the contralateral left leg.  Past Medical History  Diagnosis Date  . HTN (hypertension)   . GERD (gastroesophageal reflux disease)   . Hx of adenomatous colonic polyps     Last colonoscopy 2007. Benign polyp at that time. Tubular adenoma found at colonoscopy in 2004.  [name] Peripheral vascular disease     History  Substance Use Topics  . Smoking status: Never Smoker   . Smokeless tobacco: Never Used  . Alcohol Use: No    Family History  Problem Relation Age of Onset  . Colon cancer Neg Hx   . Breast cancer Mother   . Breast cancer Daughter   . Heart attack Father     Allergies  Allergen Reactions  . Diphenhydramine Other (See Comments)    "knocks me Completely out"    Current outpatient prescriptions:lisinopril (PRINIVIL,ZESTRIL) 20 MG tablet, Take 10 mg by mouth at bedtime. , Disp: , Rfl: ;  omeprazole (PRILOSEC) 20 MG capsule, Take 20 mg by mouth daily. , Disp: , Rfl: ;  pravastatin (PRAVACHOL) 20 MG tablet, Take 20 mg by mouth at bedtime. , Disp: , Rfl:   BP 156/81  Pulse 69  Resp 18  Ht 5\' 6"  (1.676 m)  Wt 162 lb (73.483 kg)  BMI 26.15 kg/m2  Body mass index is 26.15 kg/(m^2).        Review of Systems denies chest pain, dyspnea on exertion, PND, orthopnea, hemoptysis    Objective:   Physical Exam blood pressure 26/81 heart rate 69 respirations 18 General well-developed well-nourished female in no apparent stress alert and oriented x3 Lungs no rhonchi or wheezing Right lower extremity with 3+ femoral pulse palpable. Right foot well perfused. Left lower extremity with 3+ femoral pulse palpable and well perfused lower  extremity  Today I ordered lower extremity duplex scan of the right femoral-popliteal graft which reveals a concentric narrowing at the very distal end of the vein graft with a velocity of 420 cm/s suggestive of the vein graft stenosis. Review of the intraoperative angiogram did not reveal this at that time    Assessment:     3 months post right femoral-popliteal vein graft for claudication-possible distal vein graft stenosis    Plan:     We'll perform angiography later this month to rule out significant right femoral-popliteal vein graft stenosis with possible PTA of this area-scheduled for Dr. [name] on Tuesday, January 21

## 2012-08-13 ENCOUNTER — Other Ambulatory Visit: Payer: Self-pay

## 2012-08-18 ENCOUNTER — Encounter (HOSPITAL_COMMUNITY): Payer: Self-pay | Admitting: Pharmacy Technician

## 2012-09-01 ENCOUNTER — Other Ambulatory Visit: Payer: Self-pay | Admitting: *Deleted

## 2012-09-01 ENCOUNTER — Ambulatory Visit (HOSPITAL_COMMUNITY)
Admission: RE | Admit: 2012-09-01 | Discharge: 2012-09-01 | Disposition: A | Discharge status: Home / Self Care | Payer: Medicare Other | Source: Ambulatory Visit | Attending: Surgery | Admitting: Surgery

## 2012-09-01 ENCOUNTER — Telehealth: Payer: Self-pay | Admitting: Surgery

## 2012-09-01 ENCOUNTER — Encounter (HOSPITAL_COMMUNITY)
Admission: RE | Disposition: A | Discharge status: Home / Self Care | Payer: Self-pay | Source: Ambulatory Visit | Attending: Surgery

## 2012-09-01 DIAGNOSIS — I10 Essential (primary) hypertension: Secondary | ICD-10-CM | POA: Diagnosis not present

## 2012-09-01 DIAGNOSIS — T82898A Other specified complication of vascular prosthetic devices, implants and grafts, initial encounter: Secondary | ICD-10-CM | POA: Insufficient documentation

## 2012-09-01 DIAGNOSIS — Y832 Surgical operation with anastomosis, bypass or graft as the cause of abnormal reaction of the patient, or of later complication, without mention of misadventure at the time of the procedure: Secondary | ICD-10-CM | POA: Insufficient documentation

## 2012-09-01 DIAGNOSIS — Z48812 Encounter for surgical aftercare following surgery on the circulatory system: Secondary | ICD-10-CM

## 2012-09-01 DIAGNOSIS — I70219 Atherosclerosis of native arteries of extremities with intermittent claudication, unspecified extremity: Secondary | ICD-10-CM

## 2012-09-01 HISTORY — PX: LOWER EXTREMITY ANGIOGRAM: SHX5508

## 2012-09-01 LAB — POCT I-STAT, CHEM 8
Calcium, Ion: 1.2 mmol/L (ref 1.13–1.30)
Creatinine, Ser: 0.9 mg/dL (ref 0.50–1.10)
Glucose, Bld: 113 mg/dL — ABNORMAL HIGH (ref 70–99)
HCT: 38 % (ref 36.0–46.0)
Hemoglobin: 12.9 g/dL (ref 12.0–15.0)

## 2012-09-01 LAB — POCT ACTIVATED CLOTTING TIME: Activated Clotting Time: 170 seconds

## 2012-09-01 SURGERY — ANGIOGRAM, LOWER EXTREMITY
Anesthesia: LOCAL | Laterality: Right

## 2012-09-01 MED ORDER — SODIUM CHLORIDE 0.9 % IV SOLN
INTRAVENOUS | Status: DC
  Administered 2012-09-01: 06:00:00 via INTRAVENOUS

## 2012-09-01 MED ORDER — ACETAMINOPHEN 325 MG RE SUPP: 325.0000 mg | RECTAL | Status: DC | PRN

## 2012-09-01 MED ORDER — SODIUM CHLORIDE 0.9 % IV SOLN: 1.0000 mL/kg/h | INTRAVENOUS | Status: DC

## 2012-09-01 MED ORDER — ACETAMINOPHEN 325 MG PO TABS: 325.0000 mg | ORAL_TABLET | ORAL | Status: DC | PRN

## 2012-09-01 MED ORDER — ALUM & MAG HYDROXIDE-SIMETH 200-200-20 MG/5ML PO SUSP: 15.0000 mL | ORAL | Status: DC | PRN

## 2012-09-01 MED ORDER — PHENOL 1.4 % MT LIQD: 1.0000 | OROMUCOSAL | Status: DC | PRN

## 2012-09-01 MED ORDER — ONDANSETRON HCL 4 MG/2ML IJ SOLN: 4.0000 mg | Freq: Four times a day (QID) | INTRAMUSCULAR | Status: DC | PRN

## 2012-09-01 MED ORDER — HEPARIN SODIUM (PORCINE) 1000 UNIT/ML IJ SOLN
INTRAMUSCULAR | Status: AC
  Filled 2012-09-01: qty 1

## 2012-09-01 MED ORDER — MORPHINE SULFATE 10 MG/ML IJ SOLN: 2.0000 mg | INTRAMUSCULAR | Status: DC | PRN

## 2012-09-01 MED ORDER — GUAIFENESIN-DM 100-10 MG/5ML PO SYRP: 15.0000 mL | ORAL_SOLUTION | ORAL | Status: DC | PRN

## 2012-09-01 MED ORDER — OXYCODONE-ACETAMINOPHEN 5-325 MG PO TABS: 1.0000 | ORAL_TABLET | ORAL | Status: DC | PRN

## 2012-09-01 MED ORDER — LABETALOL HCL 5 MG/ML IV SOLN: 10.0000 mg | INTRAVENOUS | Status: DC | PRN

## 2012-09-01 MED ORDER — LIDOCAINE HCL (PF) 1 % IJ SOLN
INTRAMUSCULAR | Status: AC
  Filled 2012-09-01: qty 30

## 2012-09-01 MED ORDER — HYDRALAZINE HCL 20 MG/ML IJ SOLN: 10.0000 mg | INTRAMUSCULAR | Status: DC | PRN

## 2012-09-01 MED ORDER — METOPROLOL TARTRATE 1 MG/ML IV SOLN: 2.0000 mg | INTRAVENOUS | Status: DC | PRN

## 2012-09-01 NOTE — Telephone Encounter (Addendum)
Message copied by Rosalyn Charters on Tue Sep 01, 2012 11:01 AM ------      Message from: [name]      Created: Tue Sep 01, 2012  9:25 AM      Regarding: f/u appt                   ----- Message -----         From: Nada Libman, MD         Sent: 09/01/2012   8:50 AM           To: Reuel Derby, Melene Plan, RN, #            09/01/2012:            Procedure Performed:       1.  ultrasound-guided access, left femoral artery       2.  abdominal aortogram       3.  right lower extremity runoff       4.  third order catheterization       5.  balloon angioplasty right femoral popliteal artery bypass graft at the below knee popliteal artery anastomosis       6.  followup x2                  Please schedule the patient to come back and see Dr. Hart Rochester in 3 months with a duplex ultrasound of her bypass graft of the right leg  notified patient of fu appt. with dr. Hart Rochester on 11-30-12 at 3 pm

## 2012-09-01 NOTE — Progress Notes (Signed)
UP AND WALKED AND TOL WELL; RIGHT GROIN STABLE; NO BLEEDING OR HEMATOMA 

## 2012-09-01 NOTE — H&P (View-Only) (Signed)
Subjective:     Patient ID: Donna Garza, female   DOB: 09/10/1940, 71 y.o.   MRN: [id number]  HPI this 71-year-old female returns 3 months post right femoral-popliteal bypass graft done for severe claudication. Her claudication symptoms are completely resolved. She does have some residual edema but this is improving. She has no symptoms in the contralateral left leg.  Past Medical History  Diagnosis Date  . HTN (hypertension)   . GERD (gastroesophageal reflux disease)   . Hx of adenomatous colonic polyps     Last colonoscopy 2007. Benign polyp at that time. Tubular adenoma found at colonoscopy in 2004.  . Peripheral vascular disease     History  Substance Use Topics  . Smoking status: Never Smoker   . Smokeless tobacco: Never Used  . Alcohol Use: No    Family History  Problem Relation Age of Onset  . Colon cancer Neg Hx   . Breast cancer Mother   . Breast cancer Daughter   . Heart attack Father     Allergies  Allergen Reactions  . Diphenhydramine Other (See Comments)    "knocks me Completely out"    Current outpatient prescriptions:lisinopril (PRINIVIL,ZESTRIL) 20 MG tablet, Take 10 mg by mouth at bedtime. , Disp: , Rfl: ;  omeprazole (PRILOSEC) 20 MG capsule, Take 20 mg by mouth daily. , Disp: , Rfl: ;  pravastatin (PRAVACHOL) 20 MG tablet, Take 20 mg by mouth at bedtime. , Disp: , Rfl:   BP 156/81  Pulse 69  Resp 18  Ht 5' 6" (1.676 m)  Wt 162 lb (73.483 kg)  BMI 26.15 kg/m2  Body mass index is 26.15 kg/(m^2).        Review of Systems denies chest pain, dyspnea on exertion, PND, orthopnea, hemoptysis    Objective:   Physical Exam blood pressure 26/81 heart rate 69 respirations 18 General well-developed well-nourished female in no apparent stress alert and oriented x3 Lungs no rhonchi or wheezing Right lower extremity with 3+ femoral pulse palpable. Right foot well perfused. Left lower extremity with 3+ femoral pulse palpable and well perfused lower  extremity  Today I ordered lower extremity duplex scan of the right femoral-popliteal graft which reveals a concentric narrowing at the very distal end of the vein graft with a velocity of 420 cm/s suggestive of the vein graft stenosis. Review of the intraoperative angiogram did not reveal this at that time    Assessment:     3 months post right femoral-popliteal vein graft for claudication-possible distal vein graft stenosis    Plan:     We'll perform angiography later this month to rule out significant right femoral-popliteal vein graft stenosis with possible PTA of this area-scheduled for Dr. Brabham on Tuesday, January 21      

## 2012-09-01 NOTE — Op Note (Signed)
[facility] [place]  Patient name: Donna Garza MRN: [id number] DOB: [birth year] Sex: female  09/01/2012 Pre-operative Diagnosis: Bypass graft stenosis, right leg Post-operative diagnosis:  Same Surgeon:  Jorge Ny Procedure Performed:  1.  ultrasound-guided access, left femoral artery  2.  abdominal aortogram  3.  right lower extremity runoff  4.  third order catheterization  5.  balloon angioplasty right femoral popliteal artery bypass graft at the below knee popliteal artery anastomosis  6.  followup x2    Indications:  The patient is 3 months status post right femoral to below knee popliteal artery bypass graft using vein. This was done for claudication. Ultrasound surveillance revealed elevated velocities at the distal anastomosis. The patient is asymptomatic. She comes in for further evaluation and possible intervention.  Procedure:  The patient was identified in the holding area and taken to room 8.  The patient was then placed supine on the table and prepped and draped in the usual sterile fashion.  A time out was called.  Ultrasound was used to evaluate the left common femoral artery.  It was patent .  A digital ultrasound image was acquired.  A micropuncture needle was used to access the left common femoral artery under ultrasound guidance.  An 018 wire was advanced without resistance and a micropuncture sheath was placed.  The 018 wire was removed and a benson wire was placed.  The micropuncture sheath was exchanged for a 5 french sheath.  An omniflush catheter was advanced over the wire to the level of L-1.  An abdominal angiogram was obtained.  Next, using the omniflush catheter and a benson wire, the aortic bifurcation was crossed and the catheter was placed into theright external iliac artery and right runoff was obtained.  Findings:   Aortogram:  The visualized portions of the abdominal aorta showed no significant disease. There is no  significant renal artery stenosis. The infrarenal abdominal aorta has small areas of irregularity without stenosis or aneurysmal degeneration. Bilateral common and external iliac arteries are widely patent.  Right Lower Extremity:  The right common femoral artery is widely patent. There is possibly a stenosis in the proximal profunda femoral artery. There is a bypass graft originating from the distal common femoral artery. The proximal anastomosis is widely patent. The bypass graft is widely patent throughout the upper leg. Just above the distal anastomosis there is luminal narrowing of approximately 50%. The anastomosis itself was widely patent. Initially there is three-vessel runoff to the ankle however the anterior tibial artery is the only vessel that crosses the ankle   Intervention:  After the above images were obtained the decision was made to intervene. Over a 035 wire, a 6 [place] An[place] 1 sheath was placed into the right external iliac artery. At this point the patient was fully heparinized. I used a Kumpe catheter and a 014 [place] core wire to gain access into the bypass graft. The wire was advanced across the distal anastomosis without difficulty. I then proceeded with primary balloon angioplasty. Initially I used a 2 x 2 balloon. This was taken to burst pressure and held for 1 minute. Followup angiography revealed suboptimal results, and therefore I elected to up size to a 3 mm balloon. This again was taken to burst pressure and held up for 1 minute. Followup angiography revealed improved but soft optimal results, and therefore I elected to again increase to a 4 mm balloon. I took this balloon to 9 atmospheres which was just  below nominal pressure. This correlated to a 3.9 diameter balloon. This was held for 1 minute. Followup angiography revealed satisfactory results. At this point the decision was made to terminate the procedure. Catheters and wires were removed. The sheath was pulled back into the  left external iliac artery. The patient was taken to the holding area for sheath pull once her coagulation profile corrects.  Impression:  #1  hemodynamically significant stenosis at the distal femoral to below knee popliteal artery bypass graft. This was successfully dilated using sequential balloons, the largest of which was 4 mm which was taken to just below nominal pressure, correlating with a diameter of the balloon of 3.9 mm.   Juleen China, M.D. [facility] [place] Office: [phone] Pager:  [phone]

## 2012-09-01 NOTE — Interval H&P Note (Signed)
History and Physical Interval Note:  09/01/2012 7:39 AM  Donna Garza  has presented today for surgery, with the diagnosis of possible right fem pop graft/vein graft stenosis  The various methods of treatment have been discussed with the patient and family. After consideration of risks, benefits and other options for treatment, the patient has consented to  Procedure(s) (LRB) with comments: LOWER EXTREMITY ANGIOGRAM (Right) as a surgical intervention .  The patient's history has been reviewed, patient examined, no change in status, stable for surgery.  I have reviewed the patient's chart and labs.  Questions were answered to the patient's satisfaction.     Donna Garza IV, V. [facility]

## 2012-09-03 ENCOUNTER — Telehealth: Payer: Self-pay | Admitting: Surgery

## 2012-09-03 NOTE — Telephone Encounter (Signed)
Message copied by Nada Libman on Thu Sep 03, 2012  9:35 PM ------      Message from: [name]      Created: Thu Sep 03, 2012 10:55 AM      Regarding: requests phone call following angiogram      Contact: [phone]                   ----- Message -----         From: [name]         Sent: 09/02/2012   4:08 PM           To: [name], RN            Donna Garza just had an angioplasty done. She was wanting Dr. [name] to call her because she said she never got a chance to speak with him after the Angioplasty. Please call.            Thanks      Donna Garza

## 2012-09-14 ENCOUNTER — Encounter (INDEPENDENT_AMBULATORY_CARE_PROVIDER_SITE_OTHER): Payer: Medicare Other | Admitting: Ophthalmology

## 2012-09-14 DIAGNOSIS — H2510 Age-related nuclear cataract, unspecified eye: Secondary | ICD-10-CM

## 2012-09-14 DIAGNOSIS — H43819 Vitreous degeneration, unspecified eye: Secondary | ICD-10-CM

## 2012-09-14 DIAGNOSIS — I10 Essential (primary) hypertension: Secondary | ICD-10-CM

## 2012-09-14 DIAGNOSIS — H35039 Hypertensive retinopathy, unspecified eye: Secondary | ICD-10-CM

## 2012-09-14 DIAGNOSIS — D3130 Benign neoplasm of unspecified choroid: Secondary | ICD-10-CM

## 2012-09-14 DIAGNOSIS — H348392 Tributary (branch) retinal vein occlusion, unspecified eye, stable: Secondary | ICD-10-CM

## 2012-10-12 ENCOUNTER — Encounter (INDEPENDENT_AMBULATORY_CARE_PROVIDER_SITE_OTHER): Payer: Medicare Other | Admitting: Ophthalmology

## 2012-10-12 DIAGNOSIS — H43819 Vitreous degeneration, unspecified eye: Secondary | ICD-10-CM

## 2012-10-12 DIAGNOSIS — I10 Essential (primary) hypertension: Secondary | ICD-10-CM

## 2012-10-12 DIAGNOSIS — D3130 Benign neoplasm of unspecified choroid: Secondary | ICD-10-CM

## 2012-10-12 DIAGNOSIS — H348392 Tributary (branch) retinal vein occlusion, unspecified eye, stable: Secondary | ICD-10-CM

## 2012-10-12 DIAGNOSIS — H35039 Hypertensive retinopathy, unspecified eye: Secondary | ICD-10-CM

## 2012-10-12 DIAGNOSIS — H35379 Puckering of macula, unspecified eye: Secondary | ICD-10-CM

## 2012-10-12 DIAGNOSIS — H2510 Age-related nuclear cataract, unspecified eye: Secondary | ICD-10-CM

## 2012-10-28 DIAGNOSIS — R300 Dysuria: Secondary | ICD-10-CM | POA: Diagnosis not present

## 2012-11-03 DIAGNOSIS — H35039 Hypertensive retinopathy, unspecified eye: Secondary | ICD-10-CM | POA: Diagnosis not present

## 2012-11-03 DIAGNOSIS — D3130 Benign neoplasm of unspecified choroid: Secondary | ICD-10-CM | POA: Diagnosis not present

## 2012-11-03 DIAGNOSIS — H35379 Puckering of macula, unspecified eye: Secondary | ICD-10-CM | POA: Diagnosis not present

## 2012-11-03 DIAGNOSIS — H2510 Age-related nuclear cataract, unspecified eye: Secondary | ICD-10-CM | POA: Diagnosis not present

## 2012-11-03 DIAGNOSIS — H348392 Tributary (branch) retinal vein occlusion, unspecified eye, stable: Secondary | ICD-10-CM | POA: Diagnosis not present

## 2012-11-03 HISTORY — PX: EYE SURGERY: SHX253

## 2012-11-06 ENCOUNTER — Encounter (INDEPENDENT_AMBULATORY_CARE_PROVIDER_SITE_OTHER): Payer: Medicare Other | Admitting: Ophthalmology

## 2012-11-06 DIAGNOSIS — I10 Essential (primary) hypertension: Secondary | ICD-10-CM | POA: Diagnosis not present

## 2012-11-06 DIAGNOSIS — H43819 Vitreous degeneration, unspecified eye: Secondary | ICD-10-CM

## 2012-11-06 DIAGNOSIS — H35039 Hypertensive retinopathy, unspecified eye: Secondary | ICD-10-CM

## 2012-11-06 DIAGNOSIS — D3130 Benign neoplasm of unspecified choroid: Secondary | ICD-10-CM

## 2012-11-06 DIAGNOSIS — H348392 Tributary (branch) retinal vein occlusion, unspecified eye, stable: Secondary | ICD-10-CM

## 2012-11-06 DIAGNOSIS — H35379 Puckering of macula, unspecified eye: Secondary | ICD-10-CM

## 2012-11-06 DIAGNOSIS — H2510 Age-related nuclear cataract, unspecified eye: Secondary | ICD-10-CM

## 2012-11-12 DIAGNOSIS — R51 Headache: Secondary | ICD-10-CM | POA: Diagnosis not present

## 2012-11-12 DIAGNOSIS — R5383 Other fatigue: Secondary | ICD-10-CM | POA: Diagnosis not present

## 2012-11-12 DIAGNOSIS — R509 Fever, unspecified: Secondary | ICD-10-CM | POA: Diagnosis not present

## 2012-11-12 DIAGNOSIS — R5381 Other malaise: Secondary | ICD-10-CM | POA: Diagnosis not present

## 2012-11-17 DIAGNOSIS — H269 Unspecified cataract: Secondary | ICD-10-CM | POA: Diagnosis not present

## 2012-11-17 DIAGNOSIS — H2510 Age-related nuclear cataract, unspecified eye: Secondary | ICD-10-CM | POA: Diagnosis not present

## 2012-11-23 ENCOUNTER — Encounter: Payer: Self-pay | Admitting: Vascular Surgery

## 2012-11-24 ENCOUNTER — Encounter (INDEPENDENT_AMBULATORY_CARE_PROVIDER_SITE_OTHER): Payer: Medicare Other | Admitting: *Deleted

## 2012-11-24 ENCOUNTER — Other Ambulatory Visit: Payer: Self-pay

## 2012-11-24 ENCOUNTER — Ambulatory Visit: Payer: Medicare Other | Admitting: Vascular Surgery

## 2012-11-24 ENCOUNTER — Encounter: Payer: Self-pay | Admitting: Vascular Surgery

## 2012-11-24 ENCOUNTER — Ambulatory Visit (INDEPENDENT_AMBULATORY_CARE_PROVIDER_SITE_OTHER): Payer: Medicare Other | Admitting: Vascular Surgery

## 2012-11-24 VITALS — BP 169/77 | HR 59 | Resp 16 | Ht 66.0 in | Wt 159.0 lb

## 2012-11-24 DIAGNOSIS — I739 Peripheral vascular disease, unspecified: Secondary | ICD-10-CM

## 2012-11-24 DIAGNOSIS — Z48812 Encounter for surgical aftercare following surgery on the circulatory system: Secondary | ICD-10-CM

## 2012-11-24 DIAGNOSIS — I70219 Atherosclerosis of native arteries of extremities with intermittent claudication, unspecified extremity: Secondary | ICD-10-CM | POA: Insufficient documentation

## 2012-11-24 NOTE — Addendum Note (Signed)
Addended by: Adria Dill L on: 11/24/2012 11:00 AM   Modules accepted: Orders

## 2012-11-24 NOTE — Progress Notes (Signed)
[facility]  Subjective  - Right leg feels much better.  She walks daily with no pain.     Objective 169/77 59   16    PE: Palpable DP palpable Bilateral feet.  Palpable femoral pulses equal.   ABI'S Right 0.88 Left 0.89  Right duplex shows no high velocity flows t by pass anastomosis.    Assessment/Planning: S/P angioplasty right fem pop bypass Feb. 2014. S/P Fem-pop by pass Sept. 2013 F/U in 3 months for ABI'S and Right LE duplex.  [facility] 11/24/2012 9:58 AM --  Laboratory Lab Results: No results found for this basename: WBC, HGB, HCT, PLT,  in the last 72 hours BMET No results found for this basename: NA, K, CL, CO2, GLUCOSE, BUN, CREATININE, CALCIUM,  in the last 72 hours  COAG Lab Results  Component Value Date   INR 0.99 04/01/2012   No results found for this basename: PTT      Patient was found to have vein graft stenosis and distal in the right femoral popliteal graft placed in September 2013. This was a severe focal stenosis treated successfully with balloon angioplasty by Dr. [name]. Claudication symptoms are relieved and there is no elevation the distal end of the vein graft at this time.  Patient to return in 3 months with repeat duplex scan and ABI

## 2012-11-27 DIAGNOSIS — I10 Essential (primary) hypertension: Secondary | ICD-10-CM | POA: Diagnosis not present

## 2012-11-30 ENCOUNTER — Ambulatory Visit: Payer: Medicare Other | Admitting: Surgery

## 2012-12-11 ENCOUNTER — Encounter (INDEPENDENT_AMBULATORY_CARE_PROVIDER_SITE_OTHER): Payer: Medicare Other | Admitting: Ophthalmology

## 2012-12-11 DIAGNOSIS — H35379 Puckering of macula, unspecified eye: Secondary | ICD-10-CM | POA: Diagnosis not present

## 2012-12-11 DIAGNOSIS — H2510 Age-related nuclear cataract, unspecified eye: Secondary | ICD-10-CM

## 2012-12-11 DIAGNOSIS — H35469 Secondary vitreoretinal degeneration, unspecified eye: Secondary | ICD-10-CM | POA: Diagnosis not present

## 2012-12-11 DIAGNOSIS — H35039 Hypertensive retinopathy, unspecified eye: Secondary | ICD-10-CM

## 2012-12-11 DIAGNOSIS — I10 Essential (primary) hypertension: Secondary | ICD-10-CM | POA: Diagnosis not present

## 2012-12-11 DIAGNOSIS — H43819 Vitreous degeneration, unspecified eye: Secondary | ICD-10-CM

## 2013-01-03 DIAGNOSIS — H35379 Puckering of macula, unspecified eye: Secondary | ICD-10-CM

## 2013-01-03 HISTORY — DX: Puckering of macula, unspecified eye: H35.379

## 2013-01-12 DIAGNOSIS — R51 Headache: Secondary | ICD-10-CM | POA: Diagnosis not present

## 2013-01-15 ENCOUNTER — Encounter (INDEPENDENT_AMBULATORY_CARE_PROVIDER_SITE_OTHER): Payer: Medicare Other | Admitting: Ophthalmology

## 2013-01-15 DIAGNOSIS — H35039 Hypertensive retinopathy, unspecified eye: Secondary | ICD-10-CM

## 2013-01-15 DIAGNOSIS — H35379 Puckering of macula, unspecified eye: Secondary | ICD-10-CM | POA: Diagnosis not present

## 2013-01-15 DIAGNOSIS — H2510 Age-related nuclear cataract, unspecified eye: Secondary | ICD-10-CM

## 2013-01-15 DIAGNOSIS — H35371 Puckering of macula, right eye: Secondary | ICD-10-CM

## 2013-01-15 DIAGNOSIS — H43819 Vitreous degeneration, unspecified eye: Secondary | ICD-10-CM

## 2013-01-15 DIAGNOSIS — D3130 Benign neoplasm of unspecified choroid: Secondary | ICD-10-CM | POA: Diagnosis not present

## 2013-01-15 DIAGNOSIS — I10 Essential (primary) hypertension: Secondary | ICD-10-CM | POA: Diagnosis not present
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
## 2013-01-21 ENCOUNTER — Encounter (HOSPITAL_COMMUNITY): Payer: Self-pay | Admitting: Respiratory Therapy

## 2013-01-27 DIAGNOSIS — I10 Essential (primary) hypertension: Secondary | ICD-10-CM | POA: Diagnosis not present

## 2013-01-27 DIAGNOSIS — E7800 Pure hypercholesterolemia, unspecified: Secondary | ICD-10-CM | POA: Diagnosis not present

## 2013-01-27 DIAGNOSIS — I739 Peripheral vascular disease, unspecified: Secondary | ICD-10-CM | POA: Diagnosis not present

## 2013-02-01 ENCOUNTER — Encounter (HOSPITAL_COMMUNITY): Payer: Self-pay | Admitting: *Deleted

## 2013-02-01 MED ORDER — TROPICAMIDE 1 % OP SOLN
1.0000 [drp] | OPHTHALMIC | Status: AC | PRN
  Administered 2013-02-02 (×3): 1 [drp] via OPHTHALMIC
  Filled 2013-02-01: qty 3

## 2013-02-01 MED ORDER — CEFAZOLIN SODIUM-DEXTROSE 2-3 GM-% IV SOLR
2.0000 g | INTRAVENOUS | Status: AC
  Administered 2013-02-02: 2 g via INTRAVENOUS
  Filled 2013-02-01: qty 50

## 2013-02-01 MED ORDER — PHENYLEPHRINE HCL 2.5 % OP SOLN
1.0000 [drp] | OPHTHALMIC | Status: AC | PRN
  Administered 2013-02-02 (×2): 1 [drp] via OPHTHALMIC

## 2013-02-01 MED ORDER — GATIFLOXACIN 0.5 % OP SOLN
1.0000 [drp] | OPHTHALMIC | Status: AC | PRN
  Administered 2013-02-02 (×3): 1 [drp] via OPHTHALMIC
  Filled 2013-02-01: qty 2.5

## 2013-02-01 MED ORDER — CYCLOPENTOLATE HCL 1 % OP SOLN
1.0000 [drp] | OPHTHALMIC | Status: AC | PRN
  Administered 2013-02-02 (×3): 1 [drp] via OPHTHALMIC
  Filled 2013-02-01: qty 2

## 2013-02-02 ENCOUNTER — Encounter (HOSPITAL_COMMUNITY)
Admission: RE | Disposition: A | Discharge status: Home / Self Care | Payer: Self-pay | Source: Ambulatory Visit | Attending: Ophthalmology

## 2013-02-02 ENCOUNTER — Ambulatory Visit (HOSPITAL_COMMUNITY)
Admission: RE | Admit: 2013-02-02 | Discharge: 2013-02-03 | Disposition: A | Discharge status: Home / Self Care | Payer: Medicare Other | Source: Ambulatory Visit | Attending: Ophthalmology | Admitting: Ophthalmology

## 2013-02-02 ENCOUNTER — Encounter (HOSPITAL_COMMUNITY): Payer: Self-pay | Admitting: *Deleted

## 2013-02-02 ENCOUNTER — Ambulatory Visit (HOSPITAL_COMMUNITY): Payer: Medicare Other | Admitting: Anesthesiology

## 2013-02-02 ENCOUNTER — Encounter (HOSPITAL_COMMUNITY): Payer: Self-pay | Admitting: Anesthesiology

## 2013-02-02 DIAGNOSIS — I739 Peripheral vascular disease, unspecified: Secondary | ICD-10-CM | POA: Diagnosis not present

## 2013-02-02 DIAGNOSIS — I10 Essential (primary) hypertension: Secondary | ICD-10-CM | POA: Diagnosis not present

## 2013-02-02 DIAGNOSIS — H35379 Puckering of macula, unspecified eye: Secondary | ICD-10-CM | POA: Insufficient documentation

## 2013-02-02 DIAGNOSIS — H35371 Puckering of macula, right eye: Secondary | ICD-10-CM

## 2013-02-02 DIAGNOSIS — K219 Gastro-esophageal reflux disease without esophagitis: Secondary | ICD-10-CM | POA: Diagnosis not present

## 2013-02-02 HISTORY — DX: Puckering of macula, unspecified eye: H35.379

## 2013-02-02 HISTORY — PX: GAS/FLUID EXCHANGE: SHX5334

## 2013-02-02 HISTORY — PX: VITRECTOMY: SHX106

## 2013-02-02 HISTORY — PX: 25 GAUGE PARS PLANA VITRECTOMY WITH 20 GAUGE MVR PORT FOR MACULAR HOLE: SHX6096

## 2013-02-02 HISTORY — PX: LASER PHOTO ABLATION: SHX5942

## 2013-02-02 HISTORY — PX: MEMBRANE PEEL: SHX5967

## 2013-02-02 LAB — CBC
Hemoglobin: 12.6 g/dL (ref 12.0–15.0)
MCHC: 34 g/dL (ref 30.0–36.0)
RDW: 14.2 % (ref 11.5–15.5)
WBC: 7.4 10*3/uL (ref 4.0–10.5)

## 2013-02-02 LAB — BASIC METABOLIC PANEL
BUN: 17 mg/dL (ref 6–23)
GFR calc Af Amer: 71 mL/min — ABNORMAL LOW (ref >90)
GFR calc non Af Amer: 61 mL/min — ABNORMAL LOW (ref >90)
Potassium: 3.6 mEq/L (ref 3.5–5.1)

## 2013-02-02 SURGERY — 25 GAUGE PARS PLANA VITRECTOMY WITH 20 GAUGE MVR PORT FOR MACULAR HOLE
Anesthesia: General | Site: Eye | Laterality: Right | Wound class: Clean

## 2013-02-02 MED ORDER — BSS IO SOLN
INTRAOCULAR | Status: DC | PRN
  Administered 2013-02-02: 15 mL via INTRAOCULAR

## 2013-02-02 MED ORDER — BSS IO SOLN
INTRAOCULAR | Status: AC
  Filled 2013-02-02: qty 15

## 2013-02-02 MED ORDER — LIDOCAINE HCL (CARDIAC) 20 MG/ML IV SOLN
INTRAVENOUS | Status: DC | PRN
  Administered 2013-02-02: 80 mg via INTRAVENOUS

## 2013-02-02 MED ORDER — BUPIVACAINE HCL (PF) 0.75 % IJ SOLN
INTRAMUSCULAR | Status: DC | PRN
  Administered 2013-02-02: 10 mL

## 2013-02-02 MED ORDER — SODIUM CHLORIDE 0.45 % IV SOLN
INTRAVENOUS | Status: DC
  Administered 2013-02-02: 15:00:00 via INTRAVENOUS

## 2013-02-02 MED ORDER — MAGNESIUM HYDROXIDE 400 MG/5ML PO SUSP: 15.0000 mL | Freq: Four times a day (QID) | ORAL | Status: DC | PRN

## 2013-02-02 MED ORDER — SODIUM HYALURONATE 10 MG/ML IO SOLN
INTRAOCULAR | Status: AC
  Filled 2013-02-02: qty 0.85

## 2013-02-02 MED ORDER — ACETAZOLAMIDE SODIUM 500 MG IJ SOLR
500.0000 mg | Freq: Once | INTRAMUSCULAR | Status: AC
  Administered 2013-02-03: 500 mg via INTRAVENOUS
  Filled 2013-02-02: qty 500

## 2013-02-02 MED ORDER — ROCURONIUM BROMIDE 100 MG/10ML IV SOLN
INTRAVENOUS | Status: DC | PRN
  Administered 2013-02-02: 30 mg via INTRAVENOUS

## 2013-02-02 MED ORDER — EPINEPHRINE HCL 1 MG/ML IJ SOLN
INTRAOCULAR | Status: DC | PRN
  Administered 2013-02-02: 11:00:00

## 2013-02-02 MED ORDER — TEMAZEPAM 15 MG PO CAPS: 15.0000 mg | ORAL_CAPSULE | Freq: Every evening | ORAL | Status: DC | PRN

## 2013-02-02 MED ORDER — GENTAMICIN SULFATE 40 MG/ML IJ SOLN
INTRAMUSCULAR | Status: AC
  Filled 2013-02-02: qty 2

## 2013-02-02 MED ORDER — GLYCOPYRROLATE 0.2 MG/ML IJ SOLN
INTRAMUSCULAR | Status: DC | PRN
  Administered 2013-02-02 (×2): 0.2 mg via INTRAVENOUS

## 2013-02-02 MED ORDER — ZOLPIDEM TARTRATE 5 MG PO TABS: 5.0000 mg | ORAL_TABLET | Freq: Every evening | ORAL | Status: DC | PRN

## 2013-02-02 MED ORDER — EPINEPHRINE HCL 1 MG/ML IJ SOLN
INTRAMUSCULAR | Status: AC
  Filled 2013-02-02: qty 1

## 2013-02-02 MED ORDER — OXYCODONE HCL 5 MG PO TABS: 5.0000 mg | ORAL_TABLET | Freq: Once | ORAL | Status: DC | PRN

## 2013-02-02 MED ORDER — LISINOPRIL 10 MG PO TABS
10.0000 mg | ORAL_TABLET | Freq: Every day | ORAL | Status: DC
  Administered 2013-02-02: 10 mg via ORAL
  Filled 2013-02-02 (×2): qty 1

## 2013-02-02 MED ORDER — LATANOPROST 0.005 % OP SOLN
1.0000 [drp] | Freq: Every day | OPHTHALMIC | Status: DC
  Filled 2013-02-02: qty 2.5

## 2013-02-02 MED ORDER — TETRACAINE HCL 0.5 % OP SOLN
2.0000 [drp] | Freq: Once | OPHTHALMIC | Status: DC
  Filled 2013-02-02: qty 2

## 2013-02-02 MED ORDER — LIDOCAINE HCL 2 % IJ SOLN
INTRAMUSCULAR | Status: AC
  Filled 2013-02-02: qty 20

## 2013-02-02 MED ORDER — SIMVASTATIN 5 MG PO TABS
5.0000 mg | ORAL_TABLET | Freq: Every day | ORAL | Status: DC
  Administered 2013-02-02: 5 mg via ORAL
  Filled 2013-02-02 (×2): qty 1

## 2013-02-02 MED ORDER — HYPROMELLOSE (GONIOSCOPIC) 2.5 % OP SOLN
OPHTHALMIC | Status: AC
  Filled 2013-02-02: qty 15

## 2013-02-02 MED ORDER — MORPHINE SULFATE 2 MG/ML IJ SOLN: 1.0000 mg | INTRAMUSCULAR | Status: DC | PRN

## 2013-02-02 MED ORDER — HEMOSTATIC AGENTS (NO CHARGE) OPTIME
TOPICAL | Status: DC | PRN
  Administered 2013-02-02: 1 via TOPICAL

## 2013-02-02 MED ORDER — MINERAL OIL LIGHT 100 % EX OIL
TOPICAL_OIL | CUTANEOUS | Status: AC
  Filled 2013-02-02: qty 25

## 2013-02-02 MED ORDER — PREDNISOLONE ACETATE 1 % OP SUSP
1.0000 [drp] | Freq: Four times a day (QID) | OPHTHALMIC | Status: DC
  Filled 2013-02-02: qty 5
  Filled 2013-02-02: qty 1

## 2013-02-02 MED ORDER — HYDROCODONE-ACETAMINOPHEN 5-325 MG PO TABS: 1.0000 | ORAL_TABLET | ORAL | Status: DC | PRN

## 2013-02-02 MED ORDER — ACETAZOLAMIDE SODIUM 500 MG IJ SOLR
INTRAMUSCULAR | Status: AC
  Filled 2013-02-02: qty 500

## 2013-02-02 MED ORDER — BACITRACIN-POLYMYXIN B 500-10000 UNIT/GM OP OINT
1.0000 "application " | TOPICAL_OINTMENT | Freq: Four times a day (QID) | OPHTHALMIC | Status: DC
  Filled 2013-02-02: qty 3.5

## 2013-02-02 MED ORDER — TRIAMCINOLONE ACETONIDE 40 MG/ML IJ SUSP
INTRAMUSCULAR | Status: AC
  Filled 2013-02-02: qty 5

## 2013-02-02 MED ORDER — MIDAZOLAM HCL 5 MG/5ML IJ SOLN
INTRAMUSCULAR | Status: DC | PRN
  Administered 2013-02-02: 2 mg via INTRAVENOUS

## 2013-02-02 MED ORDER — ONDANSETRON HCL 4 MG/2ML IJ SOLN: 4.0000 mg | Freq: Four times a day (QID) | INTRAMUSCULAR | Status: DC | PRN

## 2013-02-02 MED ORDER — SODIUM CHLORIDE 0.9 % IJ SOLN
INTRAMUSCULAR | Status: DC | PRN
  Administered 2013-02-02: 11:00:00

## 2013-02-02 MED ORDER — ACETAMINOPHEN 325 MG PO TABS
325.0000 mg | ORAL_TABLET | ORAL | Status: DC | PRN
  Administered 2013-02-02: 650 mg via ORAL
  Filled 2013-02-02: qty 2

## 2013-02-02 MED ORDER — SODIUM CHLORIDE 0.9 % IV SOLN
INTRAVENOUS | Status: DC
  Administered 2013-02-02: 11:00:00 via INTRAVENOUS

## 2013-02-02 MED ORDER — DEXAMETHASONE SODIUM PHOSPHATE 10 MG/ML IJ SOLN
INTRAMUSCULAR | Status: AC
  Filled 2013-02-02: qty 1

## 2013-02-02 MED ORDER — BACITRACIN-POLYMYXIN B 500-10000 UNIT/GM OP OINT
TOPICAL_OINTMENT | OPHTHALMIC | Status: DC | PRN
  Administered 2013-02-02: 1 via OPHTHALMIC

## 2013-02-02 MED ORDER — ATROPINE SULFATE 1 % OP SOLN
OPHTHALMIC | Status: AC
  Filled 2013-02-02: qty 2

## 2013-02-02 MED ORDER — DOCUSATE SODIUM 100 MG PO CAPS
100.0000 mg | ORAL_CAPSULE | Freq: Two times a day (BID) | ORAL | Status: DC
  Administered 2013-02-02 – 2013-02-03 (×2): 100 mg via ORAL
  Filled 2013-02-02 (×2): qty 1

## 2013-02-02 MED ORDER — PROPOFOL 10 MG/ML IV BOLUS
INTRAVENOUS | Status: DC | PRN
  Administered 2013-02-02: 150 mg via INTRAVENOUS

## 2013-02-02 MED ORDER — BRIMONIDINE TARTRATE 0.2 % OP SOLN
1.0000 [drp] | Freq: Two times a day (BID) | OPHTHALMIC | Status: DC
  Filled 2013-02-02: qty 5

## 2013-02-02 MED ORDER — FENTANYL CITRATE 0.05 MG/ML IJ SOLN
INTRAMUSCULAR | Status: DC | PRN
  Administered 2013-02-02: 150 ug via INTRAVENOUS

## 2013-02-02 MED ORDER — SODIUM CHLORIDE 0.9 % IV SOLN
INTRAVENOUS | Status: DC | PRN
  Administered 2013-02-02 (×2): via INTRAVENOUS

## 2013-02-02 MED ORDER — PANTOPRAZOLE SODIUM 40 MG PO TBEC
40.0000 mg | DELAYED_RELEASE_TABLET | Freq: Every day | ORAL | Status: DC
  Administered 2013-02-02 – 2013-02-03 (×2): 40 mg via ORAL
  Filled 2013-02-02 (×2): qty 1

## 2013-02-02 MED ORDER — FENTANYL CITRATE 0.05 MG/ML IJ SOLN: 25.0000 ug | INTRAMUSCULAR | Status: DC | PRN

## 2013-02-02 MED ORDER — BACITRACIN-POLYMYXIN B 500-10000 UNIT/GM OP OINT
TOPICAL_OINTMENT | OPHTHALMIC | Status: AC
  Filled 2013-02-02: qty 3.5

## 2013-02-02 MED ORDER — SIMVASTATIN 5 MG PO TABS: 5.0000 mg | ORAL_TABLET | Freq: Every day | ORAL | Status: DC

## 2013-02-02 MED ORDER — LACTATED RINGERS IV SOLN: INTRAVENOUS | Status: DC

## 2013-02-02 MED ORDER — EPHEDRINE SULFATE 50 MG/ML IJ SOLN
INTRAMUSCULAR | Status: DC | PRN
  Administered 2013-02-02: 10 mg via INTRAVENOUS

## 2013-02-02 MED ORDER — SODIUM HYALURONATE 10 MG/ML IO SOLN
INTRAOCULAR | Status: DC | PRN
  Administered 2013-02-02: 0.85 mL via INTRAOCULAR

## 2013-02-02 MED ORDER — NEOSTIGMINE METHYLSULFATE 1 MG/ML IJ SOLN
INTRAMUSCULAR | Status: DC | PRN
  Administered 2013-02-02: 2 mg via INTRAVENOUS

## 2013-02-02 MED ORDER — BUPIVACAINE HCL (PF) 0.75 % IJ SOLN
INTRAMUSCULAR | Status: AC
  Filled 2013-02-02: qty 10

## 2013-02-02 MED ORDER — DEXAMETHASONE SODIUM PHOSPHATE 10 MG/ML IJ SOLN
INTRAMUSCULAR | Status: DC | PRN
  Administered 2013-02-02: 10 mg

## 2013-02-02 MED ORDER — POLYMYXIN B SULFATE 500000 UNITS IJ SOLR
INTRAMUSCULAR | Status: AC
  Filled 2013-02-02: qty 1

## 2013-02-02 MED ORDER — KETOROLAC TROMETHAMINE 0.4 % OP SOLN: 1.0000 [drp] | Freq: Four times a day (QID) | OPHTHALMIC | Status: DC

## 2013-02-02 MED ORDER — ONDANSETRON HCL 4 MG/2ML IJ SOLN
INTRAMUSCULAR | Status: DC | PRN
  Administered 2013-02-02: 4 mg via INTRAVENOUS

## 2013-02-02 MED ORDER — OXYCODONE HCL 5 MG/5ML PO SOLN: 5.0000 mg | Freq: Once | ORAL | Status: DC | PRN

## 2013-02-02 MED ORDER — GATIFLOXACIN 0.5 % OP SOLN
1.0000 [drp] | Freq: Four times a day (QID) | OPHTHALMIC | Status: DC
  Filled 2013-02-02: qty 2.5

## 2013-02-02 MED ORDER — PHENYLEPHRINE HCL 2.5 % OP SOLN
OPHTHALMIC | Status: AC
  Administered 2013-02-02: 1 [drp] via OPHTHALMIC
  Filled 2013-02-02: qty 2

## 2013-02-02 MED ORDER — KETOROLAC TROMETHAMINE 0.5 % OP SOLN
1.0000 [drp] | Freq: Four times a day (QID) | OPHTHALMIC | Status: DC
  Filled 2013-02-02: qty 3

## 2013-02-02 SURGICAL SUPPLY — 56 items
BALL CTTN LRG ABS STRL LF (GAUZE/BANDAGES/DRESSINGS) ×3
BLADE MVR KNIFE 20G (BLADE) ×2 IMPLANT
CANNULA SUBRETINAL FLUID 20G (BLADE) ×2 IMPLANT
CLOTH BEACON ORANGE TIMEOUT ST (SAFETY) ×2 IMPLANT
CORDS BIPOLAR (ELECTRODE) ×2 IMPLANT
COTTONBALL LRG STERILE PKG (GAUZE/BANDAGES/DRESSINGS) ×6 IMPLANT
COVER MAYO STAND STRL (DRAPES) ×1 IMPLANT
DRAPE INCISE 51X51 W/FILM STRL (DRAPES) ×1 IMPLANT
DRAPE OPHTHALMIC 77X100 STRL (CUSTOM PROCEDURE TRAY) ×2 IMPLANT
EAGLE VIT/RET MICRO PIC 168 25 (MISCELLANEOUS) ×1 IMPLANT
FILTER BLUE MILLIPORE (MISCELLANEOUS) ×2 IMPLANT
FILTER STRAW FLUID ASPIR (MISCELLANEOUS) ×3 IMPLANT
GAS AUTO FILL CONSTEL (OPHTHALMIC) ×2
GAS AUTO FILL CONSTELLATION (OPHTHALMIC) IMPLANT
GLOVE BIOGEL PI IND STRL 6.5 (GLOVE) IMPLANT
GLOVE BIOGEL PI INDICATOR 6.5 (GLOVE) ×1
GLOVE SS BIOGEL STRL SZ 6.5 (GLOVE) ×1 IMPLANT
GLOVE SS BIOGEL STRL SZ 7 (GLOVE) ×1 IMPLANT
GLOVE SUPERSENSE BIOGEL SZ 6.5 (GLOVE) ×1
GLOVE SUPERSENSE BIOGEL SZ 7 (GLOVE) ×1
GLOVE SURG 8.5 LATEX PF (GLOVE) ×2 IMPLANT
GLOVE SURG SS PI 6.5 STRL IVOR (GLOVE) ×2 IMPLANT
GOWN STRL NON-REIN LRG LVL3 (GOWN DISPOSABLE) ×8 IMPLANT
HANDLE PNEUMATIC FOR CONSTEL (OPHTHALMIC) ×2 IMPLANT
KIT BASIN OR (CUSTOM PROCEDURE TRAY) ×2 IMPLANT
MASK EYE SHIELD (GAUZE/BANDAGES/DRESSINGS) ×1 IMPLANT
NDL 18GX1X1/2 (RX/OR ONLY) (NEEDLE) ×1 IMPLANT
NDL 25GX 5/8IN NON SAFETY (NEEDLE) ×1 IMPLANT
NDL HYPO 30X.5 LL (NEEDLE) ×3 IMPLANT
NEEDLE 18GX1X1/2 (RX/OR ONLY) (NEEDLE) ×2 IMPLANT
NEEDLE 25GX 5/8IN NON SAFETY (NEEDLE) ×2 IMPLANT
NEEDLE BACKFLUSH 1281 A1 (NEEDLE) ×1 IMPLANT
NEEDLE HYPO 30X.5 LL (NEEDLE) ×6 IMPLANT
NS IRRIG 1000ML POUR BTL (IV SOLUTION) ×2 IMPLANT
PACK VITRECTOMY CUSTOM (CUSTOM PROCEDURE TRAY) ×2 IMPLANT
PAD ARMBOARD 7.5X6 YLW CONV (MISCELLANEOUS) ×4 IMPLANT
PAD EYE OVAL STERILE LF (GAUZE/BANDAGES/DRESSINGS) ×1 IMPLANT
PAK VITRECTOMY PIK 25 GA (OPHTHALMIC RELATED) IMPLANT
PROBE DIRECTIONAL LASER (MISCELLANEOUS) IMPLANT
REPL STRA BRUSH NDL (NEEDLE) ×1 IMPLANT
REPL STRA BRUSH NEEDLE (NEEDLE) ×2 IMPLANT
RESERVOIR BACK FLUSH (MISCELLANEOUS) ×2 IMPLANT
ROLLS DENTAL (MISCELLANEOUS) ×4 IMPLANT
SCRAPER DIAMOND DUST MEMBRANE (MISCELLANEOUS) ×2 IMPLANT
SPONGE SURGIFOAM ABS GEL 12-7 (HEMOSTASIS) ×2 IMPLANT
SUT ETHILON 9 0 TG140 8 (SUTURE) ×2 IMPLANT
SYR 20CC LL (SYRINGE) ×2 IMPLANT
SYR 50ML LL SCALE MARK (SYRINGE) ×2 IMPLANT
SYR BULB 3OZ (MISCELLANEOUS) ×2 IMPLANT
SYR TB 1ML LUER SLIP (SYRINGE) ×2 IMPLANT
TAPE SURG TRANSPORE 1 IN (GAUZE/BANDAGES/DRESSINGS) IMPLANT
TAPE SURGICAL TRANSPORE 1 IN (GAUZE/BANDAGES/DRESSINGS) ×1
TOWEL OR 17X24 6PK STRL BLUE (TOWEL DISPOSABLE) ×4 IMPLANT
TOWEL OR 17X26 10 PK STRL BLUE (TOWEL DISPOSABLE) ×1 IMPLANT
WATER STERILE IRR 1000ML POUR (IV SOLUTION) ×2 IMPLANT
WIPE INSTRUMENT VISIWIPE 73X73 (MISCELLANEOUS) ×2 IMPLANT

## 2013-02-02 NOTE — Anesthesia Postprocedure Evaluation (Signed)
Anesthesia Post Note  Patient: Donna Garza  Procedure(s) Performed: Procedure(s) (LRB): 25 GAUGE PARS PLANA VITRECTOMY WITH 20 GAUGE MVR PORT FOR MACULAR HOLE (Right) MEMBRANE PEEL (Right) GAS/FLUID EXCHANGE (Right) LASER PHOTO ABLATION (Right)  Anesthesia type: General  Patient location: PACU  Post pain: Pain level controlled and Adequate analgesia  Post assessment: Post-op Vital signs reviewed, Patient's Cardiovascular Status Stable, Respiratory Function Stable, Patent Airway and Pain level controlled  Last Vitals:  Filed Vitals:   02/02/13 1348  BP:   Pulse:   Temp: 36.2 C  Resp:     Post vital signs: Reviewed and stable  Level of consciousness: awake, alert  and oriented  Complications: No apparent anesthesia complications

## 2013-02-02 NOTE — Preoperative (Signed)
Beta Blockers   Reason not to administer Beta Blockers:Not Applicable 

## 2013-02-02 NOTE — Anesthesia Preprocedure Evaluation (Addendum)
Anesthesia Evaluation  Patient identified by MRN, date of birth, ID band Patient awake    Reviewed: Allergy & Precautions, H&P , NPO status , Patient's Chart, lab work & pertinent test results  History of Anesthesia Complications Negative for: history of anesthetic complications  Airway Mallampati: II TM Distance: >3 FB Neck ROM: full    Dental  (+) Teeth Intact, Dental Advisory Given and Caps,    Pulmonary neg pulmonary ROS,          Cardiovascular hypertension, Pt. on medications + Peripheral Vascular Disease     Neuro/Psych negative neurological ROS  negative psych ROS   GI/Hepatic Neg liver ROS, GERD-  Medicated and Controlled,  Endo/Other  negative endocrine ROS  Renal/GU negative Renal ROS     Musculoskeletal negative musculoskeletal ROS (+)   Abdominal   Peds  Hematology negative hematology ROS (+)   Anesthesia Other Findings   Reproductive/Obstetrics negative OB ROS                         Anesthesia Physical Anesthesia Plan  ASA: II  Anesthesia Plan: General   Post-op Pain Management:    Induction: Intravenous  Airway Management Planned: Oral ETT  Additional Equipment:   Intra-op Plan:   Post-operative Plan: Extubation in OR  Informed Consent: I have reviewed the patients History and Physical, chart, labs and discussed the procedure including the risks, benefits and alternatives for the proposed anesthesia with the patient or authorized representative who has indicated his/her understanding and acceptance.     Plan Discussed with: CRNA, Anesthesiologist and Surgeon  Anesthesia Plan Comments:         Anesthesia Quick Evaluation

## 2013-02-02 NOTE — Brief Op Note (Signed)
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  Assistant:  Rosalie Doctor SA    Anesthesia: General  Specimen: none  Estimated blood loss:  1cc  Complications: none  Patient sent to PACU in good condition  Composed by Sherrie George MD  Dictation number: [id number]

## 2013-02-02 NOTE — H&P (Signed)
I examined the patient today and there is no change in the medical status 

## 2013-02-03 MED ORDER — GATIFLOXACIN 0.5 % OP SOLN: 1.0000 [drp] | Freq: Four times a day (QID) | OPHTHALMIC | Status: DC

## 2013-02-03 MED ORDER — BACITRACIN-POLYMYXIN B 500-10000 UNIT/GM OP OINT: 1.0000 "application " | TOPICAL_OINTMENT | Freq: Four times a day (QID) | OPHTHALMIC | Status: DC

## 2013-02-03 MED ORDER — PREDNISOLONE ACETATE 1 % OP SUSP: 1.0000 [drp] | Freq: Four times a day (QID) | OPHTHALMIC | Status: DC

## 2013-02-03 NOTE — Transfer of Care (Signed)
Immediate Anesthesia Transfer of Care Note  Patient: Donna Garza  Procedure(s) Performed: Procedure(s): 25 GAUGE PARS PLANA VITRECTOMY WITH 20 GAUGE MVR PORT FOR MACULAR HOLE (Right) MEMBRANE PEEL (Right) GAS/FLUID EXCHANGE (Right) LASER PHOTO ABLATION (Right)  Patient Location: PACU  Anesthesia Type:General  Level of Consciousness: awake, alert  and oriented  Airway & Oxygen Therapy: Patient Spontanous Breathing and Patient connected to nasal cannula oxygen  Post-op Assessment: Report given to PACU RN and Post -op Vital signs reviewed and stable  Post vital signs: Reviewed and stable  Complications: No apparent anesthesia complications

## 2013-02-03 NOTE — Progress Notes (Signed)
02/03/2013, 6:36 AM  Mental Status:  Awake, Alert, Oriented  Anterior segment: Cornea  Clear    Anterior Chamber Clear    Lens:    IOL,  Intra Ocular Pressure 18 mmHg with Tonopen  Vitreous: Clear 90%gas bubble   Retina:  Attached Good laser reaction  Impression: Excellent result Retina attached  Final Diagnosis: Principal Problem:   Macular pucker, right eye   Plan: start post operative eye drops.  Discharge to home.  Give post operative instructions  Sherrie George 02/03/2013, 6:36 AM

## 2013-02-03 NOTE — Progress Notes (Signed)
Pt discharged to home

## 2013-02-03 NOTE — Discharge Summary (Signed)
Discharge summary not needed on [facility] patients per medical records. 

## 2013-02-03 NOTE — Op Note (Signed)
NAMEJALEY, Donna Garza               ACCOUNT NO.:  [id number]  MEDICAL RECORD NO.:  [id number]  LOCATION:  [id number]                        FACILITY:  [facility]  PHYSICIAN:  Beulah Gandy. Ashley Royalty, M.D. DATE OF BIRTH:  07-Jan-1941  DATE OF PROCEDURE:  02/02/2013 DATE OF DISCHARGE:                              OPERATIVE REPORT   ADMISSION DIAGNOSIS:  Preretinal fibrosis, right eye.  PROCEDURES:  Pars plana vitrectomy, retinal photocoagulation, gas fluid exchange, membrane peel, all in the right eye.  SURGEON:  Beulah Gandy. Ashley Royalty, M.D.  ASSISTANT:  Rosalie Doctor, SA.  ANESTHESIA:  General.  DETAILS:  Usual prep and drape, the indirect ophthalmoscope laser was moved into place, 856 burns were placed around the retinal periphery. The power of 460 mW 1000 microns each and 0.1 seconds each.  These were placed around the areas of a weak retina and retinal thinning.  The attention was then carried to the pars plana area where a 20-gauge 3- layered MVR opening was made in the conjunctiva and sclera at 2 o'clock. The 25-gauge trocars at 8 and 10 o'clock.  Contact lens ring anchored into place at 6 and 12 o'clock.  The Provisc was placed on the corneal surface and a flat contact lens was placed.  The pars plana vitrectomy was begun just behind the pseudophakos vitrectomy was carried posteriorly in a core fashion down to the macular surface.  A white fibrous membrane was apparent on the retinal surface.  The vitrectomy was carried into the mid periphery and surface proliferation was removed.  The vitrectomy was carried into the far periphery with a 30- degree prismatic wide-angle contact lens.  This was rotated 360 degrees for proper viewing.  The magnifying contact lens was then positioned back on the eye on the cornea.  Under high magnification, the membrane was peeled with the diamond-dusted membrane scraper,  the lighted pick, and the automated forceps.  Once the membrane was peeled across the macular  region, there was yellow pigment clumped in these in the center, but no hole was seen in the macular region.  A 100% gas fluid exchange was carried out with room air.  The instruments were removed from the eye.  The 20-gauge opening was closed with 9-0 nylon.  The conjunctiva was closed with wet-field cautery.  Polymyxin and gentamicin were irrigated into tenon space atropine.  No atropine was used.  Marcaine was injected around the globe for postop pain.  The closing pressure was 10 with a Barraquer tonometer.  COMPLICATIONS:  None.  DURATION:  1 hour.  The patient was awakened, taken to recovery in satisfactory condition.     Beulah Gandy. Ashley Royalty, M.D.     JDM/MEDQ  D:  02/02/2013  T:  02/03/2013  Job:  [id number]

## 2013-02-08 ENCOUNTER — Encounter (HOSPITAL_COMMUNITY): Payer: Self-pay | Admitting: Ophthalmology

## 2013-02-09 ENCOUNTER — Inpatient Hospital Stay (INDEPENDENT_AMBULATORY_CARE_PROVIDER_SITE_OTHER): Payer: Medicare Other | Admitting: Ophthalmology

## 2013-02-09 DIAGNOSIS — H35379 Puckering of macula, unspecified eye: Secondary | ICD-10-CM

## 2013-03-01 ENCOUNTER — Encounter: Payer: Self-pay | Admitting: Vascular Surgery

## 2013-03-02 ENCOUNTER — Encounter (INDEPENDENT_AMBULATORY_CARE_PROVIDER_SITE_OTHER): Payer: Medicare Other | Admitting: *Deleted

## 2013-03-02 ENCOUNTER — Encounter: Payer: Self-pay | Admitting: Vascular Surgery

## 2013-03-02 ENCOUNTER — Ambulatory Visit (INDEPENDENT_AMBULATORY_CARE_PROVIDER_SITE_OTHER): Payer: Medicare Other | Admitting: Vascular Surgery

## 2013-03-02 VITALS — BP 160/84 | HR 57 | Resp 18 | Ht 66.0 in | Wt 160.0 lb

## 2013-03-02 DIAGNOSIS — I739 Peripheral vascular disease, unspecified: Secondary | ICD-10-CM

## 2013-03-02 DIAGNOSIS — Z48812 Encounter for surgical aftercare following surgery on the circulatory system: Secondary | ICD-10-CM | POA: Diagnosis not present

## 2013-03-02 DIAGNOSIS — I70219 Atherosclerosis of native arteries of extremities with intermittent claudication, unspecified extremity: Secondary | ICD-10-CM

## 2013-03-02 NOTE — Progress Notes (Signed)
Subjective:     Patient ID: Donna Garza, female   DOB: 1941/06/25, 72 y.o.   MRN: [id number]  HPI this 72 year old female had a right femoral-popliteal bypass graft performed in September of 2013 with saphenous vein. She was found to have a stenosis in the lower end of the vein graft in January of 2014 had balloon angioplasty of the stenosis performed by Dr. [name]. She had an excellent result with ABI of 0.86 on the right. She denies any claudication symptoms at this point stating she's able to ambulate further than 1 mile without difficulty.  Past Medical History  Diagnosis Date  . HTN (hypertension)   . GERD (gastroesophageal reflux disease)   . Hx of adenomatous colonic polyps     Last colonoscopy 2007. Benign polyp at that time. Tubular adenoma found at colonoscopy in 2004.  [name] Peripheral vascular disease   . 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    History  Substance Use Topics  . Smoking status: Never Smoker   . Smokeless tobacco: Never Used  . Alcohol Use: No    Family History  Problem Relation Age of Onset  . Colon cancer Neg Hx   . Breast cancer Mother   . Breast cancer Daughter   . Heart attack Father     Allergies  Allergen Reactions  . Diphenhydramine Other (See Comments)    "knocks me Completely out"    Current outpatient prescriptions:aspirin 81 MG tablet, Take 81 mg by mouth daily., Disp: , Rfl: ;  lovastatin (MEVACOR) 40 MG tablet, Take 40 mg by mouth at bedtime., Disp: , Rfl: ;  omeprazole (PRILOSEC) 20 MG capsule, Take 20 mg by mouth daily. , Disp: , Rfl: ;  pravastatin (PRAVACHOL) 20 MG tablet, Take 20 mg by mouth at bedtime. , Disp: , Rfl:  bacitracin-polymyxin b (POLYSPORIN) ophthalmic ointment, Place 1 application into the right eye 4 (four) times daily. apply to eye every 12 hours while awake, Disp: 3.5 g, Rfl: 0;  gatifloxacin (ZYMAXID) 0.5 % SOLN, Place 1 drop into the right eye 4 (four) times daily., Disp: , Rfl: ;  ketorolac (ACULAR) 0.4 % SOLN, Place 1 drop into  both eyes 4 (four) times daily., Disp: , Rfl:  lisinopril (PRINIVIL,ZESTRIL) 20 MG tablet, Take 10 mg by mouth at bedtime. , Disp: , Rfl: ;  prednisoLONE acetate (PRED FORTE) 1 % ophthalmic suspension, Place 1 drop into the right eye 4 (four) times daily., Disp: 5 mL, Rfl: 0  BP 160/84  Pulse 57  Resp 18  Ht 5\' 6"  (1.676 m)  Wt 160 lb (72.576 kg)  BMI 25.84 kg/m2  Body mass index is 25.84 kg/(m^2).          Review of Systems denies chest pain, dyspnea on exertion, PND, orthopnea, hemoptysis, productive cough.    Objective:   Physical Exam blood pressure 160 Rady 4 heart rate 87 respirations 18 General well-developed well-nourished female in no apparent stress alert and oriented x3 Lungs no rhonchi or wheezing Right lower extremity 3+ femoral 2+ popliteal and 1-2+ dorsalis pedis pulse palpable. Left leg with 3+ femoral 2+ popliteal 2+ dorsalis pedis pulse palpable.  Today I ordered a duplex scan of the right femoral-popliteal graft and ABIs which are reviewed and interpreted. Is no evidence of stenosis in the vein graft in the right leg at the present time and there is triphasic flow distally with ABI of 0.86 on the right and 0.72 on the left    Assessment:     Nicely  functioning right femoral-popliteal saphenous vein graft with no evidence of restenosis at area of balloon angioplasty and distal portion of vein graft    Plan:     Return in 3 months for repeat ABIs and duplex scan of right femoral-popliteal vein graft to look for evidence of restenosis and distal vein graft

## 2013-03-09 ENCOUNTER — Other Ambulatory Visit: Payer: Self-pay | Admitting: *Deleted

## 2013-03-09 ENCOUNTER — Encounter (INDEPENDENT_AMBULATORY_CARE_PROVIDER_SITE_OTHER): Payer: Medicare Other | Admitting: Ophthalmology

## 2013-03-09 DIAGNOSIS — Z48812 Encounter for surgical aftercare following surgery on the circulatory system: Secondary | ICD-10-CM

## 2013-03-09 DIAGNOSIS — I739 Peripheral vascular disease, unspecified: Secondary | ICD-10-CM

## 2013-03-09 DIAGNOSIS — H35379 Puckering of macula, unspecified eye: Secondary | ICD-10-CM

## 2013-03-10 ENCOUNTER — Other Ambulatory Visit: Payer: Self-pay

## 2013-04-26 DIAGNOSIS — L821 Other seborrheic keratosis: Secondary | ICD-10-CM | POA: Diagnosis not present

## 2013-04-26 DIAGNOSIS — L570 Actinic keratosis: Secondary | ICD-10-CM | POA: Diagnosis not present

## 2013-04-26 DIAGNOSIS — D1800 Hemangioma unspecified site: Secondary | ICD-10-CM | POA: Diagnosis not present

## 2013-05-18 ENCOUNTER — Encounter (INDEPENDENT_AMBULATORY_CARE_PROVIDER_SITE_OTHER): Payer: Medicare Other | Admitting: Ophthalmology

## 2013-05-18 DIAGNOSIS — H35039 Hypertensive retinopathy, unspecified eye: Secondary | ICD-10-CM | POA: Diagnosis not present

## 2013-05-18 DIAGNOSIS — I10 Essential (primary) hypertension: Secondary | ICD-10-CM | POA: Diagnosis not present

## 2013-05-18 DIAGNOSIS — H43819 Vitreous degeneration, unspecified eye: Secondary | ICD-10-CM

## 2013-05-18 DIAGNOSIS — D3130 Benign neoplasm of unspecified choroid: Secondary | ICD-10-CM

## 2013-05-18 DIAGNOSIS — H2510 Age-related nuclear cataract, unspecified eye: Secondary | ICD-10-CM

## 2013-05-28 DIAGNOSIS — E7800 Pure hypercholesterolemia, unspecified: Secondary | ICD-10-CM | POA: Diagnosis not present

## 2013-05-28 DIAGNOSIS — I10 Essential (primary) hypertension: Secondary | ICD-10-CM | POA: Diagnosis not present

## 2013-06-08 DIAGNOSIS — H40029 Open angle with borderline findings, high risk, unspecified eye: Secondary | ICD-10-CM | POA: Diagnosis not present

## 2013-06-10 ENCOUNTER — Other Ambulatory Visit: Payer: Self-pay

## 2013-06-14 ENCOUNTER — Encounter: Payer: Self-pay | Admitting: Vascular Surgery

## 2013-06-15 ENCOUNTER — Ambulatory Visit: Payer: Medicare Other | Admitting: Vascular Surgery

## 2013-06-15 ENCOUNTER — Encounter: Payer: Self-pay | Admitting: Vascular Surgery

## 2013-06-15 ENCOUNTER — Ambulatory Visit (INDEPENDENT_AMBULATORY_CARE_PROVIDER_SITE_OTHER)
Admission: RE | Admit: 2013-06-15 | Discharge: 2013-06-15 | Disposition: A | Discharge status: Home / Self Care | Payer: Medicare Other | Source: Ambulatory Visit | Attending: Vascular Surgery | Admitting: Vascular Surgery

## 2013-06-15 ENCOUNTER — Ambulatory Visit (HOSPITAL_COMMUNITY)
Admission: RE | Admit: 2013-06-15 | Discharge: 2013-06-15 | Disposition: A | Discharge status: Home / Self Care | Payer: Medicare Other | Source: Ambulatory Visit | Attending: Vascular Surgery | Admitting: Vascular Surgery

## 2013-06-15 ENCOUNTER — Ambulatory Visit (INDEPENDENT_AMBULATORY_CARE_PROVIDER_SITE_OTHER): Payer: Medicare Other | Admitting: Vascular Surgery

## 2013-06-15 VITALS — BP 140/75 | HR 75 | Ht 66.0 in | Wt 164.5 lb

## 2013-06-15 DIAGNOSIS — I70219 Atherosclerosis of native arteries of extremities with intermittent claudication, unspecified extremity: Secondary | ICD-10-CM | POA: Diagnosis not present

## 2013-06-15 DIAGNOSIS — Z48812 Encounter for surgical aftercare following surgery on the circulatory system: Secondary | ICD-10-CM

## 2013-06-15 DIAGNOSIS — I739 Peripheral vascular disease, unspecified: Secondary | ICD-10-CM | POA: Insufficient documentation

## 2013-06-15 NOTE — Progress Notes (Signed)
Subjective:     Patient ID: Donna Garza, female   DOB: [birth year], 72 y.o.   MRN: [id number]  HPI this 72 year old female returns for continued followup regarding her right femoral popliteal bypass graft performed in 2013 with saphenous vein to the below knee popliteal artery. She developed a vein graft stenosis proximal to the distal anastomosis which was treated with balloon angioplasty by Dr. [name] in January 20 14th. Since then she has had no claudication symptoms. She is able to ambulate on the treadmill for 30 minutes. She's had no rest pain or nonhealing ulcers.  Past Medical History  Diagnosis Date  . HTN (hypertension)   . GERD (gastroesophageal reflux disease)   . Hx of adenomatous colonic polyps     Last colonoscopy 2007. Benign polyp at that time. Tubular adenoma found at colonoscopy in 2004.  [name] Peripheral vascular disease   . 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    History  Substance Use Topics  . Smoking status: Never Smoker   . Smokeless tobacco: Never Used  . Alcohol Use: No    Family History  Problem Relation Age of Onset  . Colon cancer Neg Hx   . Breast cancer Mother   . Breast cancer Daughter   . Heart attack Father     Allergies  Allergen Reactions  . Diphenhydramine Other (See Comments)    "knocks me Completely out"    Current outpatient prescriptions:aspirin 81 MG tablet, Take 81 mg by mouth daily., Disp: , Rfl: ;  ketorolac (ACULAR) 0.4 % SOLN, Place 1 drop into both eyes 3 (three) times daily. , Disp: , Rfl: ;  lisinopril (PRINIVIL,ZESTRIL) 20 MG tablet, Take 10 mg by mouth at bedtime. , Disp: , Rfl: ;  lovastatin (MEVACOR) 40 MG tablet, Take 40 mg by mouth at bedtime., Disp: , Rfl:  omeprazole (PRILOSEC) 20 MG capsule, Take 20 mg by mouth daily. , Disp: , Rfl: ;  bacitracin-polymyxin b (POLYSPORIN) ophthalmic ointment, Place 1 application into the right eye 4 (four) times daily. apply to eye every 12 hours while awake, Disp: 3.5 g, Rfl: 0;  gatifloxacin (ZYMAXID)  0.5 % SOLN, Place 1 drop into the right eye 4 (four) times daily., Disp: , Rfl:  pravastatin (PRAVACHOL) 20 MG tablet, Take 20 mg by mouth at bedtime. , Disp: , Rfl: ;  prednisoLONE acetate (PRED FORTE) 1 % ophthalmic suspension, Place 1 drop into the right eye 4 (four) times daily., Disp: 5 mL, Rfl: 0  BP 140/75  Pulse 75  Ht 5\' 6"  (1.676 m)  Wt 164 lb 8 oz (74.617 kg)  BMI 26.56 kg/m2  SpO2 100%  Body mass index is 26.56 kg/(m^2).           Review of Systems denies chest pain, dyspnea on exertion, PND, orthopnea, hemoptysis, claudication. Other systems negative and complete review of systems     Objective:   Physical Exam BP 140/75  Pulse 75  Ht 5\' 6"  (1.676 m)  Wt 164 lb 8 oz (74.617 kg)  BMI 26.56 kg/m2  SpO2 100%  Gen.-alert and oriented x3 in no apparent distress HEENT normal for age Lungs no rhonchi or wheezing Cardiovascular regular rhythm no murmurs carotid pulses 3+ palpable no bruits audible Abdomen soft nontender no palpable masses Musculoskeletal free of  major deformities Skin clear -no rashes Neurologic normal Lower extremities 3+ femoral and 2+ dorsalis pedis pulses palpable bilaterally with no edema  Data ordered lower extremity ABIs and duplex scan of the right femoral popliteal bypass.  There is no evidence of any hemodynamically significant stenosis in the bypass and ABI is now 0.9 and on the right and 0.96 on the left        Assessment:      nicely functioning right femoral popliteal vein graft-status post balloon angioplasty of vein graft stenosis and distal aspect of vein graft in January of 2014     Plan:     Return in 6 months with duplex scan right femoral popliteal vein graft and check ABIs and see nurse practitioner unless she develops recurrent claudication in right leg and antrum Continue daily aspirin

## 2013-06-16 NOTE — Addendum Note (Signed)
Addended by: Sharee Pimple on: 06/16/2013 08:25 AM   Modules accepted: Orders

## 2013-07-06 DIAGNOSIS — I10 Essential (primary) hypertension: Secondary | ICD-10-CM | POA: Diagnosis not present

## 2013-07-06 DIAGNOSIS — I739 Peripheral vascular disease, unspecified: Secondary | ICD-10-CM | POA: Diagnosis not present

## 2013-07-06 DIAGNOSIS — E7800 Pure hypercholesterolemia, unspecified: Secondary | ICD-10-CM | POA: Diagnosis not present

## 2013-08-05 DEATH — deceased

## 2013-09-06 DIAGNOSIS — B359 Dermatophytosis, unspecified: Secondary | ICD-10-CM | POA: Diagnosis not present

## 2013-09-06 DIAGNOSIS — B354 Tinea corporis: Secondary | ICD-10-CM | POA: Diagnosis not present

## 2013-09-06 DIAGNOSIS — L0100 Impetigo, unspecified: Secondary | ICD-10-CM | POA: Diagnosis not present

## 2013-09-10 DIAGNOSIS — H40029 Open angle with borderline findings, high risk, unspecified eye: Secondary | ICD-10-CM | POA: Diagnosis not present

## 2013-09-27 DIAGNOSIS — B354 Tinea corporis: Secondary | ICD-10-CM | POA: Diagnosis not present

## 2013-11-10 DIAGNOSIS — I10 Essential (primary) hypertension: Secondary | ICD-10-CM | POA: Diagnosis not present

## 2013-11-10 DIAGNOSIS — E7800 Pure hypercholesterolemia, unspecified: Secondary | ICD-10-CM | POA: Diagnosis not present

## 2013-11-16 ENCOUNTER — Ambulatory Visit (INDEPENDENT_AMBULATORY_CARE_PROVIDER_SITE_OTHER): Payer: Medicare Other | Admitting: Ophthalmology

## 2013-11-16 DIAGNOSIS — H2510 Age-related nuclear cataract, unspecified eye: Secondary | ICD-10-CM

## 2013-11-16 DIAGNOSIS — H43819 Vitreous degeneration, unspecified eye: Secondary | ICD-10-CM | POA: Diagnosis not present

## 2013-11-16 DIAGNOSIS — I10 Essential (primary) hypertension: Secondary | ICD-10-CM | POA: Diagnosis not present

## 2013-11-16 DIAGNOSIS — H35039 Hypertensive retinopathy, unspecified eye: Secondary | ICD-10-CM | POA: Diagnosis not present

## 2013-11-16 DIAGNOSIS — D3130 Benign neoplasm of unspecified choroid: Secondary | ICD-10-CM

## 2013-11-17 DIAGNOSIS — I739 Peripheral vascular disease, unspecified: Secondary | ICD-10-CM | POA: Diagnosis not present

## 2013-11-17 DIAGNOSIS — I10 Essential (primary) hypertension: Secondary | ICD-10-CM | POA: Diagnosis not present

## 2013-11-17 DIAGNOSIS — E7800 Pure hypercholesterolemia, unspecified: Secondary | ICD-10-CM | POA: Diagnosis not present

## 2013-11-17 DIAGNOSIS — K2100 Gastro-esophageal reflux disease with esophagitis, without bleeding: Secondary | ICD-10-CM | POA: Diagnosis not present

## 2013-12-20 ENCOUNTER — Encounter: Payer: Self-pay | Admitting: Family

## 2013-12-21 ENCOUNTER — Other Ambulatory Visit (HOSPITAL_COMMUNITY): Payer: Medicare Other

## 2013-12-21 ENCOUNTER — Encounter (HOSPITAL_COMMUNITY): Payer: Medicare Other

## 2013-12-21 ENCOUNTER — Ambulatory Visit: Payer: Medicare Other | Admitting: Family

## 2013-12-29 ENCOUNTER — Encounter: Payer: Self-pay | Admitting: Family

## 2013-12-30 ENCOUNTER — Ambulatory Visit (INDEPENDENT_AMBULATORY_CARE_PROVIDER_SITE_OTHER): Payer: Medicare Other | Admitting: Family

## 2013-12-30 ENCOUNTER — Ambulatory Visit (HOSPITAL_COMMUNITY)
Admission: RE | Admit: 2013-12-30 | Discharge: 2013-12-30 | Disposition: A | Discharge status: Home / Self Care | Payer: Medicare Other | Source: Ambulatory Visit | Attending: Vascular Surgery | Admitting: Vascular Surgery

## 2013-12-30 ENCOUNTER — Ambulatory Visit (INDEPENDENT_AMBULATORY_CARE_PROVIDER_SITE_OTHER)
Admission: RE | Admit: 2013-12-30 | Discharge: 2013-12-30 | Disposition: A | Discharge status: Home / Self Care | Payer: Medicare Other | Source: Ambulatory Visit | Attending: Vascular Surgery | Admitting: Vascular Surgery

## 2013-12-30 ENCOUNTER — Encounter: Payer: Self-pay | Admitting: Family

## 2013-12-30 VITALS — BP 131/76 | HR 68 | Resp 16 | Ht 66.0 in | Wt 164.0 lb

## 2013-12-30 DIAGNOSIS — I70219 Atherosclerosis of native arteries of extremities with intermittent claudication, unspecified extremity: Secondary | ICD-10-CM | POA: Diagnosis not present

## 2013-12-30 DIAGNOSIS — Z48812 Encounter for surgical aftercare following surgery on the circulatory system: Secondary | ICD-10-CM | POA: Diagnosis not present

## 2013-12-30 DIAGNOSIS — I739 Peripheral vascular disease, unspecified: Secondary | ICD-10-CM
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
## 2013-12-30 NOTE — Patient Instructions (Addendum)
Peripheral Vascular Disease Peripheral Vascular Disease (PVD), also called Peripheral Arterial Disease (PAD), is a circulation problem caused by cholesterol (atherosclerotic plaque) deposits in the arteries. PVD commonly occurs in the lower extremities (legs) but it can occur in other areas of the body, such as your arms. The cholesterol buildup in the arteries reduces blood flow which can cause pain and other serious problems. The presence of PVD can place a person at risk for Coronary Artery Disease (CAD).  CAUSES  Causes of PVD can be many. It is usually associated with more than one risk factor such as:   High Cholesterol.  Smoking.  Diabetes.  Lack of exercise or inactivity.  High blood pressure (hypertension).  Obesity.  Family history. SYMPTOMS   When the lower extremities are affected, patients with PVD may experience:  Leg pain with exertion or physical activity. This is called INTERMITTENT CLAUDICATION. This may present as cramping or numbness with physical activity. The location of the pain is associated with the level of blockage. For example, blockage at the abdominal level (distal abdominal aorta) may result in buttock or hip pain. Lower leg arterial blockage may result in calf pain.  As PVD becomes more severe, pain can develop with less physical activity.  In people with severe PVD, leg pain may occur at rest.  Other PVD signs and symptoms:  Leg numbness or weakness.  Coldness in the affected leg or foot, especially when compared to the other leg.  A change in leg color.  Patients with significant PVD are more prone to ulcers or sores on toes, feet or legs. These may take longer to heal or may reoccur. The ulcers or sores can become infected.  If signs and symptoms of PVD are ignored, gangrene may occur. This can result in the loss of toes or loss of an entire limb.  Not all leg pain is related to PVD. Other medical conditions can cause leg pain such  as:  Blood clots (embolism) or Deep Vein Thrombosis.  Inflammation of the blood vessels (vasculitis).  Spinal stenosis. DIAGNOSIS  Diagnosis of PVD can involve several different types of tests. These can include:  Pulse Volume Recording Method (PVR). This test is simple, painless and does not involve the use of X-rays. PVR involves measuring and comparing the blood pressure in the arms and legs. An ABI (Ankle-Brachial Index) is calculated. The normal ratio of blood pressures is 1. As this number becomes smaller, it indicates more severe disease.  < 0.95  indicates significant narrowing in one or more leg vessels.  <0.8 there will usually be pain in the foot, leg or buttock with exercise.  <0.4 will usually have pain in the legs at rest.  <0.25  usually indicates limb threatening PVD.  Doppler detection of pulses in the legs. This test is painless and checks to see if you have a pulses in your legs/feet.  A dye or contrast material (a substance that highlights the blood vessels so they show up on x-ray) may be given to help your caregiver better see the arteries for the following tests. The dye is eliminated from your body by the kidney's. Your caregiver may order blood work to check your kidney function and other laboratory values before the following tests are performed:  Magnetic Resonance Angiography (MRA). An MRA is a picture study of the blood vessels and arteries. The MRA machine uses a large magnet to produce images of the blood vessels.  Computed Tomography Angiography (CTA). A CTA is a   specialized x-ray that looks at how the blood flows in your blood vessels. An IV may be inserted into your arm so contrast dye can be injected.  Angiogram. Is a procedure that uses x-rays to look at your blood vessels. This procedure is minimally invasive, meaning a small incision (cut) is made in your groin. A small tube (catheter) is then inserted into the artery of your groin. The catheter is  guided to the blood vessel or artery your caregiver wants to examine. Contrast dye is injected into the catheter. X-rays are then taken of the blood vessel or artery. After the images are obtained, the catheter is taken out. TREATMENT  Treatment of PVD involves many interventions which may include:  Lifestyle changes:  Quitting smoking.  Exercise.  Following a low fat, low cholesterol diet.  Control of diabetes.  Foot care is very important to the PVD patient. Good foot care can help prevent infection.  Medication:  Cholesterol-lowering medicine.  Blood pressure medicine.  Anti-platelet drugs.  Certain medicines may reduce symptoms of Intermittent Claudication.  Interventional/Surgical options:  Angioplasty. An Angioplasty is a procedure that inflates a balloon in the blocked artery. This opens the blocked artery to improve blood flow.  Stent Implant. A wire mesh tube (stent) is placed in the artery. The stent expands and stays in place, allowing the artery to remain open.  Peripheral Bypass Surgery. This is a surgical procedure that reroutes the blood around a blocked artery to help improve blood flow. This type of procedure may be performed if Angioplasty or stent implants are not an option. SEEK IMMEDIATE MEDICAL CARE IF:   You develop pain or numbness in your arms or legs.  Your arm or leg turns cold, becomes blue in color.  You develop redness, warmth, swelling and pain in your arms or legs. MAKE SURE YOU:   Understand these instructions.  Will watch your condition.  Will get help right away if you are not doing well or get worse. Document Released: 08/29/2004 Document Revised: 10/14/2011 Document Reviewed: 07/26/2008 [employer] Patient Information 2014 [employer].  

## 2014-03-18 DIAGNOSIS — I10 Essential (primary) hypertension: Secondary | ICD-10-CM | POA: Diagnosis not present

## 2014-03-18 DIAGNOSIS — E7800 Pure hypercholesterolemia, unspecified: Secondary | ICD-10-CM | POA: Diagnosis not present

## 2014-03-18 IMAGING — CR DG ANG/EXT/UNI/OR RIGHT
1 series · 1 of 1 positions shown · non-contrast
Comparison: None.

CLINICAL DATA: Arteriogram for peripheral vascular disease

RIGHT MARIKO/EXT/UNI/ OR
TECHNIQUE: Intraoperative spot image from an arteriogram.

[AP]
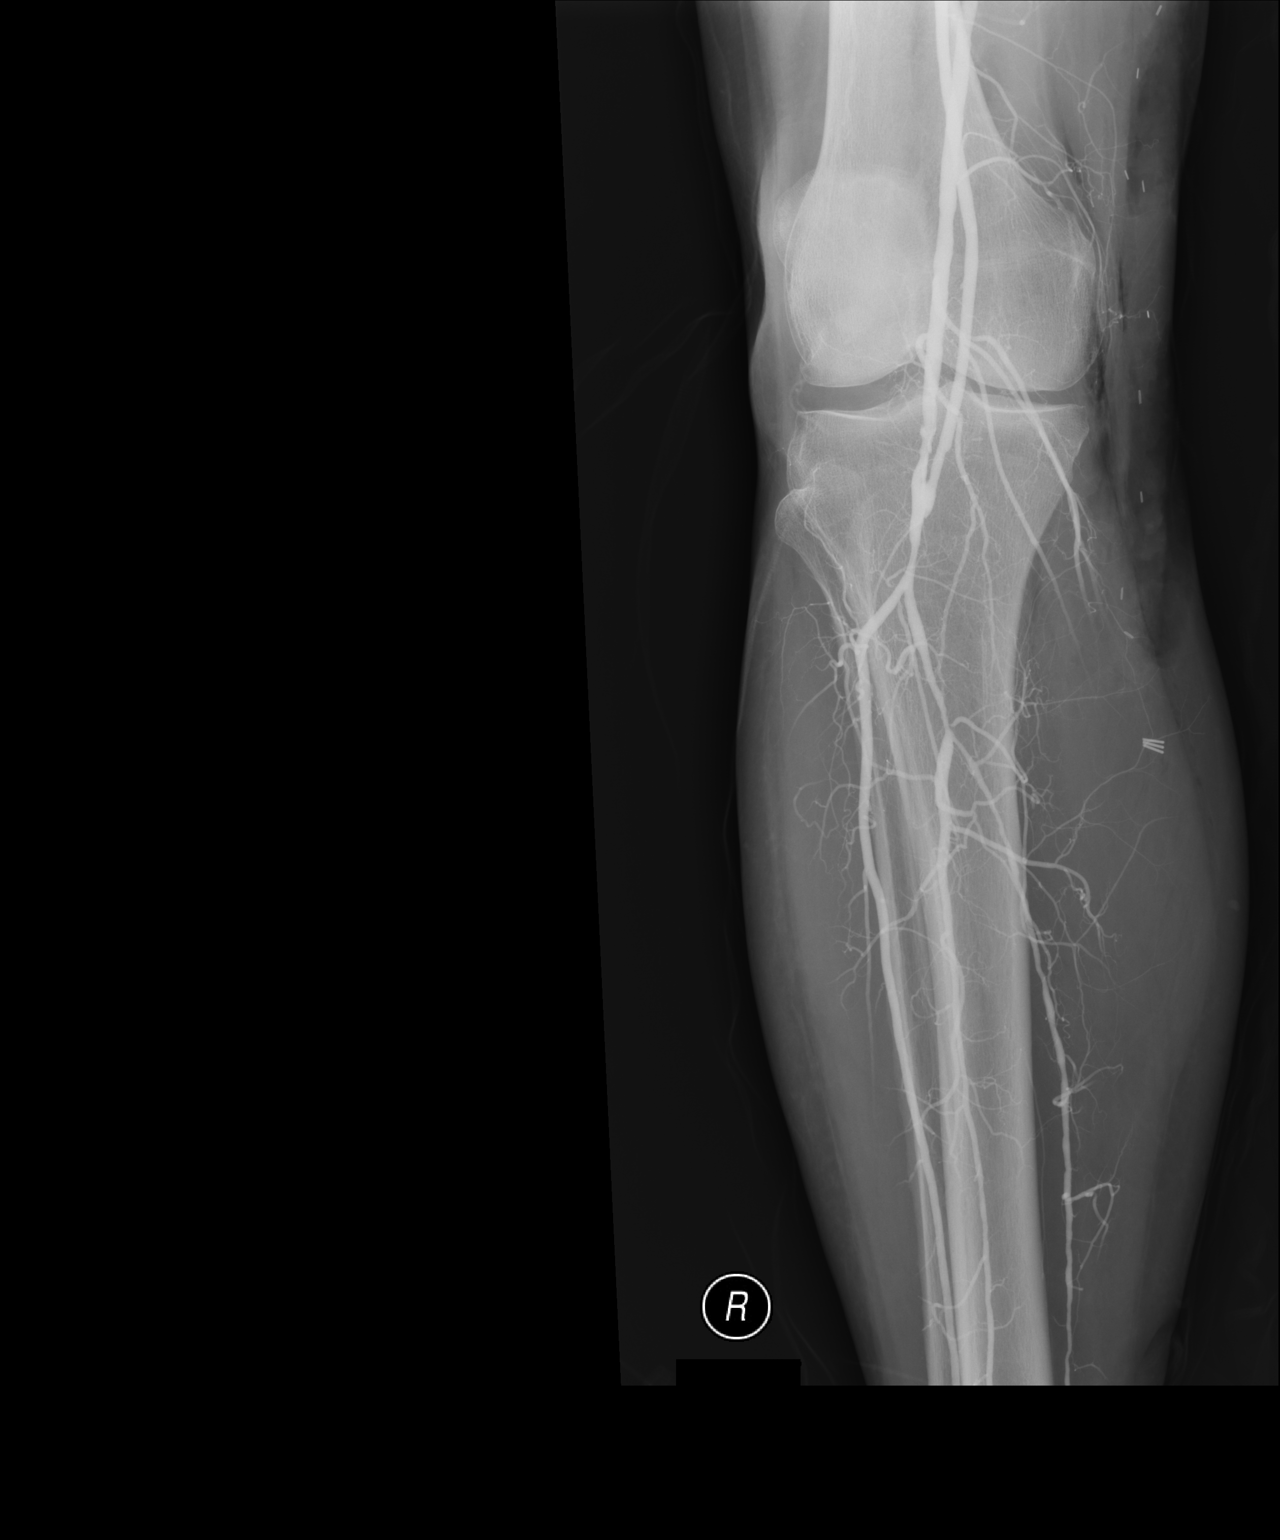

[1 of 1 positions shown; findings below may reference images not displayed]

FINDINGS: Contrast fills the distal superficial femoral artery and
popliteal artery.  A bypass graft to the popliteal artery below the
knee is patent.  Distal anastomosis is patent.  Minimal narrowing
of the popliteal artery below the distal anastomosis.  Anterior
tibial artery is patent.  Moderate narrowing of the tibioperoneal
trunk.  Posterior tibial artery is moderately diseased.  Peroneal
artery is widely patent.
IMPRESSION: Bypass graft is patent with three-vessel runoff as described.

## 2014-03-25 DIAGNOSIS — E7800 Pure hypercholesterolemia, unspecified: Secondary | ICD-10-CM | POA: Diagnosis not present

## 2014-03-25 DIAGNOSIS — I10 Essential (primary) hypertension: Secondary | ICD-10-CM | POA: Diagnosis not present

## 2014-03-25 DIAGNOSIS — K2100 Gastro-esophageal reflux disease with esophagitis, without bleeding: Secondary | ICD-10-CM | POA: Diagnosis not present

## 2014-03-25 DIAGNOSIS — I739 Peripheral vascular disease, unspecified: Secondary | ICD-10-CM | POA: Diagnosis not present

## 2014-04-25 DIAGNOSIS — D485 Neoplasm of uncertain behavior of skin: Secondary | ICD-10-CM | POA: Diagnosis not present

## 2014-04-25 DIAGNOSIS — L570 Actinic keratosis: Secondary | ICD-10-CM | POA: Diagnosis not present

## 2014-04-25 DIAGNOSIS — D1800 Hemangioma unspecified site: Secondary | ICD-10-CM | POA: Diagnosis not present

## 2014-06-13 DIAGNOSIS — H40023 Open angle with borderline findings, high risk, bilateral: Secondary | ICD-10-CM | POA: Diagnosis not present

## 2014-06-13 DIAGNOSIS — H35033 Hypertensive retinopathy, bilateral: Secondary | ICD-10-CM | POA: Diagnosis not present

## 2014-06-13 DIAGNOSIS — Z961 Presence of intraocular lens: Secondary | ICD-10-CM | POA: Diagnosis not present

## 2014-06-13 DIAGNOSIS — H35371 Puckering of macula, right eye: Secondary | ICD-10-CM | POA: Diagnosis not present

## 2014-07-06 ENCOUNTER — Encounter: Payer: Self-pay | Admitting: Family

## 2014-07-07 ENCOUNTER — Encounter: Payer: Self-pay | Admitting: Family

## 2014-07-07 ENCOUNTER — Ambulatory Visit (INDEPENDENT_AMBULATORY_CARE_PROVIDER_SITE_OTHER)
Admission: RE | Admit: 2014-07-07 | Discharge: 2014-07-07 | Disposition: A | Discharge status: Home / Self Care | Payer: Medicare Other | Source: Ambulatory Visit | Attending: Family | Admitting: Family

## 2014-07-07 ENCOUNTER — Ambulatory Visit (INDEPENDENT_AMBULATORY_CARE_PROVIDER_SITE_OTHER): Payer: Medicare Other | Admitting: Family

## 2014-07-07 ENCOUNTER — Ambulatory Visit (HOSPITAL_COMMUNITY)
Admission: RE | Admit: 2014-07-07 | Discharge: 2014-07-07 | Disposition: A | Discharge status: Home / Self Care | Payer: Medicare Other | Source: Ambulatory Visit | Attending: Family | Admitting: Family

## 2014-07-07 VITALS — BP 177/99 | HR 72 | Resp 16 | Ht 66.0 in | Wt 166.0 lb

## 2014-07-07 DIAGNOSIS — I739 Peripheral vascular disease, unspecified: Secondary | ICD-10-CM

## 2014-07-07 DIAGNOSIS — Z48812 Encounter for surgical aftercare following surgery on the circulatory system: Secondary | ICD-10-CM

## 2014-07-07 DIAGNOSIS — I70219 Atherosclerosis of native arteries of extremities with intermittent claudication, unspecified extremity: Secondary | ICD-10-CM

## 2014-07-07 NOTE — Patient Instructions (Signed)
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
## 2014-07-14 ENCOUNTER — Encounter (HOSPITAL_COMMUNITY): Payer: Self-pay | Admitting: Vascular Surgery

## 2014-07-18 DIAGNOSIS — I10 Essential (primary) hypertension: Secondary | ICD-10-CM | POA: Diagnosis not present

## 2014-07-18 DIAGNOSIS — E780 Pure hypercholesterolemia: Secondary | ICD-10-CM | POA: Diagnosis not present

## 2014-07-25 DIAGNOSIS — I10 Essential (primary) hypertension: Secondary | ICD-10-CM | POA: Diagnosis not present

## 2014-07-25 DIAGNOSIS — K210 Gastro-esophageal reflux disease with esophagitis: Secondary | ICD-10-CM | POA: Diagnosis not present

## 2014-07-25 DIAGNOSIS — E782 Mixed hyperlipidemia: Secondary | ICD-10-CM | POA: Diagnosis not present

## 2014-07-25 DIAGNOSIS — Z23 Encounter for immunization: Secondary | ICD-10-CM | POA: Diagnosis not present

## 2014-08-18 ENCOUNTER — Ambulatory Visit (INDEPENDENT_AMBULATORY_CARE_PROVIDER_SITE_OTHER): Payer: Medicare Other | Admitting: Ophthalmology

## 2014-08-18 DIAGNOSIS — D3131 Benign neoplasm of right choroid: Secondary | ICD-10-CM

## 2014-08-18 DIAGNOSIS — H35033 Hypertensive retinopathy, bilateral: Secondary | ICD-10-CM

## 2014-08-18 DIAGNOSIS — H34831 Tributary (branch) retinal vein occlusion, right eye: Secondary | ICD-10-CM

## 2014-08-18 DIAGNOSIS — D3132 Benign neoplasm of left choroid: Secondary | ICD-10-CM

## 2014-08-18 DIAGNOSIS — I10 Essential (primary) hypertension: Secondary | ICD-10-CM | POA: Diagnosis not present

## 2014-08-18 DIAGNOSIS — H43812 Vitreous degeneration, left eye: Secondary | ICD-10-CM

## 2014-10-13 ENCOUNTER — Encounter (INDEPENDENT_AMBULATORY_CARE_PROVIDER_SITE_OTHER): Payer: Medicare Other | Admitting: Ophthalmology

## 2014-10-13 DIAGNOSIS — H35033 Hypertensive retinopathy, bilateral: Secondary | ICD-10-CM | POA: Diagnosis not present

## 2014-10-13 DIAGNOSIS — I10 Essential (primary) hypertension: Secondary | ICD-10-CM | POA: Diagnosis not present

## 2014-10-13 DIAGNOSIS — H34831 Tributary (branch) retinal vein occlusion, right eye: Secondary | ICD-10-CM | POA: Diagnosis not present

## 2014-10-13 DIAGNOSIS — D3132 Benign neoplasm of left choroid: Secondary | ICD-10-CM | POA: Diagnosis not present

## 2014-10-13 DIAGNOSIS — H2512 Age-related nuclear cataract, left eye: Secondary | ICD-10-CM | POA: Diagnosis not present

## 2014-10-13 DIAGNOSIS — D3131 Benign neoplasm of right choroid: Secondary | ICD-10-CM | POA: Diagnosis not present

## 2014-10-13 DIAGNOSIS — H43812 Vitreous degeneration, left eye: Secondary | ICD-10-CM | POA: Diagnosis not present

## 2014-12-08 ENCOUNTER — Encounter (INDEPENDENT_AMBULATORY_CARE_PROVIDER_SITE_OTHER): Payer: Medicare Other | Admitting: Ophthalmology

## 2014-12-08 DIAGNOSIS — D3131 Benign neoplasm of right choroid: Secondary | ICD-10-CM

## 2014-12-08 DIAGNOSIS — H2512 Age-related nuclear cataract, left eye: Secondary | ICD-10-CM | POA: Diagnosis not present

## 2014-12-08 DIAGNOSIS — H43812 Vitreous degeneration, left eye: Secondary | ICD-10-CM

## 2014-12-08 DIAGNOSIS — D3132 Benign neoplasm of left choroid: Secondary | ICD-10-CM | POA: Diagnosis not present

## 2014-12-08 DIAGNOSIS — H34831 Tributary (branch) retinal vein occlusion, right eye: Secondary | ICD-10-CM | POA: Diagnosis not present

## 2014-12-08 DIAGNOSIS — I10 Essential (primary) hypertension: Secondary | ICD-10-CM | POA: Diagnosis not present

## 2014-12-08 DIAGNOSIS — H35033 Hypertensive retinopathy, bilateral: Secondary | ICD-10-CM

## 2014-12-12 ENCOUNTER — Encounter (INDEPENDENT_AMBULATORY_CARE_PROVIDER_SITE_OTHER): Payer: Medicare Other | Admitting: Ophthalmology

## 2015-01-16 DIAGNOSIS — K210 Gastro-esophageal reflux disease with esophagitis: Secondary | ICD-10-CM | POA: Diagnosis not present

## 2015-01-16 DIAGNOSIS — I10 Essential (primary) hypertension: Secondary | ICD-10-CM | POA: Diagnosis not present

## 2015-01-16 DIAGNOSIS — E780 Pure hypercholesterolemia: Secondary | ICD-10-CM | POA: Diagnosis not present

## 2015-01-16 DIAGNOSIS — E782 Mixed hyperlipidemia: Secondary | ICD-10-CM | POA: Diagnosis not present

## 2015-01-23 DIAGNOSIS — N183 Chronic kidney disease, stage 3 (moderate): Secondary | ICD-10-CM | POA: Diagnosis not present

## 2015-01-23 DIAGNOSIS — Z23 Encounter for immunization: Secondary | ICD-10-CM | POA: Diagnosis not present

## 2015-01-23 DIAGNOSIS — I10 Essential (primary) hypertension: Secondary | ICD-10-CM | POA: Diagnosis not present

## 2015-01-23 DIAGNOSIS — R7301 Impaired fasting glucose: Secondary | ICD-10-CM | POA: Diagnosis not present

## 2015-01-23 DIAGNOSIS — K210 Gastro-esophageal reflux disease with esophagitis: Secondary | ICD-10-CM | POA: Diagnosis not present

## 2015-01-23 DIAGNOSIS — E782 Mixed hyperlipidemia: Secondary | ICD-10-CM | POA: Diagnosis not present

## 2015-01-30 ENCOUNTER — Other Ambulatory Visit: Payer: Self-pay

## 2015-02-16 ENCOUNTER — Encounter (INDEPENDENT_AMBULATORY_CARE_PROVIDER_SITE_OTHER): Payer: Medicare Other | Admitting: Ophthalmology

## 2015-02-16 DIAGNOSIS — D3132 Benign neoplasm of left choroid: Secondary | ICD-10-CM | POA: Diagnosis not present

## 2015-02-16 DIAGNOSIS — H43812 Vitreous degeneration, left eye: Secondary | ICD-10-CM

## 2015-02-16 DIAGNOSIS — I10 Essential (primary) hypertension: Secondary | ICD-10-CM

## 2015-02-16 DIAGNOSIS — H34831 Tributary (branch) retinal vein occlusion, right eye: Secondary | ICD-10-CM

## 2015-02-16 DIAGNOSIS — D3131 Benign neoplasm of right choroid: Secondary | ICD-10-CM

## 2015-02-16 DIAGNOSIS — H35033 Hypertensive retinopathy, bilateral: Secondary | ICD-10-CM

## 2015-04-26 DIAGNOSIS — L821 Other seborrheic keratosis: Secondary | ICD-10-CM | POA: Diagnosis not present

## 2015-04-26 DIAGNOSIS — L570 Actinic keratosis: Secondary | ICD-10-CM | POA: Diagnosis not present

## 2015-05-11 ENCOUNTER — Encounter (INDEPENDENT_AMBULATORY_CARE_PROVIDER_SITE_OTHER): Payer: Medicare Other | Admitting: Ophthalmology

## 2015-05-11 DIAGNOSIS — H34831 Tributary (branch) retinal vein occlusion, right eye, with macular edema: Secondary | ICD-10-CM | POA: Diagnosis not present

## 2015-05-11 DIAGNOSIS — H43812 Vitreous degeneration, left eye: Secondary | ICD-10-CM

## 2015-05-11 DIAGNOSIS — H35033 Hypertensive retinopathy, bilateral: Secondary | ICD-10-CM | POA: Diagnosis not present

## 2015-05-11 DIAGNOSIS — D3131 Benign neoplasm of right choroid: Secondary | ICD-10-CM

## 2015-05-11 DIAGNOSIS — I10 Essential (primary) hypertension: Secondary | ICD-10-CM

## 2015-05-11 DIAGNOSIS — D3132 Benign neoplasm of left choroid: Secondary | ICD-10-CM

## 2015-06-21 DIAGNOSIS — Z961 Presence of intraocular lens: Secondary | ICD-10-CM | POA: Diagnosis not present

## 2015-06-21 DIAGNOSIS — H40023 Open angle with borderline findings, high risk, bilateral: Secondary | ICD-10-CM | POA: Diagnosis not present

## 2015-06-21 DIAGNOSIS — H2512 Age-related nuclear cataract, left eye: Secondary | ICD-10-CM | POA: Diagnosis not present

## 2015-06-21 DIAGNOSIS — H35371 Puckering of macula, right eye: Secondary | ICD-10-CM | POA: Diagnosis not present

## 2015-07-04 DIAGNOSIS — R05 Cough: Secondary | ICD-10-CM | POA: Diagnosis not present

## 2015-07-04 DIAGNOSIS — J209 Acute bronchitis, unspecified: Secondary | ICD-10-CM | POA: Diagnosis not present

## 2015-07-05 DIAGNOSIS — E782 Mixed hyperlipidemia: Secondary | ICD-10-CM | POA: Diagnosis not present

## 2015-07-05 DIAGNOSIS — R7301 Impaired fasting glucose: Secondary | ICD-10-CM | POA: Diagnosis not present

## 2015-07-05 DIAGNOSIS — N183 Chronic kidney disease, stage 3 (moderate): Secondary | ICD-10-CM | POA: Diagnosis not present

## 2015-07-05 DIAGNOSIS — K210 Gastro-esophageal reflux disease with esophagitis: Secondary | ICD-10-CM | POA: Diagnosis not present

## 2015-07-05 DIAGNOSIS — I10 Essential (primary) hypertension: Secondary | ICD-10-CM | POA: Diagnosis not present

## 2015-07-06 DIAGNOSIS — H40021 Open angle with borderline findings, high risk, right eye: Secondary | ICD-10-CM | POA: Diagnosis not present

## 2015-07-10 ENCOUNTER — Encounter: Payer: Self-pay | Admitting: Family

## 2015-07-11 ENCOUNTER — Encounter: Payer: Self-pay | Admitting: Family

## 2015-07-11 ENCOUNTER — Ambulatory Visit (HOSPITAL_COMMUNITY)
Admission: RE | Admit: 2015-07-11 | Discharge: 2015-07-11 | Disposition: A | Discharge status: Home / Self Care | Payer: Medicare Other | Source: Ambulatory Visit | Attending: Family | Admitting: Family

## 2015-07-11 ENCOUNTER — Ambulatory Visit (INDEPENDENT_AMBULATORY_CARE_PROVIDER_SITE_OTHER)
Admission: RE | Admit: 2015-07-11 | Discharge: 2015-07-11 | Disposition: A | Discharge status: Home / Self Care | Payer: Medicare Other | Source: Ambulatory Visit | Attending: Family | Admitting: Family

## 2015-07-11 ENCOUNTER — Ambulatory Visit (INDEPENDENT_AMBULATORY_CARE_PROVIDER_SITE_OTHER): Payer: Medicare Other | Admitting: Family

## 2015-07-11 ENCOUNTER — Other Ambulatory Visit: Payer: Self-pay | Admitting: Family

## 2015-07-11 VITALS — BP 165/105 | HR 113 | Ht 66.0 in | Wt 166.1 lb

## 2015-07-11 DIAGNOSIS — Z95828 Presence of other vascular implants and grafts: Secondary | ICD-10-CM | POA: Diagnosis not present

## 2015-07-11 DIAGNOSIS — Z48812 Encounter for surgical aftercare following surgery on the circulatory system: Secondary | ICD-10-CM | POA: Diagnosis not present

## 2015-07-11 DIAGNOSIS — I739 Peripheral vascular disease, unspecified: Secondary | ICD-10-CM | POA: Diagnosis not present

## 2015-07-11 DIAGNOSIS — I779 Disorder of arteries and arterioles, unspecified: Secondary | ICD-10-CM

## 2015-07-11 DIAGNOSIS — I10 Essential (primary) hypertension: Secondary | ICD-10-CM | POA: Insufficient documentation

## 2015-07-11 NOTE — Patient Instructions (Signed)
Peripheral Vascular Disease Peripheral vascular disease (PVD) is a disease of the blood vessels that are not part of your heart and brain. A simple term for PVD is poor circulation. In most cases, PVD narrows the blood vessels that carry blood from your heart to the rest of your body. This can result in a decreased supply of blood to your arms, legs, and internal organs, like your stomach or kidneys. However, it most often affects a person's lower legs and feet. There are two types of PVD.  Organic PVD. This is the more common type. It is caused by damage to the structure of blood vessels.  Functional PVD. This is caused by conditions that make blood vessels contract and tighten (spasm). Without treatment, PVD tends to get worse over time. PVD can also lead to acute ischemic limb. This is when an arm or limb suddenly has trouble getting enough blood. This is a medical emergency. CAUSES Each type of PVD has many different causes. The most common cause of PVD is buildup of a fatty material (plaque) inside of your arteries (atherosclerosis). Small amounts of plaque can break off from the walls of the blood vessels and become lodged in a smaller artery. This blocks blood flow and can cause acute ischemic limb. Other common causes of PVD include:  Blood clots that form inside of blood vessels.  Injuries to blood vessels.  Diseases that cause inflammation of blood vessels or cause blood vessel spasms.  Health behaviors and health history that increase your risk of developing PVD. RISK FACTORS  You may have a greater risk of PVD if you:  Have a family history of PVD.  Have certain medical conditions, including:  High cholesterol.  Diabetes.  High blood pressure (hypertension).  Coronary heart disease.  Past problems with blood clots.  Past injury, such as burns or a broken bone. These may have damaged blood vessels in your limbs.  Buerger disease. This is caused by inflamed blood  vessels in your hands and feet.  Some forms of arthritis.  Rare birth defects that affect the arteries in your legs.  Use tobacco.  Do not get enough exercise.  Are obese.  Are age 50 or older. SIGNS AND SYMPTOMS  PVD may cause many different symptoms. Your symptoms depend on what part of your body is not getting enough blood. Some common signs and symptoms include:  Cramps in your lower legs. This may be a symptom of poor leg circulation (claudication).  Pain and weakness in your legs while you are physically active that goes away when you rest (intermittent claudication).  Leg pain when at rest.  Leg numbness, tingling, or weakness.  Coldness in a leg or foot, especially when compared with the other leg.  Skin or hair changes. These can include:  Hair loss.  Shiny skin.  Pale or bluish skin.  Thick toenails.  Inability to get or maintain an erection (erectile dysfunction). People with PVD are more prone to developing ulcers and sores on their toes, feet, or legs. These may take longer than normal to heal. DIAGNOSIS Your health care provider may diagnose PVD from your signs and symptoms. The health care provider will also do a physical exam. You may have tests to find out what is causing your PVD and determine its severity. Tests may include:  Blood pressure recordings from your arms and legs and measurements of the strength of your pulses (pulse volume recordings).  Imaging studies using sound waves to take pictures of   the blood flow through your blood vessels (Doppler ultrasound).  Injecting a dye into your blood vessels before having imaging studies using:  X-rays (angiogram or arteriogram).  Computer-generated X-rays (CT angiogram).  A powerful electromagnetic field and a computer (magnetic resonance angiogram or MRA). TREATMENT Treatment for PVD depends on the cause of your condition and the severity of your symptoms. It also depends on your age. Underlying  causes need to be treated and controlled. These include long-lasting (chronic) conditions, such as diabetes, high cholesterol, and high blood pressure. You may need to first try making lifestyle changes and taking medicines. Surgery may be needed if these do not work. Lifestyle changes may include:  Quitting smoking.  Exercising regularly.  Following a low-fat, low-cholesterol diet. Medicines may include:  Blood thinners to prevent blood clots.  Medicines to improve blood flow.  Medicines to improve your blood cholesterol levels. Surgical procedures may include:  A procedure that uses an inflated balloon to open a blocked artery and improve blood flow (angioplasty).  A procedure to put in a tube (stent) to keep a blocked artery open (stent implant).  Surgery to reroute blood flow around a blocked artery (peripheral bypass surgery).  Surgery to remove dead tissue from an infected wound on the affected limb.  Amputation. This is surgical removal of the affected limb. This may be necessary in cases of acute ischemic limb that are not improved through medical or surgical treatments. HOME CARE INSTRUCTIONS  Take medicines only as directed by your health care provider.  Do not use any tobacco products, including cigarettes, chewing tobacco, or electronic cigarettes. If you need help quitting, ask your health care provider.  Lose weight if you are overweight, and maintain a healthy weight as directed by your health care provider.  Eat a diet that is low in fat and cholesterol. If you need help, ask your health care provider.  Exercise regularly. Ask your health care provider to suggest some good activities for you.  Use compression stockings or other mechanical devices as directed by your health care provider.  Take good care of your feet.  Wear comfortable shoes that fit well.  Check your feet often for any cuts or sores. SEEK MEDICAL CARE IF:  You have cramps in your legs  while walking.  You have leg pain when you are at rest.  You have coldness in a leg or foot.  Your skin changes.  You have erectile dysfunction.  You have cuts or sores on your feet that are not healing. SEEK IMMEDIATE MEDICAL CARE IF:  Your arm or leg turns cold and blue.  Your arms or legs become red, warm, swollen, painful, or numb.  You have chest pain or trouble breathing.  You suddenly have weakness in your face, arm, or leg.  You become very confused or lose the ability to speak.  You suddenly have a very bad headache or lose your vision.   This information is not intended to replace advice given to you by your health care provider. Make sure you discuss any questions you have with your health care provider.   Document Released: 08/29/2004 Document Revised: 08/12/2014 Document Reviewed: 12/30/2013 Elsevier Interactive Patient Education 2016 [employer] Inc[employer]  
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
## 2015-07-12 DIAGNOSIS — R7301 Impaired fasting glucose: Secondary | ICD-10-CM | POA: Diagnosis not present

## 2015-07-12 DIAGNOSIS — E782 Mixed hyperlipidemia: Secondary | ICD-10-CM | POA: Diagnosis not present

## 2015-07-12 DIAGNOSIS — J3089 Other allergic rhinitis: Secondary | ICD-10-CM | POA: Diagnosis not present

## 2015-07-12 DIAGNOSIS — E8881 Metabolic syndrome: Secondary | ICD-10-CM | POA: Diagnosis not present

## 2015-07-12 DIAGNOSIS — N183 Chronic kidney disease, stage 3 (moderate): Secondary | ICD-10-CM | POA: Diagnosis not present

## 2015-07-12 DIAGNOSIS — K210 Gastro-esophageal reflux disease with esophagitis: Secondary | ICD-10-CM | POA: Diagnosis not present

## 2015-07-12 DIAGNOSIS — I10 Essential (primary) hypertension: Secondary | ICD-10-CM | POA: Diagnosis not present

## 2015-07-12 NOTE — Addendum Note (Signed)
Addended by: Thresa Ross C on: 07/12/2015 02:09 PM   Modules accepted: Orders

## 2015-08-17 ENCOUNTER — Encounter (INDEPENDENT_AMBULATORY_CARE_PROVIDER_SITE_OTHER): Payer: Medicare Other | Admitting: Ophthalmology

## 2015-08-17 DIAGNOSIS — H2512 Age-related nuclear cataract, left eye: Secondary | ICD-10-CM | POA: Diagnosis not present

## 2015-08-17 DIAGNOSIS — H34831 Tributary (branch) retinal vein occlusion, right eye, with macular edema: Secondary | ICD-10-CM

## 2015-08-17 DIAGNOSIS — D3132 Benign neoplasm of left choroid: Secondary | ICD-10-CM | POA: Diagnosis not present

## 2015-08-17 DIAGNOSIS — H35033 Hypertensive retinopathy, bilateral: Secondary | ICD-10-CM | POA: Diagnosis not present

## 2015-08-17 DIAGNOSIS — I10 Essential (primary) hypertension: Secondary | ICD-10-CM | POA: Diagnosis not present

## 2015-08-17 DIAGNOSIS — D3131 Benign neoplasm of right choroid: Secondary | ICD-10-CM

## 2015-08-17 DIAGNOSIS — H43813 Vitreous degeneration, bilateral: Secondary | ICD-10-CM | POA: Diagnosis not present

## 2015-08-23 DIAGNOSIS — H40023 Open angle with borderline findings, high risk, bilateral: Secondary | ICD-10-CM | POA: Diagnosis not present

## 2015-11-06 DIAGNOSIS — E7800 Pure hypercholesterolemia, unspecified: Secondary | ICD-10-CM | POA: Diagnosis not present

## 2015-11-06 DIAGNOSIS — E8881 Metabolic syndrome: Secondary | ICD-10-CM | POA: Diagnosis not present

## 2015-11-06 DIAGNOSIS — K210 Gastro-esophageal reflux disease with esophagitis: Secondary | ICD-10-CM | POA: Diagnosis not present

## 2015-11-06 DIAGNOSIS — N183 Chronic kidney disease, stage 3 (moderate): Secondary | ICD-10-CM | POA: Diagnosis not present

## 2015-11-06 DIAGNOSIS — R7301 Impaired fasting glucose: Secondary | ICD-10-CM | POA: Diagnosis not present

## 2015-11-06 DIAGNOSIS — I10 Essential (primary) hypertension: Secondary | ICD-10-CM | POA: Diagnosis not present

## 2015-11-06 DIAGNOSIS — E782 Mixed hyperlipidemia: Secondary | ICD-10-CM | POA: Diagnosis not present

## 2015-11-09 DIAGNOSIS — K210 Gastro-esophageal reflux disease with esophagitis: Secondary | ICD-10-CM | POA: Diagnosis not present

## 2015-11-09 DIAGNOSIS — Z1389 Encounter for screening for other disorder: Secondary | ICD-10-CM | POA: Diagnosis not present

## 2015-11-09 DIAGNOSIS — E8881 Metabolic syndrome: Secondary | ICD-10-CM | POA: Diagnosis not present

## 2015-11-09 DIAGNOSIS — R05 Cough: Secondary | ICD-10-CM | POA: Diagnosis not present

## 2015-11-09 DIAGNOSIS — I10 Essential (primary) hypertension: Secondary | ICD-10-CM | POA: Diagnosis not present

## 2015-11-09 DIAGNOSIS — R7301 Impaired fasting glucose: Secondary | ICD-10-CM | POA: Diagnosis not present

## 2015-11-09 DIAGNOSIS — E782 Mixed hyperlipidemia: Secondary | ICD-10-CM | POA: Diagnosis not present

## 2015-11-09 DIAGNOSIS — N183 Chronic kidney disease, stage 3 (moderate): Secondary | ICD-10-CM | POA: Diagnosis not present

## 2015-11-23 ENCOUNTER — Encounter (INDEPENDENT_AMBULATORY_CARE_PROVIDER_SITE_OTHER): Payer: Medicare Other | Admitting: Ophthalmology

## 2015-11-23 DIAGNOSIS — H35033 Hypertensive retinopathy, bilateral: Secondary | ICD-10-CM

## 2015-11-23 DIAGNOSIS — I10 Essential (primary) hypertension: Secondary | ICD-10-CM | POA: Diagnosis not present

## 2015-11-23 DIAGNOSIS — D3131 Benign neoplasm of right choroid: Secondary | ICD-10-CM | POA: Diagnosis not present

## 2015-11-23 DIAGNOSIS — H43812 Vitreous degeneration, left eye: Secondary | ICD-10-CM

## 2015-11-23 DIAGNOSIS — H35371 Puckering of macula, right eye: Secondary | ICD-10-CM

## 2015-11-23 DIAGNOSIS — D3132 Benign neoplasm of left choroid: Secondary | ICD-10-CM | POA: Diagnosis not present

## 2015-11-23 DIAGNOSIS — H34831 Tributary (branch) retinal vein occlusion, right eye, with macular edema: Secondary | ICD-10-CM

## 2015-12-14 DIAGNOSIS — Z961 Presence of intraocular lens: Secondary | ICD-10-CM | POA: Diagnosis not present

## 2015-12-14 DIAGNOSIS — H25012 Cortical age-related cataract, left eye: Secondary | ICD-10-CM | POA: Diagnosis not present

## 2015-12-14 DIAGNOSIS — H40023 Open angle with borderline findings, high risk, bilateral: Secondary | ICD-10-CM | POA: Diagnosis not present

## 2015-12-14 DIAGNOSIS — H2512 Age-related nuclear cataract, left eye: Secondary | ICD-10-CM | POA: Diagnosis not present

## 2016-01-08 DIAGNOSIS — M9905 Segmental and somatic dysfunction of pelvic region: Secondary | ICD-10-CM | POA: Diagnosis not present

## 2016-01-08 DIAGNOSIS — M5137 Other intervertebral disc degeneration, lumbosacral region: Secondary | ICD-10-CM | POA: Diagnosis not present

## 2016-01-10 DIAGNOSIS — M5137 Other intervertebral disc degeneration, lumbosacral region: Secondary | ICD-10-CM | POA: Diagnosis not present

## 2016-01-10 DIAGNOSIS — M9905 Segmental and somatic dysfunction of pelvic region: Secondary | ICD-10-CM | POA: Diagnosis not present

## 2016-01-11 DIAGNOSIS — M5137 Other intervertebral disc degeneration, lumbosacral region: Secondary | ICD-10-CM | POA: Diagnosis not present

## 2016-01-11 DIAGNOSIS — M9905 Segmental and somatic dysfunction of pelvic region: Secondary | ICD-10-CM | POA: Diagnosis not present

## 2016-01-15 DIAGNOSIS — M5137 Other intervertebral disc degeneration, lumbosacral region: Secondary | ICD-10-CM | POA: Diagnosis not present

## 2016-01-15 DIAGNOSIS — M9905 Segmental and somatic dysfunction of pelvic region: Secondary | ICD-10-CM | POA: Diagnosis not present

## 2016-01-17 DIAGNOSIS — M9905 Segmental and somatic dysfunction of pelvic region: Secondary | ICD-10-CM | POA: Diagnosis not present

## 2016-01-17 DIAGNOSIS — M5137 Other intervertebral disc degeneration, lumbosacral region: Secondary | ICD-10-CM | POA: Diagnosis not present

## 2016-01-18 DIAGNOSIS — M5137 Other intervertebral disc degeneration, lumbosacral region: Secondary | ICD-10-CM | POA: Diagnosis not present

## 2016-01-18 DIAGNOSIS — M9905 Segmental and somatic dysfunction of pelvic region: Secondary | ICD-10-CM | POA: Diagnosis not present

## 2016-01-22 DIAGNOSIS — M5137 Other intervertebral disc degeneration, lumbosacral region: Secondary | ICD-10-CM | POA: Diagnosis not present

## 2016-01-22 DIAGNOSIS — M9905 Segmental and somatic dysfunction of pelvic region: Secondary | ICD-10-CM | POA: Diagnosis not present

## 2016-01-24 DIAGNOSIS — M9905 Segmental and somatic dysfunction of pelvic region: Secondary | ICD-10-CM | POA: Diagnosis not present

## 2016-01-24 DIAGNOSIS — M5137 Other intervertebral disc degeneration, lumbosacral region: Secondary | ICD-10-CM | POA: Diagnosis not present

## 2016-01-25 DIAGNOSIS — M9905 Segmental and somatic dysfunction of pelvic region: Secondary | ICD-10-CM | POA: Diagnosis not present

## 2016-01-25 DIAGNOSIS — M5137 Other intervertebral disc degeneration, lumbosacral region: Secondary | ICD-10-CM | POA: Diagnosis not present

## 2016-01-29 DIAGNOSIS — M9905 Segmental and somatic dysfunction of pelvic region: Secondary | ICD-10-CM | POA: Diagnosis not present

## 2016-01-29 DIAGNOSIS — M5137 Other intervertebral disc degeneration, lumbosacral region: Secondary | ICD-10-CM | POA: Diagnosis not present

## 2016-01-31 DIAGNOSIS — M5137 Other intervertebral disc degeneration, lumbosacral region: Secondary | ICD-10-CM | POA: Diagnosis not present

## 2016-01-31 DIAGNOSIS — M9905 Segmental and somatic dysfunction of pelvic region: Secondary | ICD-10-CM | POA: Diagnosis not present

## 2016-02-29 ENCOUNTER — Encounter (INDEPENDENT_AMBULATORY_CARE_PROVIDER_SITE_OTHER): Payer: Medicare Other | Admitting: Ophthalmology

## 2016-02-29 DIAGNOSIS — H35372 Puckering of macula, left eye: Secondary | ICD-10-CM

## 2016-02-29 DIAGNOSIS — D3131 Benign neoplasm of right choroid: Secondary | ICD-10-CM | POA: Diagnosis not present

## 2016-02-29 DIAGNOSIS — H35033 Hypertensive retinopathy, bilateral: Secondary | ICD-10-CM | POA: Diagnosis not present

## 2016-02-29 DIAGNOSIS — H43813 Vitreous degeneration, bilateral: Secondary | ICD-10-CM | POA: Diagnosis not present

## 2016-02-29 DIAGNOSIS — I10 Essential (primary) hypertension: Secondary | ICD-10-CM

## 2016-02-29 DIAGNOSIS — H2512 Age-related nuclear cataract, left eye: Secondary | ICD-10-CM

## 2016-02-29 DIAGNOSIS — D3132 Benign neoplasm of left choroid: Secondary | ICD-10-CM | POA: Diagnosis not present

## 2016-02-29 DIAGNOSIS — H34831 Tributary (branch) retinal vein occlusion, right eye, with macular edema: Secondary | ICD-10-CM | POA: Diagnosis not present

## 2016-03-26 ENCOUNTER — Ambulatory Visit (INDEPENDENT_AMBULATORY_CARE_PROVIDER_SITE_OTHER): Payer: Medicare Other | Admitting: Internal Medicine

## 2016-03-26 ENCOUNTER — Encounter: Payer: Self-pay | Admitting: Internal Medicine

## 2016-03-26 ENCOUNTER — Other Ambulatory Visit: Payer: Self-pay

## 2016-03-26 VITALS — BP 156/78 | HR 62 | Temp 97.2°F | Ht 66.0 in | Wt 168.6 lb

## 2016-03-26 DIAGNOSIS — Z86010 Personal history of colonic polyps: Secondary | ICD-10-CM

## 2016-03-26 DIAGNOSIS — K219 Gastro-esophageal reflux disease without esophagitis: Secondary | ICD-10-CM

## 2016-03-26 MED ORDER — PEG-KCL-NACL-NASULF-NA ASC-C 100 G PO SOLR: 1.0000 | Freq: Once | ORAL | 0 refills | Status: DC

## 2016-03-26 NOTE — Patient Instructions (Signed)
Schedule a surveillance colonoscopy - hx of polyps  Continue omeprazole daily - as discussed, the benenfits outweigh the risks  Further recommendations to follow
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
## 2016-04-02 ENCOUNTER — Encounter (HOSPITAL_COMMUNITY): Payer: Self-pay | Admitting: *Deleted

## 2016-04-02 ENCOUNTER — Ambulatory Visit (HOSPITAL_COMMUNITY)
Admission: RE | Admit: 2016-04-02 | Discharge: 2016-04-02 | Disposition: A | Discharge status: Home / Self Care | Payer: Medicare Other | Source: Ambulatory Visit | Attending: Internal Medicine | Admitting: Internal Medicine

## 2016-04-02 ENCOUNTER — Encounter (HOSPITAL_COMMUNITY)
Admission: RE | Disposition: A | Discharge status: Home / Self Care | Payer: Self-pay | Source: Ambulatory Visit | Attending: Internal Medicine

## 2016-04-02 DIAGNOSIS — Z79899 Other long term (current) drug therapy: Secondary | ICD-10-CM | POA: Diagnosis not present

## 2016-04-02 DIAGNOSIS — K219 Gastro-esophageal reflux disease without esophagitis: Secondary | ICD-10-CM | POA: Insufficient documentation

## 2016-04-02 DIAGNOSIS — I739 Peripheral vascular disease, unspecified: Secondary | ICD-10-CM | POA: Insufficient documentation

## 2016-04-02 DIAGNOSIS — Z1211 Encounter for screening for malignant neoplasm of colon: Secondary | ICD-10-CM | POA: Insufficient documentation

## 2016-04-02 DIAGNOSIS — I10 Essential (primary) hypertension: Secondary | ICD-10-CM | POA: Diagnosis not present

## 2016-04-02 DIAGNOSIS — Z7982 Long term (current) use of aspirin: Secondary | ICD-10-CM | POA: Insufficient documentation

## 2016-04-02 DIAGNOSIS — K635 Polyp of colon: Secondary | ICD-10-CM | POA: Diagnosis not present

## 2016-04-02 DIAGNOSIS — Z86010 Personal history of colonic polyps: Secondary | ICD-10-CM | POA: Diagnosis not present

## 2016-04-02 DIAGNOSIS — K5730 Diverticulosis of large intestine without perforation or abscess without bleeding: Secondary | ICD-10-CM | POA: Insufficient documentation

## 2016-04-02 HISTORY — PX: COLONOSCOPY: SHX5424

## 2016-04-02 SURGERY — COLONOSCOPY
Anesthesia: Moderate Sedation

## 2016-04-02 MED ORDER — ONDANSETRON HCL 4 MG/2ML IJ SOLN
INTRAMUSCULAR | Status: AC
  Filled 2016-04-02: qty 2

## 2016-04-02 MED ORDER — STERILE WATER FOR IRRIGATION IR SOLN
Status: DC | PRN
  Administered 2016-04-02: 13:00:00

## 2016-04-02 MED ORDER — MIDAZOLAM HCL 5 MG/5ML IJ SOLN
INTRAMUSCULAR | Status: DC | PRN
  Administered 2016-04-02: 2 mg via INTRAVENOUS
  Administered 2016-04-02 (×2): 1 mg via INTRAVENOUS

## 2016-04-02 MED ORDER — MEPERIDINE HCL 100 MG/ML IJ SOLN
INTRAMUSCULAR | Status: DC | PRN
  Administered 2016-04-02: 50 mg via INTRAVENOUS

## 2016-04-02 MED ORDER — SODIUM CHLORIDE 0.9 % IV SOLN
INTRAVENOUS | Status: DC
  Administered 2016-04-02: 1000 mL via INTRAVENOUS

## 2016-04-02 MED ORDER — ONDANSETRON HCL 4 MG/2ML IJ SOLN
INTRAMUSCULAR | Status: DC | PRN
  Administered 2016-04-02: 4 mg via INTRAVENOUS

## 2016-04-02 MED ORDER — MEPERIDINE HCL 100 MG/ML IJ SOLN
INTRAMUSCULAR | Status: AC
  Filled 2016-04-02: qty 2

## 2016-04-02 MED ORDER — MIDAZOLAM HCL 5 MG/5ML IJ SOLN
INTRAMUSCULAR | Status: AC
  Filled 2016-04-02: qty 10

## 2016-04-02 NOTE — Interval H&P Note (Signed)
History and Physical Interval Note:  04/02/2016 1:13 PM  Donna Garza  has presented today for surgery, with the diagnosis of Hx of polyps  The various methods of treatment have been discussed with the patient and family. After consideration of risks, benefits and other options for treatment, the patient has consented to  Procedure(s) with comments: COLONOSCOPY (N/A) - 1:15 PM as a surgical intervention .  The patient's history has been reviewed, patient examined, no change in status, stable for surgery.  I have reviewed the patient's chart and labs.  Questions were answered to the patient's satisfaction.     Demitri Kucinski  No change. Surveillance colonoscopy per plan.  The risks, benefits, limitations, alternatives and imponderables have been reviewed with the patient. Questions have been answered. All parties are agreeable.
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
## 2016-04-02 NOTE — Op Note (Signed)
[facility] Patient Name: Donna Garza Procedure Date: 04/02/2016 12:57 PM MRN: [id number] Date of Birth: [birth year] Attending MD: Norvel Richards , MD CSN: [id number] Age: 75 Admit Type: Outpatient Procedure:                Colonoscopy with snare polypectomy Indications:              High risk colon cancer surveillance: Personal                            history of colonic polyps Providers:                Norvel Richards, MD, Hinton Rao, RN, Isabella Stalling, Technician Referring MD:              Medicines:                Midazolam 4 mg IV, Meperidine 50 mg IV, Ondansetron                            4 mg IV Complications:            No immediate complications. Estimated Blood Loss:     Estimated blood loss was minimal. Procedure:                Pre-Anesthesia Assessment:                           - Prior to the procedure, a History and Physical                            was performed, and patient medications and                            allergies were reviewed. The patient's tolerance of                            previous anesthesia was also reviewed. The risks                            and benefits of the procedure and the sedation                            options and risks were discussed with the patient.                            All questions were answered, and informed consent                            was obtained. Prior Anticoagulants: The patient has                            taken no previous anticoagulant or antiplatelet  agents. ASA Grade Assessment: II - A patient with                            mild systemic disease. After reviewing the risks                            and benefits, the patient was deemed in                            satisfactory condition to undergo the procedure.                           After obtaining informed consent, the colonoscope                            was  passed under direct vision. Throughout the                            procedure, the patient's blood pressure, pulse, and                            oxygen saturations were monitored continuously. The                            [id number] scope was introduced through                            the anus and advanced to the the cecum, identified                            by appendiceal orifice and ileocecal valve. The                            ileocecal valve, appendiceal orifice, and rectum                            were photographed. The entire colon was well                            visualized. The colonoscopy was performed without                            difficulty. The patient tolerated the procedure                            well. The quality of the bowel preparation was                            adequate. Scope In: 1:21:27 PM Scope Out: 1:42:53 PM Scope Withdrawal Time: 0 hours 6 minutes 14 seconds  Total Procedure Duration: 0 hours 21 minutes 26 seconds  Findings:      The perianal and digital rectal examinations were normal.      Scattered medium-mouthed diverticula were found in the sigmoid colon.  A 3 mm polyp was found in the transverse colon. The polyp was sessile.       The polyp was removed with a cold snare. Resection and retrieval were       complete. Estimated blood loss was minimal.      The exam was otherwise without abnormality on direct and retroflexion       views. Impression:               - Diverticulosis in the sigmoid colon.                           - One 3 mm polyp in the transverse colon, removed                            with a cold snare. Resected and retrieved.                           - The examination was otherwise normal on direct                            and retroflexion views. Moderate Sedation:      Moderate (conscious) sedation was administered by the endoscopy nurse       and supervised by the endoscopist. The following  parameters were       monitored: oxygen saturation, heart rate, blood pressure, respiratory       rate, EKG, adequacy of pulmonary ventilation, and response to care.       Total physician intraservice time was 26 minutes. Recommendation:           - Patient has a contact number available for                            emergencies. The signs and symptoms of potential                            delayed complications were discussed with the                            patient. Return to normal activities tomorrow.                            Written discharge instructions were provided to the                            patient.                           - Advance diet as tolerated.                           - Continue present medications.                           - Repeat colonoscopy date to be determined after                            pending  pathology results are reviewed for                            surveillance based on pathology results.                           - Return to GI clinic after studies are complete. Procedure Code(s):        --- Professional ---                           [phone], Colonoscopy, flexible; with removal of                            tumor(s), polyp(s), or other lesion(s) by snare                            technique                           99152, Moderate sedation services provided by the                            same physician or other qualified health care                            professional performing the diagnostic or                            therapeutic service that the sedation supports,                            requiring the presence of an independent trained                            observer to assist in the monitoring of the                            patient's level of consciousness and physiological                            status; initial 15 minutes of intraservice time,                            patient age 107 years or older                            [phone], Moderate sedation services; each additional                            15 minutes intraservice time Diagnosis Code(s):        --- Professional ---                           Z86.010, Personal history of colonic polyps  D12.3, Benign neoplasm of transverse colon (hepatic                            flexure or splenic flexure)                           K57.30, Diverticulosis of large intestine without                            perforation or abscess without bleeding CPT copyright 2016 American Medical Association. All rights reserved. The codes documented in this report are preliminary and upon coder review may  be revised to meet current compliance requirements. Cristopher Estimable. Rourk, MD Norvel Richards, MD 04/02/2016 1:47:47 PM This report has been signed electronically. Number of Addenda: 0

## 2016-04-02 NOTE — Discharge Instructions (Addendum)
Colon Polyps Polyps are lumps of extra tissue growing inside the body. Polyps can grow in the large intestine (colon). Most colon polyps are noncancerous (benign). However, some colon polyps can become cancerous over time. Polyps that are larger than a pea may be harmful. To be safe, caregivers remove and test all polyps. CAUSES  Polyps form when mutations in the genes cause your cells to grow and divide even though no more tissue is needed. RISK FACTORS There are a number of risk factors that can increase your chances of getting colon polyps. They include:  Being older than 50 years.  Family history of colon polyps or colon cancer.  Long-term colon diseases, such as colitis or Crohn disease.  Being overweight.  Smoking.  Being inactive.  Drinking too much alcohol. SYMPTOMS  Most small polyps do not cause symptoms. If symptoms are present, they may include:  Blood in the stool. The stool may look dark red or black.  Constipation or diarrhea that lasts longer than 1 week. DIAGNOSIS People often do not know they have polyps until their caregiver finds them during a regular checkup. Your caregiver can use 4 tests to check for polyps:  Digital rectal exam. The caregiver wears gloves and feels inside the rectum. This test would find polyps only in the rectum.  Barium enema. The caregiver puts a liquid called barium into your rectum before taking X-rays of your colon. Barium makes your colon look white. Polyps are dark, so they are easy to see in the X-ray pictures.  Sigmoidoscopy. A thin, flexible tube (sigmoidoscope) is placed into your rectum. The sigmoidoscope has a light and tiny camera in it. The caregiver uses the sigmoidoscope to look at the last third of your colon.  Colonoscopy. This test is like sigmoidoscopy, but the caregiver looks at the entire colon. This is the most common method for finding and removing polyps. TREATMENT  Any polyps will be removed during a  sigmoidoscopy or colonoscopy. The polyps are then tested for cancer. PREVENTION  To help lower your risk of getting more colon polyps:  Eat plenty of fruits and vegetables. Avoid eating fatty foods.  Do not smoke.  Avoid drinking alcohol.  Exercise every day.  Lose weight if recommended by your caregiver.  Eat plenty of calcium and folate. Foods that are rich in calcium include milk, cheese, and broccoli. Foods that are rich in folate include chickpeas, kidney beans, and spinach. HOME CARE INSTRUCTIONS Keep all follow-up appointments as directed by your caregiver. You may need periodic exams to check for polyps. SEEK MEDICAL CARE IF: You notice bleeding during a bowel movement.   This information is not intended to replace advice given to you by your health care provider. Make sure you discuss any questions you have with your health care provider.   Document Released: 04/17/2004 Document Revised: 08/12/2014 Document Reviewed: 10/01/2011 Elsevier Interactive Patient Education 2016 [employer]  Colonoscopy Discharge Instructions  Read the instructions outlined below and refer to this sheet in the next few weeks. These discharge instructions provide you with general information on caring for yourself after you leave the hospital. Your doctor may also give you specific instructions. While your treatment has been planned according to the most current medical practices available, unavoidable complications occasionally occur. If you have any problems or questions after discharge, call Dr. Gala Romney at [phone]. ACTIVITY  You may resume your regular activity, but move at a slower pace for the next 24 hours.   Take frequent rest  periods for the next 24 hours.   Walking will help get rid of the air and reduce the bloated feeling in your belly (abdomen).   No driving for 24 hours (because of the medicine (anesthesia) used during the test).    Do not sign any important legal documents or  operate any machinery for 24 hours (because of the anesthesia used during the test).  NUTRITION  Drink plenty of fluids.   You may resume your normal diet as instructed by your doctor.   Begin with a light meal and progress to your normal diet. Heavy or fried foods are harder to digest and may make you feel sick to your stomach (nauseated).   Avoid alcoholic beverages for 24 hours or as instructed.  MEDICATIONS  You may resume your normal medications unless your doctor tells you otherwise.  WHAT YOU CAN EXPECT TODAY  Some feelings of bloating in the abdomen.   Passage of more gas than usual.   Spotting of blood in your stool or on the toilet paper.  IF YOU HAD POLYPS REMOVED DURING THE COLONOSCOPY:  No aspirin products for 7 days or as instructed.   No alcohol for 7 days or as instructed.   Eat a soft diet for the next 24 hours.  FINDING OUT THE RESULTS OF YOUR TEST Not all test results are available during your visit. If your test results are not back during the visit, make an appointment with your caregiver to find out the results. Do not assume everything is normal if you have not heard from your caregiver or the medical facility. It is important for you to follow up on all of your test results.  SEEK IMMEDIATE MEDICAL ATTENTION IF:  You have more than a spotting of blood in your stool.   Your belly is swollen (abdominal distention).   You are nauseated or vomiting.   You have a temperature over 101.   You have abdominal pain or discomfort that is severe or gets worse throughout the day.     Colon polyp and diverticulosis information provided  Further recommendations to follow pending review of pathology report

## 2016-04-03 ENCOUNTER — Other Ambulatory Visit: Payer: Self-pay

## 2016-04-03 DIAGNOSIS — M5137 Other intervertebral disc degeneration, lumbosacral region: Secondary | ICD-10-CM | POA: Diagnosis not present

## 2016-04-03 DIAGNOSIS — M9905 Segmental and somatic dysfunction of pelvic region: Secondary | ICD-10-CM | POA: Diagnosis not present

## 2016-04-04 ENCOUNTER — Encounter (HOSPITAL_COMMUNITY): Payer: Self-pay | Admitting: Internal Medicine

## 2016-04-07 ENCOUNTER — Encounter: Payer: Self-pay | Admitting: Internal Medicine

## 2016-04-24 DIAGNOSIS — L570 Actinic keratosis: Secondary | ICD-10-CM | POA: Diagnosis not present

## 2016-05-13 DIAGNOSIS — K210 Gastro-esophageal reflux disease with esophagitis: Secondary | ICD-10-CM | POA: Diagnosis not present

## 2016-05-13 DIAGNOSIS — E7800 Pure hypercholesterolemia, unspecified: Secondary | ICD-10-CM | POA: Diagnosis not present

## 2016-05-13 DIAGNOSIS — R7301 Impaired fasting glucose: Secondary | ICD-10-CM | POA: Diagnosis not present

## 2016-05-13 DIAGNOSIS — I10 Essential (primary) hypertension: Secondary | ICD-10-CM | POA: Diagnosis not present

## 2016-05-13 DIAGNOSIS — E8881 Metabolic syndrome: Secondary | ICD-10-CM | POA: Diagnosis not present

## 2016-05-13 DIAGNOSIS — E782 Mixed hyperlipidemia: Secondary | ICD-10-CM | POA: Diagnosis not present

## 2016-05-15 DIAGNOSIS — R05 Cough: Secondary | ICD-10-CM | POA: Diagnosis not present

## 2016-05-15 DIAGNOSIS — E782 Mixed hyperlipidemia: Secondary | ICD-10-CM | POA: Diagnosis not present

## 2016-05-15 DIAGNOSIS — K210 Gastro-esophageal reflux disease with esophagitis: Secondary | ICD-10-CM | POA: Diagnosis not present

## 2016-05-15 DIAGNOSIS — R7301 Impaired fasting glucose: Secondary | ICD-10-CM | POA: Diagnosis not present

## 2016-05-15 DIAGNOSIS — Z6827 Body mass index (BMI) 27.0-27.9, adult: Secondary | ICD-10-CM | POA: Diagnosis not present

## 2016-05-15 DIAGNOSIS — N183 Chronic kidney disease, stage 3 (moderate): Secondary | ICD-10-CM | POA: Diagnosis not present

## 2016-05-15 DIAGNOSIS — I10 Essential (primary) hypertension: Secondary | ICD-10-CM | POA: Diagnosis not present

## 2016-05-15 DIAGNOSIS — E8881 Metabolic syndrome: Secondary | ICD-10-CM | POA: Diagnosis not present

## 2016-06-20 ENCOUNTER — Encounter (INDEPENDENT_AMBULATORY_CARE_PROVIDER_SITE_OTHER): Payer: Medicare Other | Admitting: Ophthalmology

## 2016-06-20 DIAGNOSIS — H34831 Tributary (branch) retinal vein occlusion, right eye, with macular edema: Secondary | ICD-10-CM

## 2016-06-20 DIAGNOSIS — H43812 Vitreous degeneration, left eye: Secondary | ICD-10-CM

## 2016-06-20 DIAGNOSIS — H35033 Hypertensive retinopathy, bilateral: Secondary | ICD-10-CM | POA: Diagnosis not present

## 2016-06-20 DIAGNOSIS — H2512 Age-related nuclear cataract, left eye: Secondary | ICD-10-CM | POA: Diagnosis not present

## 2016-06-20 DIAGNOSIS — H35371 Puckering of macula, right eye: Secondary | ICD-10-CM | POA: Diagnosis not present

## 2016-06-20 DIAGNOSIS — I10 Essential (primary) hypertension: Secondary | ICD-10-CM | POA: Diagnosis not present

## 2016-06-20 DIAGNOSIS — D3132 Benign neoplasm of left choroid: Secondary | ICD-10-CM

## 2016-06-20 DIAGNOSIS — D3131 Benign neoplasm of right choroid: Secondary | ICD-10-CM | POA: Diagnosis not present

## 2016-06-24 DIAGNOSIS — H34831 Tributary (branch) retinal vein occlusion, right eye, with macular edema: Secondary | ICD-10-CM | POA: Diagnosis not present

## 2016-06-24 DIAGNOSIS — H35033 Hypertensive retinopathy, bilateral: Secondary | ICD-10-CM | POA: Diagnosis not present

## 2016-06-24 DIAGNOSIS — H40023 Open angle with borderline findings, high risk, bilateral: Secondary | ICD-10-CM | POA: Diagnosis not present

## 2016-06-24 DIAGNOSIS — H35371 Puckering of macula, right eye: Secondary | ICD-10-CM | POA: Diagnosis not present

## 2016-07-16 ENCOUNTER — Other Ambulatory Visit (HOSPITAL_COMMUNITY): Payer: Medicare Other

## 2016-07-16 ENCOUNTER — Encounter (HOSPITAL_COMMUNITY): Payer: Medicare Other

## 2016-07-16 ENCOUNTER — Ambulatory Visit: Payer: Medicare Other | Admitting: Family

## 2016-09-12 ENCOUNTER — Encounter (INDEPENDENT_AMBULATORY_CARE_PROVIDER_SITE_OTHER): Payer: Medicare Other | Admitting: Ophthalmology

## 2016-09-12 DIAGNOSIS — I10 Essential (primary) hypertension: Secondary | ICD-10-CM | POA: Diagnosis not present

## 2016-09-12 DIAGNOSIS — D3131 Benign neoplasm of right choroid: Secondary | ICD-10-CM | POA: Diagnosis not present

## 2016-09-12 DIAGNOSIS — H34831 Tributary (branch) retinal vein occlusion, right eye, with macular edema: Secondary | ICD-10-CM

## 2016-09-12 DIAGNOSIS — H2512 Age-related nuclear cataract, left eye: Secondary | ICD-10-CM

## 2016-09-12 DIAGNOSIS — H35033 Hypertensive retinopathy, bilateral: Secondary | ICD-10-CM

## 2016-09-12 DIAGNOSIS — D3132 Benign neoplasm of left choroid: Secondary | ICD-10-CM

## 2016-09-12 DIAGNOSIS — H43812 Vitreous degeneration, left eye: Secondary | ICD-10-CM | POA: Diagnosis not present

## 2016-09-24 ENCOUNTER — Encounter: Payer: Self-pay | Admitting: Family

## 2016-09-27 ENCOUNTER — Encounter: Payer: Self-pay | Admitting: Family

## 2016-09-27 ENCOUNTER — Ambulatory Visit (INDEPENDENT_AMBULATORY_CARE_PROVIDER_SITE_OTHER): Payer: Medicare Other | Admitting: Family

## 2016-09-27 ENCOUNTER — Ambulatory Visit (INDEPENDENT_AMBULATORY_CARE_PROVIDER_SITE_OTHER)
Admission: RE | Admit: 2016-09-27 | Discharge: 2016-09-27 | Disposition: A | Discharge status: Home / Self Care | Payer: Medicare Other | Source: Ambulatory Visit | Attending: Family | Admitting: Family

## 2016-09-27 ENCOUNTER — Ambulatory Visit (HOSPITAL_COMMUNITY)
Admission: RE | Admit: 2016-09-27 | Discharge: 2016-09-27 | Disposition: A | Discharge status: Home / Self Care | Payer: Medicare Other | Source: Ambulatory Visit | Attending: Family | Admitting: Family

## 2016-09-27 VITALS — BP 200/91 | HR 61 | Temp 97.0°F | Resp 16 | Ht 66.0 in | Wt 171.0 lb

## 2016-09-27 DIAGNOSIS — Z48812 Encounter for surgical aftercare following surgery on the circulatory system: Secondary | ICD-10-CM

## 2016-09-27 DIAGNOSIS — I779 Disorder of arteries and arterioles, unspecified: Secondary | ICD-10-CM | POA: Diagnosis not present

## 2016-09-27 DIAGNOSIS — Z95828 Presence of other vascular implants and grafts: Secondary | ICD-10-CM
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
## 2016-09-27 NOTE — Patient Instructions (Signed)

## 2016-10-01 NOTE — Addendum Note (Signed)
Addended by: Lianne Cure A on: 10/01/2016 04:39 PM   Modules accepted: Orders

## 2016-11-06 DIAGNOSIS — E782 Mixed hyperlipidemia: Secondary | ICD-10-CM | POA: Diagnosis not present

## 2016-11-06 DIAGNOSIS — K210 Gastro-esophageal reflux disease with esophagitis: Secondary | ICD-10-CM | POA: Diagnosis not present

## 2016-11-06 DIAGNOSIS — N183 Chronic kidney disease, stage 3 (moderate): Secondary | ICD-10-CM | POA: Diagnosis not present

## 2016-11-06 DIAGNOSIS — I10 Essential (primary) hypertension: Secondary | ICD-10-CM | POA: Diagnosis not present

## 2016-11-06 DIAGNOSIS — R7301 Impaired fasting glucose: Secondary | ICD-10-CM | POA: Diagnosis not present

## 2016-11-11 DIAGNOSIS — E8881 Metabolic syndrome: Secondary | ICD-10-CM | POA: Diagnosis not present

## 2016-11-11 DIAGNOSIS — R7301 Impaired fasting glucose: Secondary | ICD-10-CM | POA: Diagnosis not present

## 2016-11-11 DIAGNOSIS — Z1389 Encounter for screening for other disorder: Secondary | ICD-10-CM | POA: Diagnosis not present

## 2016-11-11 DIAGNOSIS — Z23 Encounter for immunization: Secondary | ICD-10-CM | POA: Diagnosis not present

## 2016-11-11 DIAGNOSIS — N183 Chronic kidney disease, stage 3 (moderate): Secondary | ICD-10-CM | POA: Diagnosis not present

## 2016-11-11 DIAGNOSIS — E782 Mixed hyperlipidemia: Secondary | ICD-10-CM | POA: Diagnosis not present

## 2016-11-11 DIAGNOSIS — I10 Essential (primary) hypertension: Secondary | ICD-10-CM | POA: Diagnosis not present

## 2016-11-11 DIAGNOSIS — K210 Gastro-esophageal reflux disease with esophagitis: Secondary | ICD-10-CM | POA: Diagnosis not present

## 2016-12-05 ENCOUNTER — Encounter (INDEPENDENT_AMBULATORY_CARE_PROVIDER_SITE_OTHER): Payer: Medicare Other | Admitting: Ophthalmology

## 2016-12-05 DIAGNOSIS — H2512 Age-related nuclear cataract, left eye: Secondary | ICD-10-CM

## 2016-12-05 DIAGNOSIS — D3132 Benign neoplasm of left choroid: Secondary | ICD-10-CM | POA: Diagnosis not present

## 2016-12-05 DIAGNOSIS — H43812 Vitreous degeneration, left eye: Secondary | ICD-10-CM

## 2016-12-05 DIAGNOSIS — H34831 Tributary (branch) retinal vein occlusion, right eye, with macular edema: Secondary | ICD-10-CM

## 2016-12-05 DIAGNOSIS — D3131 Benign neoplasm of right choroid: Secondary | ICD-10-CM | POA: Diagnosis not present

## 2016-12-05 DIAGNOSIS — I10 Essential (primary) hypertension: Secondary | ICD-10-CM

## 2016-12-05 DIAGNOSIS — H35033 Hypertensive retinopathy, bilateral: Secondary | ICD-10-CM | POA: Diagnosis not present

## 2016-12-16 DIAGNOSIS — H40023 Open angle with borderline findings, high risk, bilateral: Secondary | ICD-10-CM | POA: Diagnosis not present

## 2016-12-16 DIAGNOSIS — H16141 Punctate keratitis, right eye: Secondary | ICD-10-CM | POA: Diagnosis not present

## 2017-02-27 ENCOUNTER — Encounter (INDEPENDENT_AMBULATORY_CARE_PROVIDER_SITE_OTHER): Payer: Medicare Other | Admitting: Ophthalmology

## 2017-02-27 DIAGNOSIS — I10 Essential (primary) hypertension: Secondary | ICD-10-CM

## 2017-02-27 DIAGNOSIS — D3132 Benign neoplasm of left choroid: Secondary | ICD-10-CM | POA: Diagnosis not present

## 2017-02-27 DIAGNOSIS — H43813 Vitreous degeneration, bilateral: Secondary | ICD-10-CM

## 2017-02-27 DIAGNOSIS — H34831 Tributary (branch) retinal vein occlusion, right eye, with macular edema: Secondary | ICD-10-CM

## 2017-02-27 DIAGNOSIS — H35033 Hypertensive retinopathy, bilateral: Secondary | ICD-10-CM

## 2017-02-27 DIAGNOSIS — D3131 Benign neoplasm of right choroid: Secondary | ICD-10-CM | POA: Diagnosis not present

## 2017-03-18 DIAGNOSIS — Z6827 Body mass index (BMI) 27.0-27.9, adult: Secondary | ICD-10-CM | POA: Diagnosis not present

## 2017-03-18 DIAGNOSIS — Z0000 Encounter for general adult medical examination without abnormal findings: Secondary | ICD-10-CM | POA: Diagnosis not present

## 2017-03-18 DIAGNOSIS — I10 Essential (primary) hypertension: Secondary | ICD-10-CM | POA: Diagnosis not present

## 2017-03-18 DIAGNOSIS — E782 Mixed hyperlipidemia: Secondary | ICD-10-CM | POA: Diagnosis not present

## 2017-04-15 DIAGNOSIS — R55 Syncope and collapse: Secondary | ICD-10-CM | POA: Diagnosis not present

## 2017-04-15 DIAGNOSIS — Z6826 Body mass index (BMI) 26.0-26.9, adult: Secondary | ICD-10-CM | POA: Diagnosis not present

## 2017-04-15 DIAGNOSIS — S6992XA Unspecified injury of left wrist, hand and finger(s), initial encounter: Secondary | ICD-10-CM | POA: Diagnosis not present

## 2017-04-15 DIAGNOSIS — R531 Weakness: Secondary | ICD-10-CM | POA: Diagnosis not present

## 2017-04-23 DIAGNOSIS — L570 Actinic keratosis: Secondary | ICD-10-CM | POA: Diagnosis not present

## 2017-04-23 DIAGNOSIS — D1800 Hemangioma unspecified site: Secondary | ICD-10-CM | POA: Diagnosis not present

## 2017-04-23 DIAGNOSIS — L821 Other seborrheic keratosis: Secondary | ICD-10-CM | POA: Diagnosis not present

## 2017-05-06 DIAGNOSIS — K210 Gastro-esophageal reflux disease with esophagitis: Secondary | ICD-10-CM | POA: Diagnosis not present

## 2017-05-06 DIAGNOSIS — E8881 Metabolic syndrome: Secondary | ICD-10-CM | POA: Diagnosis not present

## 2017-05-06 DIAGNOSIS — I10 Essential (primary) hypertension: Secondary | ICD-10-CM | POA: Diagnosis not present

## 2017-05-06 DIAGNOSIS — N183 Chronic kidney disease, stage 3 (moderate): Secondary | ICD-10-CM | POA: Diagnosis not present

## 2017-05-06 DIAGNOSIS — E7800 Pure hypercholesterolemia, unspecified: Secondary | ICD-10-CM | POA: Diagnosis not present

## 2017-05-06 DIAGNOSIS — R7301 Impaired fasting glucose: Secondary | ICD-10-CM | POA: Diagnosis not present

## 2017-05-06 DIAGNOSIS — E782 Mixed hyperlipidemia: Secondary | ICD-10-CM | POA: Diagnosis not present

## 2017-05-08 DIAGNOSIS — N183 Chronic kidney disease, stage 3 (moderate): Secondary | ICD-10-CM | POA: Diagnosis not present

## 2017-05-08 DIAGNOSIS — R7301 Impaired fasting glucose: Secondary | ICD-10-CM | POA: Diagnosis not present

## 2017-05-08 DIAGNOSIS — Z23 Encounter for immunization: Secondary | ICD-10-CM | POA: Diagnosis not present

## 2017-05-08 DIAGNOSIS — Z6827 Body mass index (BMI) 27.0-27.9, adult: Secondary | ICD-10-CM | POA: Diagnosis not present

## 2017-05-08 DIAGNOSIS — K210 Gastro-esophageal reflux disease with esophagitis: Secondary | ICD-10-CM | POA: Diagnosis not present

## 2017-05-08 DIAGNOSIS — E782 Mixed hyperlipidemia: Secondary | ICD-10-CM | POA: Diagnosis not present

## 2017-05-08 DIAGNOSIS — I10 Essential (primary) hypertension: Secondary | ICD-10-CM | POA: Diagnosis not present

## 2017-05-08 DIAGNOSIS — E8881 Metabolic syndrome: Secondary | ICD-10-CM | POA: Diagnosis not present

## 2017-05-12 DIAGNOSIS — I6523 Occlusion and stenosis of bilateral carotid arteries: Secondary | ICD-10-CM | POA: Diagnosis not present

## 2017-05-12 DIAGNOSIS — R55 Syncope and collapse: Secondary | ICD-10-CM | POA: Diagnosis not present

## 2017-05-12 DIAGNOSIS — R0989 Other specified symptoms and signs involving the circulatory and respiratory systems: Secondary | ICD-10-CM | POA: Diagnosis not present

## 2017-05-22 ENCOUNTER — Encounter (INDEPENDENT_AMBULATORY_CARE_PROVIDER_SITE_OTHER): Payer: Medicare Other | Admitting: Ophthalmology

## 2017-05-22 DIAGNOSIS — H35033 Hypertensive retinopathy, bilateral: Secondary | ICD-10-CM | POA: Diagnosis not present

## 2017-05-22 DIAGNOSIS — I10 Essential (primary) hypertension: Secondary | ICD-10-CM | POA: Diagnosis not present

## 2017-05-22 DIAGNOSIS — H34831 Tributary (branch) retinal vein occlusion, right eye, with macular edema: Secondary | ICD-10-CM | POA: Diagnosis not present

## 2017-05-22 DIAGNOSIS — D3131 Benign neoplasm of right choroid: Secondary | ICD-10-CM | POA: Diagnosis not present

## 2017-05-22 DIAGNOSIS — H43813 Vitreous degeneration, bilateral: Secondary | ICD-10-CM

## 2017-05-22 DIAGNOSIS — D3132 Benign neoplasm of left choroid: Secondary | ICD-10-CM | POA: Diagnosis not present

## 2017-05-22 DIAGNOSIS — H2512 Age-related nuclear cataract, left eye: Secondary | ICD-10-CM | POA: Diagnosis not present

## 2017-06-11 DIAGNOSIS — Z23 Encounter for immunization: Secondary | ICD-10-CM | POA: Diagnosis not present

## 2017-06-16 DIAGNOSIS — H35033 Hypertensive retinopathy, bilateral: Secondary | ICD-10-CM | POA: Diagnosis not present

## 2017-06-16 DIAGNOSIS — H40023 Open angle with borderline findings, high risk, bilateral: Secondary | ICD-10-CM | POA: Diagnosis not present

## 2017-06-16 DIAGNOSIS — H34831 Tributary (branch) retinal vein occlusion, right eye, with macular edema: Secondary | ICD-10-CM | POA: Diagnosis not present

## 2017-06-16 DIAGNOSIS — H35371 Puckering of macula, right eye: Secondary | ICD-10-CM | POA: Diagnosis not present

## 2017-06-30 DIAGNOSIS — H40021 Open angle with borderline findings, high risk, right eye: Secondary | ICD-10-CM | POA: Diagnosis not present

## 2017-08-04 DIAGNOSIS — H16141 Punctate keratitis, right eye: Secondary | ICD-10-CM | POA: Diagnosis not present

## 2017-08-04 DIAGNOSIS — H40021 Open angle with borderline findings, high risk, right eye: Secondary | ICD-10-CM | POA: Diagnosis not present

## 2017-08-04 DIAGNOSIS — H40051 Ocular hypertension, right eye: Secondary | ICD-10-CM | POA: Diagnosis not present

## 2017-08-14 ENCOUNTER — Encounter (INDEPENDENT_AMBULATORY_CARE_PROVIDER_SITE_OTHER): Payer: Medicare Other | Admitting: Ophthalmology

## 2017-08-14 DIAGNOSIS — I10 Essential (primary) hypertension: Secondary | ICD-10-CM | POA: Diagnosis not present

## 2017-08-14 DIAGNOSIS — D3132 Benign neoplasm of left choroid: Secondary | ICD-10-CM | POA: Diagnosis not present

## 2017-08-14 DIAGNOSIS — H2512 Age-related nuclear cataract, left eye: Secondary | ICD-10-CM | POA: Diagnosis not present

## 2017-08-14 DIAGNOSIS — H35033 Hypertensive retinopathy, bilateral: Secondary | ICD-10-CM | POA: Diagnosis not present

## 2017-08-14 DIAGNOSIS — H34831 Tributary (branch) retinal vein occlusion, right eye, with macular edema: Secondary | ICD-10-CM | POA: Diagnosis not present

## 2017-08-14 DIAGNOSIS — H43813 Vitreous degeneration, bilateral: Secondary | ICD-10-CM | POA: Diagnosis not present

## 2017-08-14 DIAGNOSIS — D3131 Benign neoplasm of right choroid: Secondary | ICD-10-CM

## 2017-09-29 ENCOUNTER — Ambulatory Visit (INDEPENDENT_AMBULATORY_CARE_PROVIDER_SITE_OTHER)
Admission: RE | Admit: 2017-09-29 | Discharge: 2017-09-29 | Disposition: A | Discharge status: Home / Self Care | Payer: Medicare Other | Source: Ambulatory Visit | Attending: Family | Admitting: Family

## 2017-09-29 ENCOUNTER — Other Ambulatory Visit: Payer: Self-pay

## 2017-09-29 ENCOUNTER — Ambulatory Visit (HOSPITAL_COMMUNITY)
Admission: RE | Admit: 2017-09-29 | Discharge: 2017-09-29 | Disposition: A | Discharge status: Home / Self Care | Payer: Medicare Other | Source: Ambulatory Visit | Attending: Family | Admitting: Family

## 2017-09-29 ENCOUNTER — Ambulatory Visit (INDEPENDENT_AMBULATORY_CARE_PROVIDER_SITE_OTHER): Payer: Medicare Other | Admitting: Family

## 2017-09-29 ENCOUNTER — Encounter: Payer: Self-pay | Admitting: Family

## 2017-09-29 VITALS — BP 150/84 | HR 74 | Temp 97.5°F | Resp 14 | Wt 165.1 lb

## 2017-09-29 DIAGNOSIS — Z95828 Presence of other vascular implants and grafts: Secondary | ICD-10-CM | POA: Insufficient documentation

## 2017-09-29 DIAGNOSIS — I779 Disorder of arteries and arterioles, unspecified: Secondary | ICD-10-CM

## 2017-09-29 DIAGNOSIS — R9389 Abnormal findings on diagnostic imaging of other specified body structures: Secondary | ICD-10-CM | POA: Diagnosis not present

## 2017-09-29 DIAGNOSIS — R0989 Other specified symptoms and signs involving the circulatory and respiratory systems: Secondary | ICD-10-CM | POA: Diagnosis present
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
## 2017-09-29 NOTE — Patient Instructions (Signed)

## 2017-10-30 ENCOUNTER — Encounter (INDEPENDENT_AMBULATORY_CARE_PROVIDER_SITE_OTHER): Payer: Medicare Other | Admitting: Ophthalmology

## 2017-10-30 DIAGNOSIS — H34831 Tributary (branch) retinal vein occlusion, right eye, with macular edema: Secondary | ICD-10-CM | POA: Diagnosis not present

## 2017-10-30 DIAGNOSIS — I10 Essential (primary) hypertension: Secondary | ICD-10-CM

## 2017-10-30 DIAGNOSIS — H2512 Age-related nuclear cataract, left eye: Secondary | ICD-10-CM

## 2017-10-30 DIAGNOSIS — D3131 Benign neoplasm of right choroid: Secondary | ICD-10-CM

## 2017-10-30 DIAGNOSIS — H43812 Vitreous degeneration, left eye: Secondary | ICD-10-CM | POA: Diagnosis not present

## 2017-10-30 DIAGNOSIS — H35033 Hypertensive retinopathy, bilateral: Secondary | ICD-10-CM

## 2017-10-30 DIAGNOSIS — D3132 Benign neoplasm of left choroid: Secondary | ICD-10-CM

## 2017-10-31 DIAGNOSIS — E8881 Metabolic syndrome: Secondary | ICD-10-CM | POA: Diagnosis not present

## 2017-10-31 DIAGNOSIS — E782 Mixed hyperlipidemia: Secondary | ICD-10-CM | POA: Diagnosis not present

## 2017-10-31 DIAGNOSIS — E7800 Pure hypercholesterolemia, unspecified: Secondary | ICD-10-CM | POA: Diagnosis not present

## 2017-10-31 DIAGNOSIS — K210 Gastro-esophageal reflux disease with esophagitis: Secondary | ICD-10-CM | POA: Diagnosis not present

## 2017-10-31 DIAGNOSIS — R7301 Impaired fasting glucose: Secondary | ICD-10-CM | POA: Diagnosis not present

## 2017-10-31 DIAGNOSIS — R0989 Other specified symptoms and signs involving the circulatory and respiratory systems: Secondary | ICD-10-CM | POA: Diagnosis not present

## 2017-10-31 DIAGNOSIS — N183 Chronic kidney disease, stage 3 (moderate): Secondary | ICD-10-CM | POA: Diagnosis not present

## 2017-10-31 DIAGNOSIS — I10 Essential (primary) hypertension: Secondary | ICD-10-CM | POA: Diagnosis not present

## 2017-11-04 DIAGNOSIS — Z6827 Body mass index (BMI) 27.0-27.9, adult: Secondary | ICD-10-CM | POA: Diagnosis not present

## 2017-11-04 DIAGNOSIS — E782 Mixed hyperlipidemia: Secondary | ICD-10-CM | POA: Diagnosis not present

## 2017-11-04 DIAGNOSIS — I10 Essential (primary) hypertension: Secondary | ICD-10-CM | POA: Diagnosis not present

## 2017-11-04 DIAGNOSIS — R0989 Other specified symptoms and signs involving the circulatory and respiratory systems: Secondary | ICD-10-CM | POA: Diagnosis not present

## 2017-11-04 DIAGNOSIS — R7301 Impaired fasting glucose: Secondary | ICD-10-CM | POA: Diagnosis not present

## 2017-11-04 DIAGNOSIS — R011 Cardiac murmur, unspecified: Secondary | ICD-10-CM | POA: Diagnosis not present

## 2017-11-04 DIAGNOSIS — N183 Chronic kidney disease, stage 3 (moderate): Secondary | ICD-10-CM | POA: Diagnosis not present

## 2017-11-04 DIAGNOSIS — E8881 Metabolic syndrome: Secondary | ICD-10-CM | POA: Diagnosis not present

## 2017-12-17 DIAGNOSIS — H40021 Open angle with borderline findings, high risk, right eye: Secondary | ICD-10-CM | POA: Diagnosis not present

## 2017-12-17 DIAGNOSIS — H40051 Ocular hypertension, right eye: Secondary | ICD-10-CM | POA: Diagnosis not present

## 2017-12-17 DIAGNOSIS — H40022 Open angle with borderline findings, high risk, left eye: Secondary | ICD-10-CM | POA: Diagnosis not present

## 2017-12-18 DIAGNOSIS — Z6827 Body mass index (BMI) 27.0-27.9, adult: Secondary | ICD-10-CM | POA: Diagnosis not present

## 2017-12-18 DIAGNOSIS — R21 Rash and other nonspecific skin eruption: Secondary | ICD-10-CM | POA: Diagnosis not present

## 2018-01-15 ENCOUNTER — Encounter (INDEPENDENT_AMBULATORY_CARE_PROVIDER_SITE_OTHER): Payer: Medicare Other | Admitting: Ophthalmology

## 2018-01-15 DIAGNOSIS — D3131 Benign neoplasm of right choroid: Secondary | ICD-10-CM | POA: Diagnosis not present

## 2018-01-15 DIAGNOSIS — H34831 Tributary (branch) retinal vein occlusion, right eye, with macular edema: Secondary | ICD-10-CM

## 2018-01-15 DIAGNOSIS — H43812 Vitreous degeneration, left eye: Secondary | ICD-10-CM

## 2018-01-15 DIAGNOSIS — I10 Essential (primary) hypertension: Secondary | ICD-10-CM

## 2018-01-15 DIAGNOSIS — H35371 Puckering of macula, right eye: Secondary | ICD-10-CM | POA: Diagnosis not present

## 2018-01-15 DIAGNOSIS — H35033 Hypertensive retinopathy, bilateral: Secondary | ICD-10-CM | POA: Diagnosis not present

## 2018-01-15 DIAGNOSIS — D3132 Benign neoplasm of left choroid: Secondary | ICD-10-CM | POA: Diagnosis not present

## 2018-03-02 DIAGNOSIS — E7801 Familial hypercholesterolemia: Secondary | ICD-10-CM | POA: Diagnosis not present

## 2018-03-02 DIAGNOSIS — R7301 Impaired fasting glucose: Secondary | ICD-10-CM | POA: Diagnosis not present

## 2018-03-02 DIAGNOSIS — R0989 Other specified symptoms and signs involving the circulatory and respiratory systems: Secondary | ICD-10-CM | POA: Diagnosis not present

## 2018-03-02 DIAGNOSIS — I10 Essential (primary) hypertension: Secondary | ICD-10-CM | POA: Diagnosis not present

## 2018-03-02 DIAGNOSIS — E782 Mixed hyperlipidemia: Secondary | ICD-10-CM | POA: Diagnosis not present

## 2018-03-02 DIAGNOSIS — K210 Gastro-esophageal reflux disease with esophagitis: Secondary | ICD-10-CM | POA: Diagnosis not present

## 2018-03-02 DIAGNOSIS — E8881 Metabolic syndrome: Secondary | ICD-10-CM | POA: Diagnosis not present

## 2018-03-04 DIAGNOSIS — E782 Mixed hyperlipidemia: Secondary | ICD-10-CM | POA: Diagnosis not present

## 2018-03-04 DIAGNOSIS — R011 Cardiac murmur, unspecified: Secondary | ICD-10-CM | POA: Diagnosis not present

## 2018-03-04 DIAGNOSIS — Z6826 Body mass index (BMI) 26.0-26.9, adult: Secondary | ICD-10-CM | POA: Diagnosis not present

## 2018-03-04 DIAGNOSIS — R7301 Impaired fasting glucose: Secondary | ICD-10-CM | POA: Diagnosis not present

## 2018-03-04 DIAGNOSIS — R05 Cough: Secondary | ICD-10-CM | POA: Diagnosis not present

## 2018-03-04 DIAGNOSIS — N183 Chronic kidney disease, stage 3 (moderate): Secondary | ICD-10-CM | POA: Diagnosis not present

## 2018-03-04 DIAGNOSIS — R0989 Other specified symptoms and signs involving the circulatory and respiratory systems: Secondary | ICD-10-CM | POA: Diagnosis not present

## 2018-03-04 DIAGNOSIS — I10 Essential (primary) hypertension: Secondary | ICD-10-CM | POA: Diagnosis not present

## 2018-03-19 DIAGNOSIS — R011 Cardiac murmur, unspecified: Secondary | ICD-10-CM | POA: Diagnosis not present

## 2018-03-19 DIAGNOSIS — R0602 Shortness of breath: Secondary | ICD-10-CM | POA: Diagnosis not present

## 2018-04-16 ENCOUNTER — Encounter (INDEPENDENT_AMBULATORY_CARE_PROVIDER_SITE_OTHER): Payer: Medicare Other | Admitting: Ophthalmology

## 2018-04-16 DIAGNOSIS — I10 Essential (primary) hypertension: Secondary | ICD-10-CM | POA: Diagnosis not present

## 2018-04-16 DIAGNOSIS — D3132 Benign neoplasm of left choroid: Secondary | ICD-10-CM | POA: Diagnosis not present

## 2018-04-16 DIAGNOSIS — H35033 Hypertensive retinopathy, bilateral: Secondary | ICD-10-CM

## 2018-04-16 DIAGNOSIS — H2512 Age-related nuclear cataract, left eye: Secondary | ICD-10-CM | POA: Diagnosis not present

## 2018-04-16 DIAGNOSIS — H34831 Tributary (branch) retinal vein occlusion, right eye, with macular edema: Secondary | ICD-10-CM | POA: Diagnosis not present

## 2018-04-16 DIAGNOSIS — H43812 Vitreous degeneration, left eye: Secondary | ICD-10-CM | POA: Diagnosis not present

## 2018-04-16 DIAGNOSIS — D3131 Benign neoplasm of right choroid: Secondary | ICD-10-CM

## 2018-04-16 DIAGNOSIS — H35371 Puckering of macula, right eye: Secondary | ICD-10-CM

## 2018-04-22 DIAGNOSIS — L821 Other seborrheic keratosis: Secondary | ICD-10-CM | POA: Diagnosis not present

## 2018-04-22 DIAGNOSIS — D485 Neoplasm of uncertain behavior of skin: Secondary | ICD-10-CM | POA: Diagnosis not present

## 2018-04-22 DIAGNOSIS — L80 Vitiligo: Secondary | ICD-10-CM | POA: Diagnosis not present

## 2018-04-22 DIAGNOSIS — L570 Actinic keratosis: Secondary | ICD-10-CM | POA: Diagnosis not present

## 2018-06-23 ENCOUNTER — Other Ambulatory Visit: Payer: Self-pay

## 2018-07-07 DIAGNOSIS — R7301 Impaired fasting glucose: Secondary | ICD-10-CM | POA: Diagnosis not present

## 2018-07-07 DIAGNOSIS — E8881 Metabolic syndrome: Secondary | ICD-10-CM | POA: Diagnosis not present

## 2018-07-07 DIAGNOSIS — K210 Gastro-esophageal reflux disease with esophagitis: Secondary | ICD-10-CM | POA: Diagnosis not present

## 2018-07-07 DIAGNOSIS — E782 Mixed hyperlipidemia: Secondary | ICD-10-CM | POA: Diagnosis not present

## 2018-07-07 DIAGNOSIS — E7801 Familial hypercholesterolemia: Secondary | ICD-10-CM | POA: Diagnosis not present

## 2018-07-07 DIAGNOSIS — E7800 Pure hypercholesterolemia, unspecified: Secondary | ICD-10-CM | POA: Diagnosis not present

## 2018-07-07 DIAGNOSIS — N183 Chronic kidney disease, stage 3 (moderate): Secondary | ICD-10-CM | POA: Diagnosis not present

## 2018-07-07 DIAGNOSIS — I10 Essential (primary) hypertension: Secondary | ICD-10-CM | POA: Diagnosis not present

## 2018-07-10 DIAGNOSIS — R0989 Other specified symptoms and signs involving the circulatory and respiratory systems: Secondary | ICD-10-CM | POA: Diagnosis not present

## 2018-07-10 DIAGNOSIS — R011 Cardiac murmur, unspecified: Secondary | ICD-10-CM | POA: Diagnosis not present

## 2018-07-10 DIAGNOSIS — J069 Acute upper respiratory infection, unspecified: Secondary | ICD-10-CM | POA: Diagnosis not present

## 2018-07-10 DIAGNOSIS — Z6826 Body mass index (BMI) 26.0-26.9, adult: Secondary | ICD-10-CM | POA: Diagnosis not present

## 2018-07-10 DIAGNOSIS — I10 Essential (primary) hypertension: Secondary | ICD-10-CM | POA: Diagnosis not present

## 2018-07-10 DIAGNOSIS — E782 Mixed hyperlipidemia: Secondary | ICD-10-CM | POA: Diagnosis not present

## 2018-07-10 DIAGNOSIS — N183 Chronic kidney disease, stage 3 (moderate): Secondary | ICD-10-CM | POA: Diagnosis not present

## 2018-07-10 DIAGNOSIS — J040 Acute laryngitis: Secondary | ICD-10-CM | POA: Diagnosis not present

## 2018-07-16 ENCOUNTER — Encounter (INDEPENDENT_AMBULATORY_CARE_PROVIDER_SITE_OTHER): Payer: Medicare Other | Admitting: Ophthalmology

## 2018-07-27 DIAGNOSIS — H40051 Ocular hypertension, right eye: Secondary | ICD-10-CM | POA: Diagnosis not present

## 2018-07-27 DIAGNOSIS — H40021 Open angle with borderline findings, high risk, right eye: Secondary | ICD-10-CM | POA: Diagnosis not present

## 2018-07-27 DIAGNOSIS — H2512 Age-related nuclear cataract, left eye: Secondary | ICD-10-CM | POA: Diagnosis not present

## 2018-07-27 DIAGNOSIS — H40022 Open angle with borderline findings, high risk, left eye: Secondary | ICD-10-CM | POA: Diagnosis not present

## 2018-08-07 ENCOUNTER — Encounter (INDEPENDENT_AMBULATORY_CARE_PROVIDER_SITE_OTHER): Payer: Medicare Other | Admitting: Ophthalmology

## 2018-08-07 DIAGNOSIS — D3131 Benign neoplasm of right choroid: Secondary | ICD-10-CM | POA: Diagnosis not present

## 2018-08-07 DIAGNOSIS — H35033 Hypertensive retinopathy, bilateral: Secondary | ICD-10-CM | POA: Diagnosis not present

## 2018-08-07 DIAGNOSIS — H2512 Age-related nuclear cataract, left eye: Secondary | ICD-10-CM

## 2018-08-07 DIAGNOSIS — D3132 Benign neoplasm of left choroid: Secondary | ICD-10-CM

## 2018-08-07 DIAGNOSIS — H34831 Tributary (branch) retinal vein occlusion, right eye, with macular edema: Secondary | ICD-10-CM | POA: Diagnosis not present

## 2018-08-07 DIAGNOSIS — I10 Essential (primary) hypertension: Secondary | ICD-10-CM

## 2018-08-07 DIAGNOSIS — H43813 Vitreous degeneration, bilateral: Secondary | ICD-10-CM

## 2018-11-02 ENCOUNTER — Ambulatory Visit: Payer: Medicare Other | Admitting: Family

## 2018-11-02 ENCOUNTER — Encounter (HOSPITAL_COMMUNITY): Payer: Medicare Other

## 2018-11-03 DIAGNOSIS — I10 Essential (primary) hypertension: Secondary | ICD-10-CM | POA: Diagnosis not present

## 2018-11-03 DIAGNOSIS — E8881 Metabolic syndrome: Secondary | ICD-10-CM | POA: Diagnosis not present

## 2018-11-03 DIAGNOSIS — E7800 Pure hypercholesterolemia, unspecified: Secondary | ICD-10-CM | POA: Diagnosis not present

## 2018-11-03 DIAGNOSIS — K210 Gastro-esophageal reflux disease with esophagitis: Secondary | ICD-10-CM | POA: Diagnosis not present

## 2018-11-03 DIAGNOSIS — R739 Hyperglycemia, unspecified: Secondary | ICD-10-CM | POA: Diagnosis not present

## 2018-11-03 DIAGNOSIS — E782 Mixed hyperlipidemia: Secondary | ICD-10-CM | POA: Diagnosis not present

## 2018-11-04 DIAGNOSIS — I10 Essential (primary) hypertension: Secondary | ICD-10-CM | POA: Diagnosis not present

## 2018-11-04 DIAGNOSIS — R05 Cough: Secondary | ICD-10-CM | POA: Diagnosis not present

## 2018-11-04 DIAGNOSIS — R7301 Impaired fasting glucose: Secondary | ICD-10-CM | POA: Diagnosis not present

## 2018-11-04 DIAGNOSIS — E8881 Metabolic syndrome: Secondary | ICD-10-CM | POA: Diagnosis not present

## 2018-11-04 DIAGNOSIS — N183 Chronic kidney disease, stage 3 (moderate): Secondary | ICD-10-CM | POA: Diagnosis not present

## 2018-11-04 DIAGNOSIS — E782 Mixed hyperlipidemia: Secondary | ICD-10-CM | POA: Diagnosis not present

## 2018-11-04 DIAGNOSIS — R0989 Other specified symptoms and signs involving the circulatory and respiratory systems: Secondary | ICD-10-CM | POA: Diagnosis not present

## 2018-11-04 DIAGNOSIS — R011 Cardiac murmur, unspecified: Secondary | ICD-10-CM | POA: Diagnosis not present

## 2018-11-06 ENCOUNTER — Encounter (INDEPENDENT_AMBULATORY_CARE_PROVIDER_SITE_OTHER): Payer: Medicare Other | Admitting: Ophthalmology

## 2018-11-06 ENCOUNTER — Other Ambulatory Visit: Payer: Self-pay

## 2018-11-06 DIAGNOSIS — I10 Essential (primary) hypertension: Secondary | ICD-10-CM | POA: Diagnosis not present

## 2018-11-06 DIAGNOSIS — D3131 Benign neoplasm of right choroid: Secondary | ICD-10-CM | POA: Diagnosis not present

## 2018-11-06 DIAGNOSIS — H35033 Hypertensive retinopathy, bilateral: Secondary | ICD-10-CM | POA: Diagnosis not present

## 2018-11-06 DIAGNOSIS — H34831 Tributary (branch) retinal vein occlusion, right eye, with macular edema: Secondary | ICD-10-CM

## 2018-11-06 DIAGNOSIS — D3132 Benign neoplasm of left choroid: Secondary | ICD-10-CM | POA: Diagnosis not present

## 2018-11-06 DIAGNOSIS — H43812 Vitreous degeneration, left eye: Secondary | ICD-10-CM

## 2018-12-21 DIAGNOSIS — H16141 Punctate keratitis, right eye: Secondary | ICD-10-CM | POA: Diagnosis not present

## 2018-12-21 DIAGNOSIS — H26491 Other secondary cataract, right eye: Secondary | ICD-10-CM | POA: Diagnosis not present

## 2018-12-21 DIAGNOSIS — H40023 Open angle with borderline findings, high risk, bilateral: Secondary | ICD-10-CM | POA: Diagnosis not present

## 2019-02-05 ENCOUNTER — Encounter (INDEPENDENT_AMBULATORY_CARE_PROVIDER_SITE_OTHER): Payer: Medicare Other | Admitting: Ophthalmology

## 2019-02-05 ENCOUNTER — Other Ambulatory Visit: Payer: Self-pay

## 2019-02-05 DIAGNOSIS — D3132 Benign neoplasm of left choroid: Secondary | ICD-10-CM

## 2019-02-05 DIAGNOSIS — H35033 Hypertensive retinopathy, bilateral: Secondary | ICD-10-CM

## 2019-02-05 DIAGNOSIS — H2512 Age-related nuclear cataract, left eye: Secondary | ICD-10-CM

## 2019-02-05 DIAGNOSIS — H35371 Puckering of macula, right eye: Secondary | ICD-10-CM

## 2019-02-05 DIAGNOSIS — H43812 Vitreous degeneration, left eye: Secondary | ICD-10-CM

## 2019-02-05 DIAGNOSIS — I10 Essential (primary) hypertension: Secondary | ICD-10-CM | POA: Diagnosis not present

## 2019-02-05 DIAGNOSIS — D3131 Benign neoplasm of right choroid: Secondary | ICD-10-CM

## 2019-02-05 DIAGNOSIS — H34831 Tributary (branch) retinal vein occlusion, right eye, with macular edema: Secondary | ICD-10-CM | POA: Diagnosis not present

## 2019-03-03 DIAGNOSIS — H40023 Open angle with borderline findings, high risk, bilateral: Secondary | ICD-10-CM | POA: Diagnosis not present

## 2019-03-03 DIAGNOSIS — Z961 Presence of intraocular lens: Secondary | ICD-10-CM | POA: Diagnosis not present

## 2019-03-03 DIAGNOSIS — H16141 Punctate keratitis, right eye: Secondary | ICD-10-CM | POA: Diagnosis not present

## 2019-03-03 DIAGNOSIS — H26491 Other secondary cataract, right eye: Secondary | ICD-10-CM | POA: Diagnosis not present

## 2019-03-05 DIAGNOSIS — K210 Gastro-esophageal reflux disease with esophagitis: Secondary | ICD-10-CM | POA: Diagnosis not present

## 2019-03-05 DIAGNOSIS — E7800 Pure hypercholesterolemia, unspecified: Secondary | ICD-10-CM | POA: Diagnosis not present

## 2019-03-05 DIAGNOSIS — R7301 Impaired fasting glucose: Secondary | ICD-10-CM | POA: Diagnosis not present

## 2019-03-05 DIAGNOSIS — E782 Mixed hyperlipidemia: Secondary | ICD-10-CM | POA: Diagnosis not present

## 2019-03-09 DIAGNOSIS — R011 Cardiac murmur, unspecified: Secondary | ICD-10-CM | POA: Diagnosis not present

## 2019-03-09 DIAGNOSIS — R0989 Other specified symptoms and signs involving the circulatory and respiratory systems: Secondary | ICD-10-CM | POA: Diagnosis not present

## 2019-03-09 DIAGNOSIS — I10 Essential (primary) hypertension: Secondary | ICD-10-CM | POA: Diagnosis not present

## 2019-03-09 DIAGNOSIS — R7301 Impaired fasting glucose: Secondary | ICD-10-CM | POA: Diagnosis not present

## 2019-03-09 DIAGNOSIS — Z1389 Encounter for screening for other disorder: Secondary | ICD-10-CM | POA: Diagnosis not present

## 2019-03-09 DIAGNOSIS — N183 Chronic kidney disease, stage 3 (moderate): Secondary | ICD-10-CM | POA: Diagnosis not present

## 2019-03-24 ENCOUNTER — Other Ambulatory Visit: Payer: Self-pay

## 2019-03-24 DIAGNOSIS — I779 Disorder of arteries and arterioles, unspecified: Secondary | ICD-10-CM

## 2019-03-29 ENCOUNTER — Telehealth (HOSPITAL_COMMUNITY): Payer: Self-pay | Admitting: Rehabilitation

## 2019-03-29 NOTE — Telephone Encounter (Signed)

## 2019-03-30 ENCOUNTER — Ambulatory Visit (INDEPENDENT_AMBULATORY_CARE_PROVIDER_SITE_OTHER): Payer: Medicare Other | Admitting: Physician Assistant

## 2019-03-30 ENCOUNTER — Ambulatory Visit (HOSPITAL_COMMUNITY)
Admission: RE | Admit: 2019-03-30 | Discharge: 2019-03-30 | Disposition: A | Discharge status: Home / Self Care | Payer: Medicare Other | Source: Ambulatory Visit | Attending: Family | Admitting: Family

## 2019-03-30 ENCOUNTER — Other Ambulatory Visit: Payer: Self-pay

## 2019-03-30 VITALS — BP 137/81 | HR 63 | Temp 98.0°F | Resp 14 | Ht 66.0 in | Wt 163.0 lb

## 2019-03-30 DIAGNOSIS — I739 Peripheral vascular disease, unspecified: Secondary | ICD-10-CM

## 2019-03-30 DIAGNOSIS — I779 Disorder of arteries and arterioles, unspecified: Secondary | ICD-10-CM | POA: Diagnosis not present

## 2019-03-30 NOTE — Progress Notes (Signed)
    Established Previous Bypass   History of Present Illness   Donna Garza is a 78 y.o. (1940/10/24) female who presents to office to go over vascular studies.  She is status post right femoral to below the knee popliteal artery bypass with vein performed by Dr. Kellie Simmering in 2013.  Distal portion of vein graft developed a area of stenosis which was ballooned by Dr. Trula Slade in 2014.  The patient states she has not had any claudication, rest pain, or any tissue changes of her right foot.  Patient states she remembers how she felt and how severe her claudication symptoms were prior to bypass surgery and has not noticed any form of recurrence of the symptoms.  She is taking a baby aspirin as well as a statin daily.  She denies tobacco use.  The patient's PMH, PSH, SH, and FamHx were reviewed and are unchanged from prior visit.  Current Outpatient Medications  Medication Sig Dispense Refill  . aspirin 81 MG tablet Take 81 mg by mouth daily.    [name] atorvastatin (LIPITOR) 20 MG tablet Take 20 mg by mouth daily.    [name] losartan-hydrochlorothiazide (HYZAAR) 50-12.5 MG tablet Take 1 tablet by mouth daily.    [name] omeprazole (PRILOSEC) 20 MG capsule Take 20 mg by mouth daily.      No current facility-administered medications for this visit.     On ROS today: 10 system ROS is negative unless otherwise noted in HPI   Physical Examination   Vitals:   03/30/19 1448  BP: 137/81  Pulse: 63  Resp: 14  Temp: 98 F (36.7 C)  TempSrc: Temporal  SpO2: 98%  Weight: 163 lb (73.9 kg)  Height: 5\' 6"  (1.676 m)   Body mass index is 26.31 kg/m.  General Alert, O x 3, WD, NAD  Pulmonary Sym exp, good B air movt  Cardiac RRR, Nl S1, S2,  Vascular Vessel Right Left  Radial Palpable Palpable  Brachial Not palpable Not palpable  Aorta Not palpable N/A  Femoral Palpable Palpable  Popliteal Not palpable Not palpable  PT Not palpable Not palpable  DP Not palpable Not palpable    Gastro- intestinal soft,  non-distended, non-tender to palpation,   Musculo- skeletal M/S 5/5 throughout  , Extremities without ischemic changes  , No edema present, No visible varicosities , No Lipodermatosclerosis present  Neurologic Pain and light touch intact in extremities , Motor exam as listed above    Non-Invasive Vascular Imaging ABI   R:   ABI: 0.69  PT: Absent  DP: tri  TBI:  0.53  L:   ABI: 0.69  PT: mono  DP: mono  TBI: 0.57    Medical Decision Making   Donna Garza is a 78 y.o. female with PAD who returns to clinic to go over vascular studies; history of right femoral to popliteal bypass with vein in 2013 with subsequent balloon angioplasty of distal bypass in 2014   Despite drop in ABIs of right lower extremity patient denies any return in claudication  Patient was offered graft surveillance duplex due to drop in ABIs however because she is asymptomatic she would like to continue her current schedule with follow-up in 1 year with ABIs  She will call/return office sooner if claudication, rest pain, or tissue changes develop  Continue aspirin and statin daily   Dagoberto Ligas PA-C [facility] [place] Office: [phone]  Clinic MD: Dr. Donnetta Hutching

## 2019-05-14 ENCOUNTER — Encounter (INDEPENDENT_AMBULATORY_CARE_PROVIDER_SITE_OTHER): Payer: Medicare Other | Admitting: Ophthalmology

## 2019-05-14 DIAGNOSIS — H43812 Vitreous degeneration, left eye: Secondary | ICD-10-CM

## 2019-05-14 DIAGNOSIS — I10 Essential (primary) hypertension: Secondary | ICD-10-CM | POA: Diagnosis not present

## 2019-05-14 DIAGNOSIS — H34831 Tributary (branch) retinal vein occlusion, right eye, with macular edema: Secondary | ICD-10-CM

## 2019-05-14 DIAGNOSIS — D3132 Benign neoplasm of left choroid: Secondary | ICD-10-CM

## 2019-05-14 DIAGNOSIS — H35033 Hypertensive retinopathy, bilateral: Secondary | ICD-10-CM

## 2019-05-14 DIAGNOSIS — D3131 Benign neoplasm of right choroid: Secondary | ICD-10-CM

## 2019-05-14 DIAGNOSIS — H2512 Age-related nuclear cataract, left eye: Secondary | ICD-10-CM

## 2019-07-06 DIAGNOSIS — N183 Chronic kidney disease, stage 3 unspecified: Secondary | ICD-10-CM | POA: Diagnosis not present

## 2019-07-06 DIAGNOSIS — R7301 Impaired fasting glucose: Secondary | ICD-10-CM | POA: Diagnosis not present

## 2019-07-06 DIAGNOSIS — K2100 Gastro-esophageal reflux disease with esophagitis, without bleeding: Secondary | ICD-10-CM | POA: Diagnosis not present

## 2019-07-06 DIAGNOSIS — R011 Cardiac murmur, unspecified: Secondary | ICD-10-CM | POA: Diagnosis not present

## 2019-07-06 DIAGNOSIS — E7800 Pure hypercholesterolemia, unspecified: Secondary | ICD-10-CM | POA: Diagnosis not present

## 2019-07-06 DIAGNOSIS — I10 Essential (primary) hypertension: Secondary | ICD-10-CM | POA: Diagnosis not present

## 2019-07-09 DIAGNOSIS — R7301 Impaired fasting glucose: Secondary | ICD-10-CM | POA: Diagnosis not present

## 2019-07-09 DIAGNOSIS — R011 Cardiac murmur, unspecified: Secondary | ICD-10-CM | POA: Diagnosis not present

## 2019-07-09 DIAGNOSIS — R0989 Other specified symptoms and signs involving the circulatory and respiratory systems: Secondary | ICD-10-CM | POA: Diagnosis not present

## 2019-07-09 DIAGNOSIS — I10 Essential (primary) hypertension: Secondary | ICD-10-CM | POA: Diagnosis not present

## 2019-08-09 DIAGNOSIS — H35033 Hypertensive retinopathy, bilateral: Secondary | ICD-10-CM | POA: Diagnosis not present

## 2019-08-09 DIAGNOSIS — H35363 Drusen (degenerative) of macula, bilateral: Secondary | ICD-10-CM | POA: Diagnosis not present

## 2019-08-09 DIAGNOSIS — H34831 Tributary (branch) retinal vein occlusion, right eye, with macular edema: Secondary | ICD-10-CM | POA: Diagnosis not present

## 2019-08-09 DIAGNOSIS — H40023 Open angle with borderline findings, high risk, bilateral: Secondary | ICD-10-CM | POA: Diagnosis not present

## 2019-08-20 ENCOUNTER — Encounter (INDEPENDENT_AMBULATORY_CARE_PROVIDER_SITE_OTHER): Payer: Medicare Other | Admitting: Ophthalmology

## 2019-08-20 ENCOUNTER — Other Ambulatory Visit: Payer: Self-pay

## 2019-08-20 DIAGNOSIS — H2512 Age-related nuclear cataract, left eye: Secondary | ICD-10-CM

## 2019-08-20 DIAGNOSIS — H35033 Hypertensive retinopathy, bilateral: Secondary | ICD-10-CM

## 2019-08-20 DIAGNOSIS — H43813 Vitreous degeneration, bilateral: Secondary | ICD-10-CM

## 2019-08-20 DIAGNOSIS — H34831 Tributary (branch) retinal vein occlusion, right eye, with macular edema: Secondary | ICD-10-CM | POA: Diagnosis not present

## 2019-08-20 DIAGNOSIS — I10 Essential (primary) hypertension: Secondary | ICD-10-CM | POA: Diagnosis not present

## 2019-08-20 DIAGNOSIS — D3131 Benign neoplasm of right choroid: Secondary | ICD-10-CM | POA: Diagnosis not present

## 2019-08-20 DIAGNOSIS — D3132 Benign neoplasm of left choroid: Secondary | ICD-10-CM | POA: Diagnosis not present

## 2019-11-03 DIAGNOSIS — E7800 Pure hypercholesterolemia, unspecified: Secondary | ICD-10-CM | POA: Diagnosis not present

## 2019-11-03 DIAGNOSIS — K2100 Gastro-esophageal reflux disease with esophagitis, without bleeding: Secondary | ICD-10-CM | POA: Diagnosis not present

## 2019-11-03 DIAGNOSIS — E782 Mixed hyperlipidemia: Secondary | ICD-10-CM | POA: Diagnosis not present

## 2019-11-03 DIAGNOSIS — R7301 Impaired fasting glucose: Secondary | ICD-10-CM | POA: Diagnosis not present

## 2019-11-10 DIAGNOSIS — R011 Cardiac murmur, unspecified: Secondary | ICD-10-CM | POA: Diagnosis not present

## 2019-11-10 DIAGNOSIS — R0989 Other specified symptoms and signs involving the circulatory and respiratory systems: Secondary | ICD-10-CM | POA: Diagnosis not present

## 2019-11-10 DIAGNOSIS — E782 Mixed hyperlipidemia: Secondary | ICD-10-CM | POA: Diagnosis not present

## 2019-11-10 DIAGNOSIS — Z0000 Encounter for general adult medical examination without abnormal findings: Secondary | ICD-10-CM | POA: Diagnosis not present

## 2019-11-10 DIAGNOSIS — I10 Essential (primary) hypertension: Secondary | ICD-10-CM | POA: Diagnosis not present

## 2019-11-26 ENCOUNTER — Encounter (INDEPENDENT_AMBULATORY_CARE_PROVIDER_SITE_OTHER): Payer: Medicare Other | Admitting: Ophthalmology

## 2019-11-26 DIAGNOSIS — H43813 Vitreous degeneration, bilateral: Secondary | ICD-10-CM

## 2019-11-26 DIAGNOSIS — H34831 Tributary (branch) retinal vein occlusion, right eye, with macular edema: Secondary | ICD-10-CM | POA: Diagnosis not present

## 2019-11-26 DIAGNOSIS — H35033 Hypertensive retinopathy, bilateral: Secondary | ICD-10-CM | POA: Diagnosis not present

## 2019-11-26 DIAGNOSIS — I10 Essential (primary) hypertension: Secondary | ICD-10-CM

## 2019-11-26 DIAGNOSIS — D3132 Benign neoplasm of left choroid: Secondary | ICD-10-CM

## 2019-11-26 DIAGNOSIS — D3131 Benign neoplasm of right choroid: Secondary | ICD-10-CM | POA: Diagnosis not present

## 2019-12-20 DIAGNOSIS — H00022 Hordeolum internum right lower eyelid: Secondary | ICD-10-CM | POA: Diagnosis not present

## 2019-12-20 DIAGNOSIS — H40023 Open angle with borderline findings, high risk, bilateral: Secondary | ICD-10-CM | POA: Diagnosis not present

## 2020-03-09 DIAGNOSIS — N183 Chronic kidney disease, stage 3 unspecified: Secondary | ICD-10-CM | POA: Diagnosis not present

## 2020-03-09 DIAGNOSIS — I10 Essential (primary) hypertension: Secondary | ICD-10-CM | POA: Diagnosis not present

## 2020-03-09 DIAGNOSIS — K2100 Gastro-esophageal reflux disease with esophagitis, without bleeding: Secondary | ICD-10-CM | POA: Diagnosis not present

## 2020-03-09 DIAGNOSIS — R7301 Impaired fasting glucose: Secondary | ICD-10-CM | POA: Diagnosis not present

## 2020-03-13 DIAGNOSIS — I10 Essential (primary) hypertension: Secondary | ICD-10-CM | POA: Diagnosis not present

## 2020-03-13 DIAGNOSIS — E782 Mixed hyperlipidemia: Secondary | ICD-10-CM | POA: Diagnosis not present

## 2020-03-13 DIAGNOSIS — E1151 Type 2 diabetes mellitus with diabetic peripheral angiopathy without gangrene: Secondary | ICD-10-CM | POA: Diagnosis not present

## 2020-03-13 DIAGNOSIS — E876 Hypokalemia: Secondary | ICD-10-CM | POA: Diagnosis not present

## 2020-03-17 ENCOUNTER — Other Ambulatory Visit: Payer: Self-pay

## 2020-03-17 ENCOUNTER — Encounter (INDEPENDENT_AMBULATORY_CARE_PROVIDER_SITE_OTHER): Payer: Medicare Other | Admitting: Ophthalmology

## 2020-03-17 DIAGNOSIS — I10 Essential (primary) hypertension: Secondary | ICD-10-CM

## 2020-03-17 DIAGNOSIS — H43813 Vitreous degeneration, bilateral: Secondary | ICD-10-CM

## 2020-03-17 DIAGNOSIS — D3132 Benign neoplasm of left choroid: Secondary | ICD-10-CM

## 2020-03-17 DIAGNOSIS — H35033 Hypertensive retinopathy, bilateral: Secondary | ICD-10-CM

## 2020-03-17 DIAGNOSIS — D3131 Benign neoplasm of right choroid: Secondary | ICD-10-CM | POA: Diagnosis not present

## 2020-03-17 DIAGNOSIS — H34831 Tributary (branch) retinal vein occlusion, right eye, with macular edema: Secondary | ICD-10-CM | POA: Diagnosis not present

## 2020-03-17 DIAGNOSIS — H2512 Age-related nuclear cataract, left eye: Secondary | ICD-10-CM

## 2020-04-03 DIAGNOSIS — H40023 Open angle with borderline findings, high risk, bilateral: Secondary | ICD-10-CM | POA: Diagnosis not present

## 2020-04-03 DIAGNOSIS — H2512 Age-related nuclear cataract, left eye: Secondary | ICD-10-CM | POA: Diagnosis not present

## 2020-04-03 DIAGNOSIS — H25012 Cortical age-related cataract, left eye: Secondary | ICD-10-CM | POA: Diagnosis not present

## 2020-04-03 DIAGNOSIS — H35033 Hypertensive retinopathy, bilateral: Secondary | ICD-10-CM | POA: Diagnosis not present

## 2020-04-03 DIAGNOSIS — H35363 Drusen (degenerative) of macula, bilateral: Secondary | ICD-10-CM | POA: Diagnosis not present

## 2020-04-25 DIAGNOSIS — H25812 Combined forms of age-related cataract, left eye: Secondary | ICD-10-CM | POA: Diagnosis not present

## 2020-04-25 DIAGNOSIS — H25012 Cortical age-related cataract, left eye: Secondary | ICD-10-CM | POA: Diagnosis not present

## 2020-04-25 DIAGNOSIS — H2512 Age-related nuclear cataract, left eye: Secondary | ICD-10-CM | POA: Diagnosis not present

## 2020-07-14 DIAGNOSIS — N183 Chronic kidney disease, stage 3 unspecified: Secondary | ICD-10-CM | POA: Diagnosis not present

## 2020-07-14 DIAGNOSIS — I10 Essential (primary) hypertension: Secondary | ICD-10-CM | POA: Diagnosis not present

## 2020-07-14 DIAGNOSIS — E876 Hypokalemia: Secondary | ICD-10-CM | POA: Diagnosis not present

## 2020-07-14 DIAGNOSIS — K2100 Gastro-esophageal reflux disease with esophagitis, without bleeding: Secondary | ICD-10-CM | POA: Diagnosis not present

## 2020-07-14 DIAGNOSIS — R7301 Impaired fasting glucose: Secondary | ICD-10-CM | POA: Diagnosis not present

## 2020-07-18 DIAGNOSIS — E1151 Type 2 diabetes mellitus with diabetic peripheral angiopathy without gangrene: Secondary | ICD-10-CM | POA: Diagnosis not present

## 2020-07-18 DIAGNOSIS — I10 Essential (primary) hypertension: Secondary | ICD-10-CM | POA: Diagnosis not present

## 2020-07-18 DIAGNOSIS — R011 Cardiac murmur, unspecified: Secondary | ICD-10-CM | POA: Diagnosis not present

## 2020-07-18 DIAGNOSIS — E782 Mixed hyperlipidemia: Secondary | ICD-10-CM | POA: Diagnosis not present

## 2020-07-21 ENCOUNTER — Other Ambulatory Visit: Payer: Self-pay

## 2020-07-21 ENCOUNTER — Encounter (INDEPENDENT_AMBULATORY_CARE_PROVIDER_SITE_OTHER): Payer: Medicare Other | Admitting: Ophthalmology

## 2020-07-21 DIAGNOSIS — D3131 Benign neoplasm of right choroid: Secondary | ICD-10-CM

## 2020-07-21 DIAGNOSIS — I10 Essential (primary) hypertension: Secondary | ICD-10-CM | POA: Diagnosis not present

## 2020-07-21 DIAGNOSIS — D3132 Benign neoplasm of left choroid: Secondary | ICD-10-CM

## 2020-07-21 DIAGNOSIS — H35033 Hypertensive retinopathy, bilateral: Secondary | ICD-10-CM

## 2020-07-21 DIAGNOSIS — H35371 Puckering of macula, right eye: Secondary | ICD-10-CM

## 2020-07-21 DIAGNOSIS — H34831 Tributary (branch) retinal vein occlusion, right eye, with macular edema: Secondary | ICD-10-CM | POA: Diagnosis not present

## 2020-07-21 DIAGNOSIS — H43812 Vitreous degeneration, left eye: Secondary | ICD-10-CM

## 2020-09-27 DIAGNOSIS — J069 Acute upper respiratory infection, unspecified: Secondary | ICD-10-CM | POA: Diagnosis not present

## 2020-09-27 DIAGNOSIS — R059 Cough, unspecified: Secondary | ICD-10-CM | POA: Diagnosis not present

## 2020-11-08 DIAGNOSIS — K2100 Gastro-esophageal reflux disease with esophagitis, without bleeding: Secondary | ICD-10-CM | POA: Diagnosis not present

## 2020-11-08 DIAGNOSIS — E7800 Pure hypercholesterolemia, unspecified: Secondary | ICD-10-CM | POA: Diagnosis not present

## 2020-11-08 DIAGNOSIS — E7849 Other hyperlipidemia: Secondary | ICD-10-CM | POA: Diagnosis not present

## 2020-11-08 DIAGNOSIS — I10 Essential (primary) hypertension: Secondary | ICD-10-CM | POA: Diagnosis not present

## 2020-11-08 DIAGNOSIS — R7301 Impaired fasting glucose: Secondary | ICD-10-CM | POA: Diagnosis not present

## 2020-11-08 DIAGNOSIS — E1122 Type 2 diabetes mellitus with diabetic chronic kidney disease: Secondary | ICD-10-CM | POA: Diagnosis not present

## 2020-11-08 DIAGNOSIS — E782 Mixed hyperlipidemia: Secondary | ICD-10-CM | POA: Diagnosis not present

## 2020-11-10 ENCOUNTER — Other Ambulatory Visit: Payer: Self-pay

## 2020-11-10 ENCOUNTER — Encounter (INDEPENDENT_AMBULATORY_CARE_PROVIDER_SITE_OTHER): Payer: Medicare Other | Admitting: Ophthalmology

## 2020-11-10 DIAGNOSIS — H35033 Hypertensive retinopathy, bilateral: Secondary | ICD-10-CM

## 2020-11-10 DIAGNOSIS — H43812 Vitreous degeneration, left eye: Secondary | ICD-10-CM

## 2020-11-10 DIAGNOSIS — I10 Essential (primary) hypertension: Secondary | ICD-10-CM | POA: Diagnosis not present

## 2020-11-10 DIAGNOSIS — D3132 Benign neoplasm of left choroid: Secondary | ICD-10-CM

## 2020-11-10 DIAGNOSIS — H3562 Retinal hemorrhage, left eye: Secondary | ICD-10-CM

## 2020-11-10 DIAGNOSIS — H34831 Tributary (branch) retinal vein occlusion, right eye, with macular edema: Secondary | ICD-10-CM | POA: Diagnosis not present

## 2020-11-10 DIAGNOSIS — D3131 Benign neoplasm of right choroid: Secondary | ICD-10-CM

## 2020-11-14 DIAGNOSIS — E1151 Type 2 diabetes mellitus with diabetic peripheral angiopathy without gangrene: Secondary | ICD-10-CM | POA: Diagnosis not present

## 2020-11-14 DIAGNOSIS — R0989 Other specified symptoms and signs involving the circulatory and respiratory systems: Secondary | ICD-10-CM | POA: Diagnosis not present

## 2020-11-14 DIAGNOSIS — E1122 Type 2 diabetes mellitus with diabetic chronic kidney disease: Secondary | ICD-10-CM | POA: Diagnosis not present

## 2020-11-14 DIAGNOSIS — I10 Essential (primary) hypertension: Secondary | ICD-10-CM | POA: Diagnosis not present

## 2020-11-14 DIAGNOSIS — E7849 Other hyperlipidemia: Secondary | ICD-10-CM | POA: Diagnosis not present

## 2020-11-14 DIAGNOSIS — R011 Cardiac murmur, unspecified: Secondary | ICD-10-CM | POA: Diagnosis not present

## 2020-11-14 DIAGNOSIS — K2100 Gastro-esophageal reflux disease with esophagitis, without bleeding: Secondary | ICD-10-CM | POA: Diagnosis not present

## 2021-02-01 DIAGNOSIS — E1122 Type 2 diabetes mellitus with diabetic chronic kidney disease: Secondary | ICD-10-CM | POA: Diagnosis not present

## 2021-02-01 DIAGNOSIS — N183 Chronic kidney disease, stage 3 unspecified: Secondary | ICD-10-CM | POA: Diagnosis not present

## 2021-02-01 DIAGNOSIS — E782 Mixed hyperlipidemia: Secondary | ICD-10-CM | POA: Diagnosis not present

## 2021-02-01 DIAGNOSIS — K2100 Gastro-esophageal reflux disease with esophagitis, without bleeding: Secondary | ICD-10-CM | POA: Diagnosis not present

## 2021-02-23 ENCOUNTER — Encounter (INDEPENDENT_AMBULATORY_CARE_PROVIDER_SITE_OTHER): Payer: Medicare Other | Admitting: Ophthalmology

## 2021-02-23 ENCOUNTER — Other Ambulatory Visit: Payer: Self-pay

## 2021-02-23 DIAGNOSIS — D3132 Benign neoplasm of left choroid: Secondary | ICD-10-CM

## 2021-02-23 DIAGNOSIS — H34831 Tributary (branch) retinal vein occlusion, right eye, with macular edema: Secondary | ICD-10-CM

## 2021-02-23 DIAGNOSIS — I10 Essential (primary) hypertension: Secondary | ICD-10-CM

## 2021-02-23 DIAGNOSIS — H43813 Vitreous degeneration, bilateral: Secondary | ICD-10-CM

## 2021-02-23 DIAGNOSIS — H35033 Hypertensive retinopathy, bilateral: Secondary | ICD-10-CM | POA: Diagnosis not present

## 2021-02-23 DIAGNOSIS — D3131 Benign neoplasm of right choroid: Secondary | ICD-10-CM | POA: Diagnosis not present

## 2021-03-02 DIAGNOSIS — H35363 Drusen (degenerative) of macula, bilateral: Secondary | ICD-10-CM | POA: Diagnosis not present

## 2021-03-02 DIAGNOSIS — H35033 Hypertensive retinopathy, bilateral: Secondary | ICD-10-CM | POA: Diagnosis not present

## 2021-03-02 DIAGNOSIS — H34831 Tributary (branch) retinal vein occlusion, right eye, with macular edema: Secondary | ICD-10-CM | POA: Diagnosis not present

## 2021-03-02 DIAGNOSIS — H40023 Open angle with borderline findings, high risk, bilateral: Secondary | ICD-10-CM | POA: Diagnosis not present

## 2021-03-02 DIAGNOSIS — H524 Presbyopia: Secondary | ICD-10-CM | POA: Diagnosis not present

## 2021-03-13 ENCOUNTER — Encounter: Payer: Self-pay | Admitting: *Deleted

## 2021-03-28 DIAGNOSIS — N183 Chronic kidney disease, stage 3 unspecified: Secondary | ICD-10-CM | POA: Diagnosis not present

## 2021-03-28 DIAGNOSIS — E782 Mixed hyperlipidemia: Secondary | ICD-10-CM | POA: Diagnosis not present

## 2021-03-28 DIAGNOSIS — E7849 Other hyperlipidemia: Secondary | ICD-10-CM | POA: Diagnosis not present

## 2021-03-28 DIAGNOSIS — E1122 Type 2 diabetes mellitus with diabetic chronic kidney disease: Secondary | ICD-10-CM | POA: Diagnosis not present

## 2021-03-28 DIAGNOSIS — K2100 Gastro-esophageal reflux disease with esophagitis, without bleeding: Secondary | ICD-10-CM | POA: Diagnosis not present

## 2021-03-28 DIAGNOSIS — I10 Essential (primary) hypertension: Secondary | ICD-10-CM | POA: Diagnosis not present

## 2021-03-30 DIAGNOSIS — Z0001 Encounter for general adult medical examination with abnormal findings: Secondary | ICD-10-CM | POA: Diagnosis not present

## 2021-03-30 DIAGNOSIS — K2100 Gastro-esophageal reflux disease with esophagitis, without bleeding: Secondary | ICD-10-CM | POA: Diagnosis not present

## 2021-03-30 DIAGNOSIS — E7849 Other hyperlipidemia: Secondary | ICD-10-CM | POA: Diagnosis not present

## 2021-03-30 DIAGNOSIS — E1122 Type 2 diabetes mellitus with diabetic chronic kidney disease: Secondary | ICD-10-CM | POA: Diagnosis not present

## 2021-03-30 DIAGNOSIS — I739 Peripheral vascular disease, unspecified: Secondary | ICD-10-CM | POA: Diagnosis not present

## 2021-03-30 DIAGNOSIS — E1151 Type 2 diabetes mellitus with diabetic peripheral angiopathy without gangrene: Secondary | ICD-10-CM | POA: Diagnosis not present

## 2021-03-30 DIAGNOSIS — I10 Essential (primary) hypertension: Secondary | ICD-10-CM | POA: Diagnosis not present

## 2021-03-30 DIAGNOSIS — N183 Chronic kidney disease, stage 3 unspecified: Secondary | ICD-10-CM | POA: Diagnosis not present

## 2021-03-30 DIAGNOSIS — E782 Mixed hyperlipidemia: Secondary | ICD-10-CM | POA: Diagnosis not present

## 2021-03-30 DIAGNOSIS — R0989 Other specified symptoms and signs involving the circulatory and respiratory systems: Secondary | ICD-10-CM | POA: Diagnosis not present

## 2021-03-30 DIAGNOSIS — R011 Cardiac murmur, unspecified: Secondary | ICD-10-CM | POA: Diagnosis not present

## 2021-04-12 ENCOUNTER — Encounter: Payer: Self-pay | Admitting: Internal Medicine

## 2021-05-04 DIAGNOSIS — N183 Chronic kidney disease, stage 3 unspecified: Secondary | ICD-10-CM | POA: Diagnosis not present

## 2021-05-04 DIAGNOSIS — E782 Mixed hyperlipidemia: Secondary | ICD-10-CM | POA: Diagnosis not present

## 2021-05-04 DIAGNOSIS — K2100 Gastro-esophageal reflux disease with esophagitis, without bleeding: Secondary | ICD-10-CM | POA: Diagnosis not present

## 2021-05-04 DIAGNOSIS — E1122 Type 2 diabetes mellitus with diabetic chronic kidney disease: Secondary | ICD-10-CM | POA: Diagnosis not present

## 2021-06-08 ENCOUNTER — Other Ambulatory Visit: Payer: Self-pay

## 2021-06-08 ENCOUNTER — Encounter (INDEPENDENT_AMBULATORY_CARE_PROVIDER_SITE_OTHER): Payer: Medicare Other | Admitting: Ophthalmology

## 2021-06-08 DIAGNOSIS — I10 Essential (primary) hypertension: Secondary | ICD-10-CM | POA: Diagnosis not present

## 2021-06-08 DIAGNOSIS — H34831 Tributary (branch) retinal vein occlusion, right eye, with macular edema: Secondary | ICD-10-CM

## 2021-06-08 DIAGNOSIS — H43813 Vitreous degeneration, bilateral: Secondary | ICD-10-CM | POA: Diagnosis not present

## 2021-06-08 DIAGNOSIS — H35033 Hypertensive retinopathy, bilateral: Secondary | ICD-10-CM | POA: Diagnosis not present

## 2021-07-04 DIAGNOSIS — N183 Chronic kidney disease, stage 3 unspecified: Secondary | ICD-10-CM | POA: Diagnosis not present

## 2021-07-04 DIAGNOSIS — E1122 Type 2 diabetes mellitus with diabetic chronic kidney disease: Secondary | ICD-10-CM | POA: Diagnosis not present

## 2021-07-04 DIAGNOSIS — E782 Mixed hyperlipidemia: Secondary | ICD-10-CM | POA: Diagnosis not present

## 2021-07-04 DIAGNOSIS — K2100 Gastro-esophageal reflux disease with esophagitis, without bleeding: Secondary | ICD-10-CM | POA: Diagnosis not present

## 2021-07-19 DIAGNOSIS — R7301 Impaired fasting glucose: Secondary | ICD-10-CM | POA: Diagnosis not present

## 2021-07-19 DIAGNOSIS — E782 Mixed hyperlipidemia: Secondary | ICD-10-CM | POA: Diagnosis not present

## 2021-07-19 DIAGNOSIS — E7849 Other hyperlipidemia: Secondary | ICD-10-CM | POA: Diagnosis not present

## 2021-07-19 DIAGNOSIS — E1122 Type 2 diabetes mellitus with diabetic chronic kidney disease: Secondary | ICD-10-CM | POA: Diagnosis not present

## 2021-07-19 DIAGNOSIS — E7800 Pure hypercholesterolemia, unspecified: Secondary | ICD-10-CM | POA: Diagnosis not present

## 2021-07-19 DIAGNOSIS — N183 Chronic kidney disease, stage 3 unspecified: Secondary | ICD-10-CM | POA: Diagnosis not present

## 2021-07-19 DIAGNOSIS — K2100 Gastro-esophageal reflux disease with esophagitis, without bleeding: Secondary | ICD-10-CM | POA: Diagnosis not present

## 2021-07-24 DIAGNOSIS — E7849 Other hyperlipidemia: Secondary | ICD-10-CM | POA: Diagnosis not present

## 2021-07-24 DIAGNOSIS — E1151 Type 2 diabetes mellitus with diabetic peripheral angiopathy without gangrene: Secondary | ICD-10-CM | POA: Diagnosis not present

## 2021-07-24 DIAGNOSIS — E1122 Type 2 diabetes mellitus with diabetic chronic kidney disease: Secondary | ICD-10-CM | POA: Diagnosis not present

## 2021-07-24 DIAGNOSIS — I10 Essential (primary) hypertension: Secondary | ICD-10-CM | POA: Diagnosis not present

## 2021-07-24 DIAGNOSIS — R5383 Other fatigue: Secondary | ICD-10-CM | POA: Diagnosis not present

## 2021-07-24 DIAGNOSIS — R011 Cardiac murmur, unspecified: Secondary | ICD-10-CM | POA: Diagnosis not present

## 2021-08-23 ENCOUNTER — Encounter: Payer: Self-pay | Admitting: Gastroenterology
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
## 2021-08-24 ENCOUNTER — Other Ambulatory Visit: Payer: Self-pay

## 2021-08-24 ENCOUNTER — Encounter: Payer: Self-pay | Admitting: Gastroenterology

## 2021-08-24 ENCOUNTER — Ambulatory Visit: Payer: Medicare Other | Admitting: Gastroenterology

## 2021-08-24 ENCOUNTER — Telehealth: Payer: Self-pay

## 2021-08-24 VITALS — BP 185/76 | HR 64 | Temp 97.2°F | Ht 66.0 in | Wt 163.8 lb

## 2021-08-24 DIAGNOSIS — Z01818 Encounter for other preprocedural examination: Secondary | ICD-10-CM

## 2021-08-24 DIAGNOSIS — Z86010 Personal history of colonic polyps: Secondary | ICD-10-CM

## 2021-08-24 MED ORDER — NA SULFATE-K SULFATE-MG SULF 17.5-3.13-1.6 GM/177ML PO SOLN: 1.0000 | ORAL | 0 refills | Status: DC

## 2021-08-24 NOTE — Patient Instructions (Signed)
We will arrange for you to have a colonoscopy in the near future with Dr. Abbey Chatters.  Keep a close check on your blood sugars while you are on clear liquids.  Correct any low blood sugars with improved sugary clear liquids.  If you resume taking metformin, do not take metformin the morning of your colonoscopy.  It was a pleasure meeting you today!  Aliene Altes, PA-C [facility]

## 2021-08-24 NOTE — Telephone Encounter (Signed)
PA for TCS submitted via [facility] website. PA# [id number], valid 09/11/21-12/10/21.

## 2021-09-10 ENCOUNTER — Telehealth: Payer: Self-pay

## 2021-09-10 ENCOUNTER — Other Ambulatory Visit (HOSPITAL_COMMUNITY)
Admission: RE | Admit: 2021-09-10 | Discharge: 2021-09-10 | Disposition: A | Discharge status: Home / Self Care | Payer: Medicare Other | Source: Ambulatory Visit | Attending: Internal Medicine | Admitting: Internal Medicine

## 2021-09-10 DIAGNOSIS — Z86010 Personal history of colonic polyps: Secondary | ICD-10-CM | POA: Diagnosis not present

## 2021-09-10 LAB — BASIC METABOLIC PANEL
Anion gap: 8 (ref 5–15)
BUN: 16 mg/dL (ref 8–23)
CO2: 27 mmol/L (ref 22–32)
Calcium: 9.3 mg/dL (ref 8.9–10.3)
Chloride: 103 mmol/L (ref 98–111)
Creatinine, Ser: 1.07 mg/dL — ABNORMAL HIGH (ref 0.44–1.00)
GFR, Estimated: 53 mL/min — ABNORMAL LOW (ref >60)
Glucose, Bld: 107 mg/dL — ABNORMAL HIGH (ref 70–99)
Potassium: 3.6 mmol/L (ref 3.5–5.1)
Sodium: 138 mmol/L (ref 135–145)

## 2021-09-10 NOTE — Telephone Encounter (Signed)
Melanie at [facility] said pt hasn't had lab work done for procedure scheduled tomorrow.   Tried to call pt, LMOVM to inform her lab work needs to be done by 12:00pm today if she plans to have procedure tomorrow.

## 2021-09-10 NOTE — Telephone Encounter (Signed)
Lab results in chart.

## 2021-09-11 ENCOUNTER — Encounter (HOSPITAL_COMMUNITY)
Admission: RE | Disposition: A | Discharge status: Home / Self Care | Payer: Self-pay | Source: Home / Self Care | Attending: Internal Medicine

## 2021-09-11 ENCOUNTER — Other Ambulatory Visit: Payer: Self-pay

## 2021-09-11 ENCOUNTER — Ambulatory Visit (HOSPITAL_COMMUNITY): Payer: Medicare Other | Admitting: Anesthesiology

## 2021-09-11 ENCOUNTER — Encounter (HOSPITAL_COMMUNITY): Payer: Self-pay

## 2021-09-11 ENCOUNTER — Ambulatory Visit (HOSPITAL_COMMUNITY)
Admission: RE | Admit: 2021-09-11 | Discharge: 2021-09-11 | Disposition: A | Discharge status: Home / Self Care | Payer: Medicare Other | Attending: Internal Medicine | Admitting: Internal Medicine

## 2021-09-11 DIAGNOSIS — Z86010 Personal history of colonic polyps: Secondary | ICD-10-CM | POA: Insufficient documentation

## 2021-09-11 DIAGNOSIS — K635 Polyp of colon: Secondary | ICD-10-CM

## 2021-09-11 DIAGNOSIS — K648 Other hemorrhoids: Secondary | ICD-10-CM | POA: Diagnosis not present

## 2021-09-11 DIAGNOSIS — N189 Chronic kidney disease, unspecified: Secondary | ICD-10-CM | POA: Diagnosis not present

## 2021-09-11 DIAGNOSIS — E1151 Type 2 diabetes mellitus with diabetic peripheral angiopathy without gangrene: Secondary | ICD-10-CM | POA: Diagnosis not present

## 2021-09-11 DIAGNOSIS — I129 Hypertensive chronic kidney disease with stage 1 through stage 4 chronic kidney disease, or unspecified chronic kidney disease: Secondary | ICD-10-CM | POA: Diagnosis not present

## 2021-09-11 DIAGNOSIS — D124 Benign neoplasm of descending colon: Secondary | ICD-10-CM | POA: Diagnosis not present

## 2021-09-11 DIAGNOSIS — K5730 Diverticulosis of large intestine without perforation or abscess without bleeding: Secondary | ICD-10-CM | POA: Insufficient documentation

## 2021-09-11 DIAGNOSIS — K219 Gastro-esophageal reflux disease without esophagitis: Secondary | ICD-10-CM | POA: Diagnosis not present

## 2021-09-11 DIAGNOSIS — E1122 Type 2 diabetes mellitus with diabetic chronic kidney disease: Secondary | ICD-10-CM | POA: Diagnosis not present

## 2021-09-11 DIAGNOSIS — Z1211 Encounter for screening for malignant neoplasm of colon: Secondary | ICD-10-CM | POA: Diagnosis not present

## 2021-09-11 DIAGNOSIS — Z79899 Other long term (current) drug therapy: Secondary | ICD-10-CM | POA: Diagnosis not present

## 2021-09-11 HISTORY — PX: POLYPECTOMY: SHX5525

## 2021-09-11 HISTORY — PX: COLONOSCOPY WITH PROPOFOL: SHX5780

## 2021-09-11 LAB — GLUCOSE, CAPILLARY: Glucose-Capillary: 127 mg/dL — ABNORMAL HIGH (ref 70–99)

## 2021-09-11 SURGERY — COLONOSCOPY WITH PROPOFOL
Anesthesia: General

## 2021-09-11 MED ORDER — LIDOCAINE HCL (CARDIAC) PF 100 MG/5ML IV SOSY
PREFILLED_SYRINGE | INTRAVENOUS | Status: DC | PRN
  Administered 2021-09-11: 50 mg via INTRAVENOUS

## 2021-09-11 MED ORDER — PROPOFOL 10 MG/ML IV BOLUS
INTRAVENOUS | Status: DC | PRN
  Administered 2021-09-11: 60 mg via INTRAVENOUS
  Administered 2021-09-11: 40 mg via INTRAVENOUS
  Administered 2021-09-11: 100 mg via INTRAVENOUS

## 2021-09-11 MED ORDER — LACTATED RINGERS IV SOLN: INTRAVENOUS | Status: DC | PRN

## 2021-09-11 MED ORDER — LACTATED RINGERS IV SOLN: INTRAVENOUS | Status: DC

## 2021-09-11 NOTE — Interval H&P Note (Signed)
History and Physical Interval Note:  09/11/2021 9:57 AM  Donna Garza  has presented today for surgery, with the diagnosis of history of adenomatous colon polyps.  The various methods of treatment have been discussed with the patient and family. After consideration of risks, benefits and other options for treatment, the patient has consented to  Procedure(s) with comments: COLONOSCOPY WITH PROPOFOL (N/A) - 9:30am as a surgical intervention.  The patient's history has been reviewed, patient examined, no change in status, stable for surgery.  I have reviewed the patient's chart and labs.  Questions were answered to the patient's satisfaction.     Eloise Harman

## 2021-09-11 NOTE — Anesthesia Preprocedure Evaluation (Signed)
Anesthesia Evaluation  Patient identified by MRN, date of birth, ID band Patient awake    Reviewed: Allergy & Precautions, H&P , NPO status , Patient's Chart, lab work & pertinent test results, reviewed documented beta blocker date and time   Airway Mallampati: II  TM Distance: >3 FB Neck ROM: full    Dental no notable dental hx.    Pulmonary neg pulmonary ROS,    Pulmonary exam normal breath sounds clear to auscultation       Cardiovascular Exercise Tolerance: Good hypertension, + Peripheral Vascular Disease   Rhythm:regular Rate:Normal     Neuro/Psych negative neurological ROS  negative psych ROS   GI/Hepatic Neg liver ROS, hiatal hernia, GERD  Medicated,  Endo/Other  negative endocrine ROSdiabetes, Type 2  Renal/GU CRFRenal disease  negative genitourinary   Musculoskeletal   Abdominal   Peds  Hematology negative hematology ROS (+)   Anesthesia Other Findings   Reproductive/Obstetrics negative OB ROS                             Anesthesia Physical Anesthesia Plan  ASA: 2  Anesthesia Plan: General   Post-op Pain Management:    Induction:   PONV Risk Score and Plan: Propofol infusion  Airway Management Planned:   Additional Equipment:   Intra-op Plan:   Post-operative Plan:   Informed Consent: I have reviewed the patients History and Physical, chart, labs and discussed the procedure including the risks, benefits and alternatives for the proposed anesthesia with the patient or authorized representative who has indicated his/her understanding and acceptance.     Dental Advisory Given  Plan Discussed with: CRNA  Anesthesia Plan Comments:         Anesthesia Quick Evaluation

## 2021-09-11 NOTE — Op Note (Signed)
[facility] Patient Name: Donna Garza Procedure Date: 09/11/2021 9:56 AM MRN: [id number] Date of Birth: Dec 12, 1940 Attending MD: Elon Alas. Abbey Chatters DO CSN: [id number] Age: 81 Admit Type: Outpatient Procedure:                Colonoscopy Indications:              Surveillance: Personal history of adenomatous                            polyps on last colonoscopy 5 years ago Providers:                Elon Alas. Abbey Chatters, DO, Janeece Riggers, RN, Hughie Closs RN, RN, Randa Spike, Technician Referring MD:              Medicines:                See the Anesthesia note for documentation of the                            administered medications Complications:            No immediate complications. Estimated Blood Loss:     Estimated blood loss was minimal. Procedure:                Pre-Anesthesia Assessment:                           - The anesthesia plan was to use monitored                            anesthesia care (MAC).                           After obtaining informed consent, the colonoscope                            was passed under direct vision. Throughout the                            procedure, the patient's blood pressure, pulse, and                            oxygen saturations were monitored continuously. The                            [id number]-HQ190L ([id number]) was introduced through the                            anus and advanced to the the cecum, identified by                            appendiceal orifice and ileocecal valve. The                            colonoscopy was  performed without difficulty. The                            patient tolerated the procedure well. The quality                            of the bowel preparation was evaluated using the                            BBPS [facility] Bowel Preparation Scale) with scores                            of: Right Colon = 3, Transverse Colon = 3 and Left                            Colon = 3  (entire mucosa seen well with no residual                            staining, small fragments of stool or opaque                            liquid). The total BBPS score equals 9. Scope In: 10:06:05 AM Scope Out: 10:19:31 AM Scope Withdrawal Time: 0 hours 8 minutes 57 seconds  Total Procedure Duration: 0 hours 13 minutes 26 seconds  Findings:      The perianal and digital rectal examinations were normal.      Non-bleeding internal hemorrhoids were found during endoscopy.      Multiple small-mouthed diverticula were found in the sigmoid colon.      A 4 mm polyp was found in the descending colon. The polyp was sessile.       The polyp was removed with a cold snare. Resection and retrieval were       complete. Impression:               - Non-bleeding internal hemorrhoids.                           - Diverticulosis in the sigmoid colon.                           - One 4 mm polyp in the descending colon, removed                            with a cold snare. Resected and retrieved. Moderate Sedation:      Per Anesthesia Care Recommendation:           - Patient has a contact number available for                            emergencies. The signs and symptoms of potential                            delayed complications were discussed with the  patient. Return to normal activities tomorrow.                            Written discharge instructions were provided to the                            patient.                           - Resume previous diet.                           - Continue present medications.                           - Await pathology results.                           - No repeat colonoscopy due to age.                           - Return to GI clinic PRN. Procedure Code(s):        --- Professional ---                           [id number], Colonoscopy, flexible; with removal of                            tumor(s), polyp(s), or other lesion(s) by snare                             technique Diagnosis Code(s):        --- Professional ---                           Z86.010, Personal history of colonic polyps                           K64.8, Other hemorrhoids                           K63.5, Polyp of colon                           K57.30, Diverticulosis of large intestine without                            perforation or abscess without bleeding CPT copyright 2019 American Medical Association. All rights reserved. The codes documented in this report are preliminary and upon coder review may  be revised to meet current compliance requirements. Elon Alas. Abbey Chatters, DO [place] Abbey Chatters, DO 09/11/2021 10:22:54 AM This report has been signed electronically. Number of Addenda: 0

## 2021-09-11 NOTE — Transfer of Care (Signed)
Immediate Anesthesia Transfer of Care Note  Patient: Donna Garza  Procedure(s) Performed: COLONOSCOPY WITH PROPOFOL POLYPECTOMY  Patient Location: Endoscopy Unit  Anesthesia Type:General  Level of Consciousness: drowsy  Airway & Oxygen Therapy: Patient Spontanous Breathing  Post-op Assessment: Report given to RN and Post -op Vital signs reviewed and stable  Post vital signs: Reviewed and stable  Last Vitals:  Vitals Value Taken Time  BP    Temp    Pulse    Resp    SpO2      Last Pain:  Vitals:   09/11/21 1002  TempSrc:   PainSc: 0-No pain      Patients Stated Pain Goal: 6 (01/74/94 4967)  Complications: No notable events documented.

## 2021-09-11 NOTE — Anesthesia Postprocedure Evaluation (Signed)
Anesthesia Post Note  Patient: Donna Garza  Procedure(s) Performed: COLONOSCOPY WITH PROPOFOL POLYPECTOMY  Patient location during evaluation: Phase II Anesthesia Type: General Level of consciousness: awake Pain management: pain level controlled Vital Signs Assessment: post-procedure vital signs reviewed and stable Respiratory status: spontaneous breathing and respiratory function stable Cardiovascular status: blood pressure returned to baseline and stable Postop Assessment: no headache and no apparent nausea or vomiting Anesthetic complications: no Comments: Late entry   No notable events documented.   Last Vitals:  Vitals:   09/11/21 1022 09/11/21 1026  BP: (!) 95/45 118/66  Pulse: 63   Resp: 14   Temp: 36.4 C   SpO2: 96%     Last Pain:  Vitals:   09/11/21 1026  TempSrc:   PainSc: 0-No pain                 Louann Sjogren

## 2021-09-11 NOTE — Anesthesia Procedure Notes (Signed)
Date/Time: 09/11/2021 10:07 AM Performed by: Orlie Dakin, CRNA Pre-anesthesia Checklist: Patient identified, Emergency Drugs available, Suction available and Patient being monitored Patient Re-evaluated:Patient Re-evaluated prior to induction Oxygen Delivery Method: Nasal cannula Induction Type: IV induction Placement Confirmation: positive ETCO2

## 2021-09-11 NOTE — Discharge Instructions (Addendum)
°  Colonoscopy Discharge Instructions  Read the instructions outlined below and refer to this sheet in the next few weeks. These discharge instructions provide you with general information on caring for yourself after you leave the hospital. Your doctor may also give you specific instructions. While your treatment has been planned according to the most current medical practices available, unavoidable complications occasionally occur.   ACTIVITY You may resume your regular activity, but move at a slower pace for the next 24 hours.  Take frequent rest periods for the next 24 hours.  Walking will help get rid of the air and reduce the bloated feeling in your belly (abdomen).  No driving for 24 hours (because of the medicine (anesthesia) used during the test).   Do not sign any important legal documents or operate any machinery for 24 hours (because of the anesthesia used during the test).  NUTRITION Drink plenty of fluids.  You may resume your normal diet as instructed by your doctor.  Begin with a light meal and progress to your normal diet. Heavy or fried foods are harder to digest and may make you feel sick to your stomach (nauseated).  Avoid alcoholic beverages for 24 hours or as instructed.  MEDICATIONS You may resume your normal medications unless your doctor tells you otherwise.  WHAT YOU CAN EXPECT TODAY Some feelings of bloating in the abdomen.  Passage of more gas than usual.  Spotting of blood in your stool or on the toilet paper.  IF YOU HAD POLYPS REMOVED DURING THE COLONOSCOPY: No aspirin products for 7 days or as instructed.  No alcohol for 7 days or as instructed.  Eat a soft diet for the next 24 hours.  FINDING OUT THE RESULTS OF YOUR TEST Not all test results are available during your visit. If your test results are not back during the visit, make an appointment with your caregiver to find out the results. Do not assume everything is normal if you have not heard from your  caregiver or the medical facility. It is important for you to follow up on all of your test results.  SEEK IMMEDIATE MEDICAL ATTENTION IF: You have more than a spotting of blood in your stool.  Your belly is swollen (abdominal distention).  You are nauseated or vomiting.  You have a temperature over 101.  You have abdominal pain or discomfort that is severe or gets worse throughout the day.   Your colonoscopy revealed 1 polyp(s) which I removed successfully. I think it is reasonable to hold off on any further colonoscopies for polyp surveillance. If you develop GI issues, we will reconsider. Otherwise follow up with GI as needed.    I hope you have a great rest of your week!  Elon Alas. Abbey Chatters, D.O. Gastroenterology and Hepatology [facility]

## 2021-09-12 LAB — SURGICAL PATHOLOGY

## 2021-09-13 ENCOUNTER — Encounter (HOSPITAL_COMMUNITY): Payer: Self-pay | Admitting: Internal Medicine

## 2021-09-14 ENCOUNTER — Encounter (INDEPENDENT_AMBULATORY_CARE_PROVIDER_SITE_OTHER): Payer: Medicare Other | Admitting: Ophthalmology

## 2021-09-14 ENCOUNTER — Other Ambulatory Visit: Payer: Self-pay

## 2021-09-14 DIAGNOSIS — D3132 Benign neoplasm of left choroid: Secondary | ICD-10-CM | POA: Diagnosis not present

## 2021-09-14 DIAGNOSIS — D3131 Benign neoplasm of right choroid: Secondary | ICD-10-CM

## 2021-09-14 DIAGNOSIS — H43812 Vitreous degeneration, left eye: Secondary | ICD-10-CM

## 2021-09-14 DIAGNOSIS — H34831 Tributary (branch) retinal vein occlusion, right eye, with macular edema: Secondary | ICD-10-CM | POA: Diagnosis not present

## 2021-09-14 DIAGNOSIS — I10 Essential (primary) hypertension: Secondary | ICD-10-CM

## 2021-09-14 DIAGNOSIS — H35033 Hypertensive retinopathy, bilateral: Secondary | ICD-10-CM

## 2021-12-05 DIAGNOSIS — K2100 Gastro-esophageal reflux disease with esophagitis, without bleeding: Secondary | ICD-10-CM | POA: Diagnosis not present

## 2021-12-05 DIAGNOSIS — E1151 Type 2 diabetes mellitus with diabetic peripheral angiopathy without gangrene: Secondary | ICD-10-CM | POA: Diagnosis not present

## 2021-12-05 DIAGNOSIS — R7989 Other specified abnormal findings of blood chemistry: Secondary | ICD-10-CM | POA: Diagnosis not present

## 2021-12-05 DIAGNOSIS — E1122 Type 2 diabetes mellitus with diabetic chronic kidney disease: Secondary | ICD-10-CM | POA: Diagnosis not present

## 2021-12-05 DIAGNOSIS — E7800 Pure hypercholesterolemia, unspecified: Secondary | ICD-10-CM | POA: Diagnosis not present

## 2021-12-05 DIAGNOSIS — R7301 Impaired fasting glucose: Secondary | ICD-10-CM | POA: Diagnosis not present

## 2021-12-05 DIAGNOSIS — R011 Cardiac murmur, unspecified: Secondary | ICD-10-CM | POA: Diagnosis not present

## 2021-12-05 DIAGNOSIS — E559 Vitamin D deficiency, unspecified: Secondary | ICD-10-CM | POA: Diagnosis not present

## 2021-12-05 DIAGNOSIS — I10 Essential (primary) hypertension: Secondary | ICD-10-CM | POA: Diagnosis not present

## 2021-12-10 DIAGNOSIS — R0989 Other specified symptoms and signs involving the circulatory and respiratory systems: Secondary | ICD-10-CM | POA: Diagnosis not present

## 2021-12-10 DIAGNOSIS — E1151 Type 2 diabetes mellitus with diabetic peripheral angiopathy without gangrene: Secondary | ICD-10-CM | POA: Diagnosis not present

## 2021-12-10 DIAGNOSIS — R718 Other abnormality of red blood cells: Secondary | ICD-10-CM | POA: Diagnosis not present

## 2021-12-10 DIAGNOSIS — R011 Cardiac murmur, unspecified: Secondary | ICD-10-CM | POA: Diagnosis not present

## 2021-12-10 DIAGNOSIS — E1122 Type 2 diabetes mellitus with diabetic chronic kidney disease: Secondary | ICD-10-CM | POA: Diagnosis not present

## 2021-12-10 DIAGNOSIS — E7849 Other hyperlipidemia: Secondary | ICD-10-CM | POA: Diagnosis not present

## 2021-12-10 DIAGNOSIS — I10 Essential (primary) hypertension: Secondary | ICD-10-CM | POA: Diagnosis not present

## 2021-12-21 ENCOUNTER — Encounter (INDEPENDENT_AMBULATORY_CARE_PROVIDER_SITE_OTHER): Payer: Medicare Other | Admitting: Ophthalmology

## 2021-12-21 DIAGNOSIS — D3132 Benign neoplasm of left choroid: Secondary | ICD-10-CM

## 2021-12-21 DIAGNOSIS — H43812 Vitreous degeneration, left eye: Secondary | ICD-10-CM

## 2021-12-21 DIAGNOSIS — H35033 Hypertensive retinopathy, bilateral: Secondary | ICD-10-CM | POA: Diagnosis not present

## 2021-12-21 DIAGNOSIS — I10 Essential (primary) hypertension: Secondary | ICD-10-CM | POA: Diagnosis not present

## 2021-12-21 DIAGNOSIS — D3131 Benign neoplasm of right choroid: Secondary | ICD-10-CM

## 2021-12-21 DIAGNOSIS — H34831 Tributary (branch) retinal vein occlusion, right eye, with macular edema: Secondary | ICD-10-CM | POA: Diagnosis not present

## 2022-01-02 DIAGNOSIS — L820 Inflamed seborrheic keratosis: Secondary | ICD-10-CM | POA: Diagnosis not present

## 2022-01-02 DIAGNOSIS — B078 Other viral warts: Secondary | ICD-10-CM | POA: Diagnosis not present

## 2022-03-14 DIAGNOSIS — H26492 Other secondary cataract, left eye: Secondary | ICD-10-CM | POA: Diagnosis not present

## 2022-03-14 DIAGNOSIS — H35363 Drusen (degenerative) of macula, bilateral: Secondary | ICD-10-CM | POA: Diagnosis not present

## 2022-03-14 DIAGNOSIS — H524 Presbyopia: Secondary | ICD-10-CM | POA: Diagnosis not present

## 2022-03-14 DIAGNOSIS — H40023 Open angle with borderline findings, high risk, bilateral: Secondary | ICD-10-CM | POA: Diagnosis not present

## 2022-03-14 DIAGNOSIS — H35033 Hypertensive retinopathy, bilateral: Secondary | ICD-10-CM | POA: Diagnosis not present

## 2022-03-29 ENCOUNTER — Encounter (INDEPENDENT_AMBULATORY_CARE_PROVIDER_SITE_OTHER): Payer: Medicare Other | Admitting: Ophthalmology

## 2022-03-29 DIAGNOSIS — I10 Essential (primary) hypertension: Secondary | ICD-10-CM

## 2022-03-29 DIAGNOSIS — D3131 Benign neoplasm of right choroid: Secondary | ICD-10-CM

## 2022-03-29 DIAGNOSIS — D3132 Benign neoplasm of left choroid: Secondary | ICD-10-CM

## 2022-03-29 DIAGNOSIS — H43813 Vitreous degeneration, bilateral: Secondary | ICD-10-CM | POA: Diagnosis not present

## 2022-03-29 DIAGNOSIS — H35033 Hypertensive retinopathy, bilateral: Secondary | ICD-10-CM | POA: Diagnosis not present

## 2022-03-29 DIAGNOSIS — H34831 Tributary (branch) retinal vein occlusion, right eye, with macular edema: Secondary | ICD-10-CM

## 2022-04-09 DIAGNOSIS — E1122 Type 2 diabetes mellitus with diabetic chronic kidney disease: Secondary | ICD-10-CM | POA: Diagnosis not present

## 2022-04-09 DIAGNOSIS — K2100 Gastro-esophageal reflux disease with esophagitis, without bleeding: Secondary | ICD-10-CM | POA: Diagnosis not present

## 2022-04-09 DIAGNOSIS — E7849 Other hyperlipidemia: Secondary | ICD-10-CM | POA: Diagnosis not present

## 2022-04-09 DIAGNOSIS — E1151 Type 2 diabetes mellitus with diabetic peripheral angiopathy without gangrene: Secondary | ICD-10-CM | POA: Diagnosis not present

## 2022-04-09 DIAGNOSIS — I10 Essential (primary) hypertension: Secondary | ICD-10-CM | POA: Diagnosis not present

## 2022-04-09 DIAGNOSIS — R7301 Impaired fasting glucose: Secondary | ICD-10-CM | POA: Diagnosis not present

## 2022-04-12 DIAGNOSIS — I10 Essential (primary) hypertension: Secondary | ICD-10-CM | POA: Diagnosis not present

## 2022-04-12 DIAGNOSIS — R718 Other abnormality of red blood cells: Secondary | ICD-10-CM | POA: Diagnosis not present

## 2022-04-12 DIAGNOSIS — E1122 Type 2 diabetes mellitus with diabetic chronic kidney disease: Secondary | ICD-10-CM | POA: Diagnosis not present

## 2022-04-12 DIAGNOSIS — K2100 Gastro-esophageal reflux disease with esophagitis, without bleeding: Secondary | ICD-10-CM | POA: Diagnosis not present

## 2022-04-12 DIAGNOSIS — R0989 Other specified symptoms and signs involving the circulatory and respiratory systems: Secondary | ICD-10-CM | POA: Diagnosis not present

## 2022-04-12 DIAGNOSIS — E1151 Type 2 diabetes mellitus with diabetic peripheral angiopathy without gangrene: Secondary | ICD-10-CM | POA: Diagnosis not present

## 2022-04-12 DIAGNOSIS — R011 Cardiac murmur, unspecified: Secondary | ICD-10-CM | POA: Diagnosis not present

## 2022-04-12 DIAGNOSIS — I776 Arteritis, unspecified: Secondary | ICD-10-CM | POA: Diagnosis not present

## 2022-04-12 DIAGNOSIS — R5383 Other fatigue: Secondary | ICD-10-CM | POA: Diagnosis not present

## 2022-04-12 DIAGNOSIS — E7849 Other hyperlipidemia: Secondary | ICD-10-CM | POA: Diagnosis not present

## 2022-06-03 ENCOUNTER — Encounter (INDEPENDENT_AMBULATORY_CARE_PROVIDER_SITE_OTHER): Payer: Medicare Other | Admitting: Ophthalmology

## 2022-06-03 DIAGNOSIS — D3132 Benign neoplasm of left choroid: Secondary | ICD-10-CM

## 2022-06-03 DIAGNOSIS — H35033 Hypertensive retinopathy, bilateral: Secondary | ICD-10-CM

## 2022-06-03 DIAGNOSIS — H34831 Tributary (branch) retinal vein occlusion, right eye, with macular edema: Secondary | ICD-10-CM | POA: Diagnosis not present

## 2022-06-03 DIAGNOSIS — I10 Essential (primary) hypertension: Secondary | ICD-10-CM

## 2022-06-03 DIAGNOSIS — H43812 Vitreous degeneration, left eye: Secondary | ICD-10-CM | POA: Diagnosis not present

## 2022-06-03 DIAGNOSIS — D3131 Benign neoplasm of right choroid: Secondary | ICD-10-CM

## 2022-08-09 ENCOUNTER — Encounter (INDEPENDENT_AMBULATORY_CARE_PROVIDER_SITE_OTHER): Payer: Medicare Other | Admitting: Ophthalmology

## 2022-08-09 DIAGNOSIS — R531 Weakness: Secondary | ICD-10-CM | POA: Diagnosis not present

## 2022-08-09 DIAGNOSIS — H35033 Hypertensive retinopathy, bilateral: Secondary | ICD-10-CM

## 2022-08-09 DIAGNOSIS — D3132 Benign neoplasm of left choroid: Secondary | ICD-10-CM | POA: Diagnosis not present

## 2022-08-09 DIAGNOSIS — H35371 Puckering of macula, right eye: Secondary | ICD-10-CM | POA: Diagnosis not present

## 2022-08-09 DIAGNOSIS — E7849 Other hyperlipidemia: Secondary | ICD-10-CM | POA: Diagnosis not present

## 2022-08-09 DIAGNOSIS — I10 Essential (primary) hypertension: Secondary | ICD-10-CM | POA: Diagnosis not present

## 2022-08-09 DIAGNOSIS — H43812 Vitreous degeneration, left eye: Secondary | ICD-10-CM

## 2022-08-09 DIAGNOSIS — D3131 Benign neoplasm of right choroid: Secondary | ICD-10-CM

## 2022-08-09 DIAGNOSIS — R7301 Impaired fasting glucose: Secondary | ICD-10-CM | POA: Diagnosis not present

## 2022-08-09 DIAGNOSIS — H34831 Tributary (branch) retinal vein occlusion, right eye, with macular edema: Secondary | ICD-10-CM | POA: Diagnosis not present

## 2022-08-09 DIAGNOSIS — N183 Chronic kidney disease, stage 3 unspecified: Secondary | ICD-10-CM | POA: Diagnosis not present

## 2022-08-09 DIAGNOSIS — K2100 Gastro-esophageal reflux disease with esophagitis, without bleeding: Secondary | ICD-10-CM | POA: Diagnosis not present

## 2022-08-09 DIAGNOSIS — E1122 Type 2 diabetes mellitus with diabetic chronic kidney disease: Secondary | ICD-10-CM | POA: Diagnosis not present

## 2022-08-13 DIAGNOSIS — E7849 Other hyperlipidemia: Secondary | ICD-10-CM | POA: Diagnosis not present

## 2022-08-13 DIAGNOSIS — R0989 Other specified symptoms and signs involving the circulatory and respiratory systems: Secondary | ICD-10-CM | POA: Diagnosis not present

## 2022-08-13 DIAGNOSIS — E1151 Type 2 diabetes mellitus with diabetic peripheral angiopathy without gangrene: Secondary | ICD-10-CM | POA: Diagnosis not present

## 2022-08-13 DIAGNOSIS — R011 Cardiac murmur, unspecified: Secondary | ICD-10-CM | POA: Diagnosis not present

## 2022-08-13 DIAGNOSIS — R718 Other abnormality of red blood cells: Secondary | ICD-10-CM | POA: Diagnosis not present

## 2022-08-13 DIAGNOSIS — R5383 Other fatigue: Secondary | ICD-10-CM | POA: Diagnosis not present

## 2022-08-13 DIAGNOSIS — K2100 Gastro-esophageal reflux disease with esophagitis, without bleeding: Secondary | ICD-10-CM | POA: Diagnosis not present

## 2022-08-13 DIAGNOSIS — E1122 Type 2 diabetes mellitus with diabetic chronic kidney disease: Secondary | ICD-10-CM | POA: Diagnosis not present

## 2022-08-13 DIAGNOSIS — I776 Arteritis, unspecified: Secondary | ICD-10-CM | POA: Diagnosis not present

## 2022-08-13 DIAGNOSIS — I10 Essential (primary) hypertension: Secondary | ICD-10-CM | POA: Diagnosis not present

## 2022-10-11 ENCOUNTER — Encounter (INDEPENDENT_AMBULATORY_CARE_PROVIDER_SITE_OTHER): Payer: Medicare Other | Admitting: Ophthalmology

## 2022-10-11 DIAGNOSIS — H34811 Central retinal vein occlusion, right eye, with macular edema: Secondary | ICD-10-CM | POA: Diagnosis not present

## 2022-10-11 DIAGNOSIS — H43812 Vitreous degeneration, left eye: Secondary | ICD-10-CM

## 2022-10-11 DIAGNOSIS — D3132 Benign neoplasm of left choroid: Secondary | ICD-10-CM | POA: Diagnosis not present

## 2022-10-11 DIAGNOSIS — D3131 Benign neoplasm of right choroid: Secondary | ICD-10-CM | POA: Diagnosis not present

## 2022-10-11 DIAGNOSIS — I10 Essential (primary) hypertension: Secondary | ICD-10-CM | POA: Diagnosis not present

## 2022-10-11 DIAGNOSIS — H35033 Hypertensive retinopathy, bilateral: Secondary | ICD-10-CM

## 2022-12-05 DIAGNOSIS — E1122 Type 2 diabetes mellitus with diabetic chronic kidney disease: Secondary | ICD-10-CM | POA: Diagnosis not present

## 2022-12-05 DIAGNOSIS — E7800 Pure hypercholesterolemia, unspecified: Secondary | ICD-10-CM | POA: Diagnosis not present

## 2022-12-05 DIAGNOSIS — E1151 Type 2 diabetes mellitus with diabetic peripheral angiopathy without gangrene: Secondary | ICD-10-CM | POA: Diagnosis not present

## 2022-12-05 DIAGNOSIS — K2100 Gastro-esophageal reflux disease with esophagitis, without bleeding: Secondary | ICD-10-CM | POA: Diagnosis not present

## 2022-12-05 DIAGNOSIS — R7301 Impaired fasting glucose: Secondary | ICD-10-CM | POA: Diagnosis not present

## 2022-12-05 DIAGNOSIS — R011 Cardiac murmur, unspecified: Secondary | ICD-10-CM | POA: Diagnosis not present

## 2022-12-05 DIAGNOSIS — E7849 Other hyperlipidemia: Secondary | ICD-10-CM | POA: Diagnosis not present

## 2022-12-05 DIAGNOSIS — E782 Mixed hyperlipidemia: Secondary | ICD-10-CM | POA: Diagnosis not present

## 2022-12-09 DIAGNOSIS — E1122 Type 2 diabetes mellitus with diabetic chronic kidney disease: Secondary | ICD-10-CM | POA: Diagnosis not present

## 2022-12-09 DIAGNOSIS — R718 Other abnormality of red blood cells: Secondary | ICD-10-CM | POA: Diagnosis not present

## 2022-12-09 DIAGNOSIS — R0989 Other specified symptoms and signs involving the circulatory and respiratory systems: Secondary | ICD-10-CM | POA: Diagnosis not present

## 2022-12-09 DIAGNOSIS — E7849 Other hyperlipidemia: Secondary | ICD-10-CM | POA: Diagnosis not present

## 2022-12-09 DIAGNOSIS — R5383 Other fatigue: Secondary | ICD-10-CM | POA: Diagnosis not present

## 2022-12-09 DIAGNOSIS — R011 Cardiac murmur, unspecified: Secondary | ICD-10-CM | POA: Diagnosis not present

## 2022-12-09 DIAGNOSIS — K2100 Gastro-esophageal reflux disease with esophagitis, without bleeding: Secondary | ICD-10-CM | POA: Diagnosis not present

## 2022-12-09 DIAGNOSIS — E1151 Type 2 diabetes mellitus with diabetic peripheral angiopathy without gangrene: Secondary | ICD-10-CM | POA: Diagnosis not present

## 2022-12-09 DIAGNOSIS — Z1389 Encounter for screening for other disorder: Secondary | ICD-10-CM | POA: Diagnosis not present

## 2022-12-09 DIAGNOSIS — Z8639 Personal history of other endocrine, nutritional and metabolic disease: Secondary | ICD-10-CM | POA: Diagnosis not present

## 2022-12-09 DIAGNOSIS — I10 Essential (primary) hypertension: Secondary | ICD-10-CM | POA: Diagnosis not present

## 2022-12-13 ENCOUNTER — Encounter (INDEPENDENT_AMBULATORY_CARE_PROVIDER_SITE_OTHER): Payer: Medicare Other | Admitting: Ophthalmology

## 2022-12-13 DIAGNOSIS — H34831 Tributary (branch) retinal vein occlusion, right eye, with macular edema: Secondary | ICD-10-CM | POA: Diagnosis not present

## 2022-12-13 DIAGNOSIS — H43812 Vitreous degeneration, left eye: Secondary | ICD-10-CM

## 2022-12-13 DIAGNOSIS — D3132 Benign neoplasm of left choroid: Secondary | ICD-10-CM

## 2022-12-13 DIAGNOSIS — I10 Essential (primary) hypertension: Secondary | ICD-10-CM | POA: Diagnosis not present

## 2022-12-13 DIAGNOSIS — D3131 Benign neoplasm of right choroid: Secondary | ICD-10-CM

## 2022-12-13 DIAGNOSIS — H35033 Hypertensive retinopathy, bilateral: Secondary | ICD-10-CM | POA: Diagnosis not present

## 2023-02-21 ENCOUNTER — Encounter (INDEPENDENT_AMBULATORY_CARE_PROVIDER_SITE_OTHER): Payer: Medicare Other | Admitting: Ophthalmology

## 2023-02-21 DIAGNOSIS — D3131 Benign neoplasm of right choroid: Secondary | ICD-10-CM | POA: Diagnosis not present

## 2023-02-21 DIAGNOSIS — H43812 Vitreous degeneration, left eye: Secondary | ICD-10-CM

## 2023-02-21 DIAGNOSIS — H34831 Tributary (branch) retinal vein occlusion, right eye, with macular edema: Secondary | ICD-10-CM | POA: Diagnosis not present

## 2023-02-21 DIAGNOSIS — D3132 Benign neoplasm of left choroid: Secondary | ICD-10-CM

## 2023-02-21 DIAGNOSIS — I10 Essential (primary) hypertension: Secondary | ICD-10-CM | POA: Diagnosis not present

## 2023-02-21 DIAGNOSIS — H35372 Puckering of macula, left eye: Secondary | ICD-10-CM | POA: Diagnosis not present

## 2023-02-21 DIAGNOSIS — H35033 Hypertensive retinopathy, bilateral: Secondary | ICD-10-CM

## 2023-03-05 ENCOUNTER — Telehealth: Payer: Self-pay

## 2023-03-05 DIAGNOSIS — I739 Peripheral vascular disease, unspecified: Secondary | ICD-10-CM

## 2023-03-05 NOTE — Telephone Encounter (Signed)
Pt called stating that she was having the same problems as before. When she walks a long distance, she has pain in her legs.  Reviewed pt's chart, returned call for clarification, no answer, lf vm.  Pt returned call.  Reviewed pt's chart, returned call for clarification, two identifiers used. Pt was last seen in 03/2019, but no recall in system for 1 yr f/u. Pt has had no issues until 3 months ago.  Concern: claudication  Pt denies swelling, discoloration, coldness, wounds, or rest pain  Location: left leg worse than right leg  Description:  x approx 3 mos  Aggravating Factors: walking  Quality: cramping and tight (pulling) that stops when she stops ambulating or with massage  Treatments:  Instructed to keep walking as much as possible, wear compressions  Resolution: Appointment scheduled for non-urgent visit  Next Appt: Appointment scheduled for 04/09/23 for ABI and MD

## 2023-03-06 NOTE — Addendum Note (Signed)
Addended by: Leilani Able, Alpa Salvo A on: 03/06/2023 09:10 AM   Modules accepted: Orders

## 2023-03-18 DIAGNOSIS — H35033 Hypertensive retinopathy, bilateral: Secondary | ICD-10-CM | POA: Diagnosis not present

## 2023-03-18 DIAGNOSIS — H40023 Open angle with borderline findings, high risk, bilateral: Secondary | ICD-10-CM | POA: Diagnosis not present

## 2023-03-18 DIAGNOSIS — H26492 Other secondary cataract, left eye: Secondary | ICD-10-CM | POA: Diagnosis not present

## 2023-03-18 DIAGNOSIS — H34831 Tributary (branch) retinal vein occlusion, right eye, with macular edema: Secondary | ICD-10-CM | POA: Diagnosis not present

## 2023-03-18 DIAGNOSIS — H524 Presbyopia: Secondary | ICD-10-CM | POA: Diagnosis not present

## 2023-03-31 DIAGNOSIS — E1122 Type 2 diabetes mellitus with diabetic chronic kidney disease: Secondary | ICD-10-CM | POA: Diagnosis not present

## 2023-03-31 DIAGNOSIS — R5383 Other fatigue: Secondary | ICD-10-CM | POA: Diagnosis not present

## 2023-03-31 DIAGNOSIS — E1151 Type 2 diabetes mellitus with diabetic peripheral angiopathy without gangrene: Secondary | ICD-10-CM | POA: Diagnosis not present

## 2023-03-31 DIAGNOSIS — R7301 Impaired fasting glucose: Secondary | ICD-10-CM | POA: Diagnosis not present

## 2023-03-31 DIAGNOSIS — E7849 Other hyperlipidemia: Secondary | ICD-10-CM | POA: Diagnosis not present

## 2023-03-31 DIAGNOSIS — R011 Cardiac murmur, unspecified: Secondary | ICD-10-CM | POA: Diagnosis not present

## 2023-03-31 DIAGNOSIS — E782 Mixed hyperlipidemia: Secondary | ICD-10-CM | POA: Diagnosis not present

## 2023-04-09 ENCOUNTER — Ambulatory Visit (HOSPITAL_COMMUNITY)
Admission: RE | Admit: 2023-04-09 | Discharge: 2023-04-09 | Disposition: A | Discharge status: Home / Self Care | Payer: Medicare Other | Source: Ambulatory Visit | Attending: Vascular Surgery | Admitting: Vascular Surgery

## 2023-04-09 ENCOUNTER — Ambulatory Visit: Payer: Medicare Other | Admitting: Vascular Surgery

## 2023-04-09 ENCOUNTER — Encounter: Payer: Self-pay | Admitting: Vascular Surgery

## 2023-04-09 VITALS — BP 143/83 | HR 76 | Temp 97.9°F | Resp 20 | Ht 66.0 in | Wt 162.0 lb

## 2023-04-09 DIAGNOSIS — I739 Peripheral vascular disease, unspecified: Secondary | ICD-10-CM | POA: Diagnosis not present

## 2023-04-09 DIAGNOSIS — I70212 Atherosclerosis of native arteries of extremities with intermittent claudication, left leg: Secondary | ICD-10-CM

## 2023-04-09 LAB — VAS US ABI WITH/WO TBI
Left ABI: 0.59
Right ABI: 0.93
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
## 2023-04-11 DIAGNOSIS — E7849 Other hyperlipidemia: Secondary | ICD-10-CM | POA: Diagnosis not present

## 2023-04-11 DIAGNOSIS — Z8639 Personal history of other endocrine, nutritional and metabolic disease: Secondary | ICD-10-CM | POA: Diagnosis not present

## 2023-04-11 DIAGNOSIS — R718 Other abnormality of red blood cells: Secondary | ICD-10-CM | POA: Diagnosis not present

## 2023-04-11 DIAGNOSIS — E1122 Type 2 diabetes mellitus with diabetic chronic kidney disease: Secondary | ICD-10-CM | POA: Diagnosis not present

## 2023-04-11 DIAGNOSIS — E1151 Type 2 diabetes mellitus with diabetic peripheral angiopathy without gangrene: Secondary | ICD-10-CM | POA: Diagnosis not present

## 2023-04-11 DIAGNOSIS — R5383 Other fatigue: Secondary | ICD-10-CM | POA: Diagnosis not present

## 2023-04-11 DIAGNOSIS — R011 Cardiac murmur, unspecified: Secondary | ICD-10-CM | POA: Diagnosis not present

## 2023-04-11 DIAGNOSIS — R0989 Other specified symptoms and signs involving the circulatory and respiratory systems: Secondary | ICD-10-CM | POA: Diagnosis not present

## 2023-04-11 DIAGNOSIS — K2100 Gastro-esophageal reflux disease with esophagitis, without bleeding: Secondary | ICD-10-CM | POA: Diagnosis not present

## 2023-04-11 DIAGNOSIS — I10 Essential (primary) hypertension: Secondary | ICD-10-CM | POA: Diagnosis not present

## 2023-04-21 DIAGNOSIS — L821 Other seborrheic keratosis: Secondary | ICD-10-CM | POA: Diagnosis not present

## 2023-04-21 DIAGNOSIS — Z85828 Personal history of other malignant neoplasm of skin: Secondary | ICD-10-CM | POA: Diagnosis not present

## 2023-04-21 DIAGNOSIS — L738 Other specified follicular disorders: Secondary | ICD-10-CM | POA: Diagnosis not present

## 2023-04-21 DIAGNOSIS — C44729 Squamous cell carcinoma of skin of left lower limb, including hip: Secondary | ICD-10-CM | POA: Diagnosis not present

## 2023-04-21 DIAGNOSIS — L820 Inflamed seborrheic keratosis: Secondary | ICD-10-CM | POA: Diagnosis not present

## 2023-04-21 DIAGNOSIS — L814 Other melanin hyperpigmentation: Secondary | ICD-10-CM | POA: Diagnosis not present

## 2023-04-21 DIAGNOSIS — L80 Vitiligo: Secondary | ICD-10-CM | POA: Diagnosis not present

## 2023-04-21 DIAGNOSIS — C44722 Squamous cell carcinoma of skin of right lower limb, including hip: Secondary | ICD-10-CM | POA: Diagnosis not present

## 2023-04-21 DIAGNOSIS — D485 Neoplasm of uncertain behavior of skin: Secondary | ICD-10-CM | POA: Diagnosis not present

## 2023-04-22 ENCOUNTER — Other Ambulatory Visit: Payer: Self-pay

## 2023-04-22 DIAGNOSIS — I739 Peripheral vascular disease, unspecified: Secondary | ICD-10-CM

## 2023-04-25 ENCOUNTER — Encounter (INDEPENDENT_AMBULATORY_CARE_PROVIDER_SITE_OTHER): Payer: Medicare Other | Admitting: Ophthalmology

## 2023-04-25 DIAGNOSIS — H43812 Vitreous degeneration, left eye: Secondary | ICD-10-CM | POA: Diagnosis not present

## 2023-04-25 DIAGNOSIS — D3132 Benign neoplasm of left choroid: Secondary | ICD-10-CM

## 2023-04-25 DIAGNOSIS — D3131 Benign neoplasm of right choroid: Secondary | ICD-10-CM | POA: Diagnosis not present

## 2023-04-25 DIAGNOSIS — I10 Essential (primary) hypertension: Secondary | ICD-10-CM | POA: Diagnosis not present

## 2023-04-25 DIAGNOSIS — H35033 Hypertensive retinopathy, bilateral: Secondary | ICD-10-CM

## 2023-04-25 DIAGNOSIS — H34831 Tributary (branch) retinal vein occlusion, right eye, with macular edema: Secondary | ICD-10-CM

## 2023-06-27 ENCOUNTER — Encounter (INDEPENDENT_AMBULATORY_CARE_PROVIDER_SITE_OTHER): Payer: Medicare Other | Admitting: Ophthalmology

## 2023-06-27 DIAGNOSIS — H35373 Puckering of macula, bilateral: Secondary | ICD-10-CM | POA: Diagnosis not present

## 2023-06-27 DIAGNOSIS — D3132 Benign neoplasm of left choroid: Secondary | ICD-10-CM

## 2023-06-27 DIAGNOSIS — D3131 Benign neoplasm of right choroid: Secondary | ICD-10-CM | POA: Diagnosis not present

## 2023-06-27 DIAGNOSIS — H43812 Vitreous degeneration, left eye: Secondary | ICD-10-CM

## 2023-06-27 DIAGNOSIS — H34831 Tributary (branch) retinal vein occlusion, right eye, with macular edema: Secondary | ICD-10-CM | POA: Diagnosis not present

## 2023-06-27 DIAGNOSIS — H35033 Hypertensive retinopathy, bilateral: Secondary | ICD-10-CM | POA: Diagnosis not present

## 2023-06-27 DIAGNOSIS — I10 Essential (primary) hypertension: Secondary | ICD-10-CM

## 2023-07-16 ENCOUNTER — Ambulatory Visit (HOSPITAL_COMMUNITY): Payer: Medicare Other

## 2023-07-16 ENCOUNTER — Ambulatory Visit: Payer: Medicare Other | Admitting: Vascular Surgery

## 2023-07-16 ENCOUNTER — Encounter (HOSPITAL_COMMUNITY): Payer: Medicare Other

## 2023-08-05 DIAGNOSIS — E7849 Other hyperlipidemia: Secondary | ICD-10-CM | POA: Diagnosis not present

## 2023-08-05 DIAGNOSIS — R5383 Other fatigue: Secondary | ICD-10-CM | POA: Diagnosis not present

## 2023-08-05 DIAGNOSIS — N1831 Chronic kidney disease, stage 3a: Secondary | ICD-10-CM | POA: Diagnosis not present

## 2023-08-05 DIAGNOSIS — E1122 Type 2 diabetes mellitus with diabetic chronic kidney disease: Secondary | ICD-10-CM | POA: Diagnosis not present

## 2023-08-05 DIAGNOSIS — E876 Hypokalemia: Secondary | ICD-10-CM | POA: Diagnosis not present

## 2023-08-11 DIAGNOSIS — I10 Essential (primary) hypertension: Secondary | ICD-10-CM | POA: Diagnosis not present

## 2023-08-11 DIAGNOSIS — R011 Cardiac murmur, unspecified: Secondary | ICD-10-CM | POA: Diagnosis not present

## 2023-08-11 DIAGNOSIS — E7849 Other hyperlipidemia: Secondary | ICD-10-CM | POA: Diagnosis not present

## 2023-08-11 DIAGNOSIS — N1831 Chronic kidney disease, stage 3a: Secondary | ICD-10-CM | POA: Diagnosis not present

## 2023-08-11 DIAGNOSIS — R0989 Other specified symptoms and signs involving the circulatory and respiratory systems: Secondary | ICD-10-CM | POA: Diagnosis not present

## 2023-08-11 DIAGNOSIS — E1122 Type 2 diabetes mellitus with diabetic chronic kidney disease: Secondary | ICD-10-CM | POA: Diagnosis not present

## 2023-08-11 DIAGNOSIS — E1151 Type 2 diabetes mellitus with diabetic peripheral angiopathy without gangrene: Secondary | ICD-10-CM | POA: Diagnosis not present

## 2023-08-11 DIAGNOSIS — K219 Gastro-esophageal reflux disease without esophagitis: Secondary | ICD-10-CM | POA: Diagnosis not present

## 2023-08-11 DIAGNOSIS — R5383 Other fatigue: Secondary | ICD-10-CM | POA: Diagnosis not present

## 2023-08-11 DIAGNOSIS — Z8639 Personal history of other endocrine, nutritional and metabolic disease: Secondary | ICD-10-CM | POA: Diagnosis not present

## 2023-08-11 DIAGNOSIS — R718 Other abnormality of red blood cells: Secondary | ICD-10-CM | POA: Diagnosis not present

## 2023-08-22 ENCOUNTER — Encounter (INDEPENDENT_AMBULATORY_CARE_PROVIDER_SITE_OTHER): Payer: Medicare Other | Admitting: Ophthalmology

## 2023-08-22 DIAGNOSIS — H35033 Hypertensive retinopathy, bilateral: Secondary | ICD-10-CM

## 2023-08-22 DIAGNOSIS — H35371 Puckering of macula, right eye: Secondary | ICD-10-CM

## 2023-08-22 DIAGNOSIS — H43812 Vitreous degeneration, left eye: Secondary | ICD-10-CM | POA: Diagnosis not present

## 2023-08-22 DIAGNOSIS — H34831 Tributary (branch) retinal vein occlusion, right eye, with macular edema: Secondary | ICD-10-CM | POA: Diagnosis not present

## 2023-08-22 DIAGNOSIS — D3132 Benign neoplasm of left choroid: Secondary | ICD-10-CM

## 2023-08-22 DIAGNOSIS — I10 Essential (primary) hypertension: Secondary | ICD-10-CM | POA: Diagnosis not present

## 2023-08-22 DIAGNOSIS — D3131 Benign neoplasm of right choroid: Secondary | ICD-10-CM | POA: Diagnosis not present

## 2023-09-19 ENCOUNTER — Ambulatory Visit (INDEPENDENT_AMBULATORY_CARE_PROVIDER_SITE_OTHER)
Admission: RE | Admit: 2023-09-19 | Discharge: 2023-09-19 | Disposition: A | Discharge status: Home / Self Care | Payer: Medicare Other | Source: Ambulatory Visit | Attending: Vascular Surgery | Admitting: Vascular Surgery

## 2023-09-19 ENCOUNTER — Ambulatory Visit (HOSPITAL_COMMUNITY)
Admission: RE | Admit: 2023-09-19 | Discharge: 2023-09-19 | Disposition: A | Discharge status: Home / Self Care | Payer: Medicare Other | Source: Ambulatory Visit | Attending: Vascular Surgery | Admitting: Vascular Surgery

## 2023-09-19 DIAGNOSIS — I739 Peripheral vascular disease, unspecified: Secondary | ICD-10-CM

## 2023-09-19 LAB — VAS US ABI WITH/WO TBI
Left ABI: 0.47
Right ABI: 0.82

## 2023-09-24 ENCOUNTER — Ambulatory Visit: Payer: Medicare Other | Admitting: Vascular Surgery

## 2023-09-24 ENCOUNTER — Encounter: Payer: Self-pay | Admitting: Vascular Surgery

## 2023-09-24 VITALS — BP 157/87 | HR 71 | Temp 97.6°F | Resp 18 | Ht 66.0 in | Wt 167.1 lb

## 2023-09-24 DIAGNOSIS — I70222 Atherosclerosis of native arteries of extremities with rest pain, left leg: Secondary | ICD-10-CM
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
## 2023-09-25 ENCOUNTER — Other Ambulatory Visit: Payer: Self-pay

## 2023-09-25 DIAGNOSIS — I70222 Atherosclerosis of native arteries of extremities with rest pain, left leg: Secondary | ICD-10-CM

## 2023-10-06 ENCOUNTER — Ambulatory Visit (HOSPITAL_COMMUNITY)
Admission: RE | Admit: 2023-10-06 | Discharge: 2023-10-06 | Disposition: A | Discharge status: Home / Self Care | Payer: Medicare Other | Attending: Vascular Surgery | Admitting: Vascular Surgery

## 2023-10-06 ENCOUNTER — Encounter (HOSPITAL_COMMUNITY)
Admission: RE | Disposition: A | Discharge status: Home / Self Care | Payer: Self-pay | Source: Home / Self Care | Attending: Vascular Surgery

## 2023-10-06 ENCOUNTER — Encounter (HOSPITAL_COMMUNITY): Payer: Self-pay | Admitting: Vascular Surgery

## 2023-10-06 DIAGNOSIS — N189 Chronic kidney disease, unspecified: Secondary | ICD-10-CM | POA: Diagnosis not present

## 2023-10-06 DIAGNOSIS — E1122 Type 2 diabetes mellitus with diabetic chronic kidney disease: Secondary | ICD-10-CM | POA: Insufficient documentation

## 2023-10-06 DIAGNOSIS — Z79899 Other long term (current) drug therapy: Secondary | ICD-10-CM | POA: Insufficient documentation

## 2023-10-06 DIAGNOSIS — E1151 Type 2 diabetes mellitus with diabetic peripheral angiopathy without gangrene: Secondary | ICD-10-CM | POA: Diagnosis not present

## 2023-10-06 DIAGNOSIS — I129 Hypertensive chronic kidney disease with stage 1 through stage 4 chronic kidney disease, or unspecified chronic kidney disease: Secondary | ICD-10-CM | POA: Insufficient documentation

## 2023-10-06 DIAGNOSIS — I70222 Atherosclerosis of native arteries of extremities with rest pain, left leg: Secondary | ICD-10-CM | POA: Diagnosis not present

## 2023-10-06 DIAGNOSIS — Z7982 Long term (current) use of aspirin: Secondary | ICD-10-CM | POA: Insufficient documentation

## 2023-10-06 HISTORY — PX: ABDOMINAL AORTOGRAM W/LOWER EXTREMITY: CATH118223

## 2023-10-06 LAB — POCT I-STAT, CHEM 8
BUN: 21 mg/dL (ref 8–23)
Calcium, Ion: 1.28 mmol/L (ref 1.15–1.40)
Chloride: 102 mmol/L (ref 98–111)
Creatinine, Ser: 1.2 mg/dL — ABNORMAL HIGH (ref 0.44–1.00)
Glucose, Bld: 99 mg/dL (ref 70–99)
HCT: 43 % (ref 36.0–46.0)
Hemoglobin: 14.6 g/dL (ref 12.0–15.0)
Potassium: 3.9 mmol/L (ref 3.5–5.1)
Sodium: 140 mmol/L (ref 135–145)
TCO2: 29 mmol/L (ref 22–32)

## 2023-10-06 SURGERY — ABDOMINAL AORTOGRAM W/LOWER EXTREMITY
Anesthesia: LOCAL | Laterality: Bilateral

## 2023-10-06 MED ORDER — LIDOCAINE HCL (PF) 1 % IJ SOLN
INTRAMUSCULAR | Status: DC | PRN
  Administered 2023-10-06: 10 mL

## 2023-10-06 MED ORDER — SODIUM CHLORIDE 0.9 % IV SOLN: INTRAVENOUS | Status: DC

## 2023-10-06 MED ORDER — LABETALOL HCL 5 MG/ML IV SOLN
10.0000 mg | INTRAVENOUS | Status: DC | PRN
  Administered 2023-10-06: 10 mg via INTRAVENOUS

## 2023-10-06 MED ORDER — LIDOCAINE HCL (PF) 1 % IJ SOLN
INTRAMUSCULAR | Status: AC
  Filled 2023-10-06: qty 30

## 2023-10-06 MED ORDER — SODIUM CHLORIDE 0.9 % WEIGHT BASED INFUSION: 1.0000 mL/kg/h | INTRAVENOUS | Status: DC

## 2023-10-06 MED ORDER — ONDANSETRON HCL 4 MG/2ML IJ SOLN: 4.0000 mg | Freq: Four times a day (QID) | INTRAMUSCULAR | Status: DC | PRN

## 2023-10-06 MED ORDER — HYDRALAZINE HCL 20 MG/ML IJ SOLN: 5.0000 mg | INTRAMUSCULAR | Status: DC | PRN

## 2023-10-06 MED ORDER — LABETALOL HCL 5 MG/ML IV SOLN
INTRAVENOUS | Status: DC
Start: 2023-10-06 — End: 2023-10-07
  Filled 2023-10-06: qty 4

## 2023-10-06 MED ORDER — FENTANYL CITRATE (PF) 100 MCG/2ML IJ SOLN
INTRAMUSCULAR | Status: AC
  Filled 2023-10-06: qty 2

## 2023-10-06 MED ORDER — IODIXANOL 320 MG/ML IV SOLN
INTRAVENOUS | Status: DC | PRN
  Administered 2023-10-06: 75 mL via INTRA_ARTERIAL

## 2023-10-06 MED ORDER — ACETAMINOPHEN 325 MG PO TABS: 650.0000 mg | ORAL_TABLET | ORAL | Status: DC | PRN

## 2023-10-06 MED ORDER — SODIUM CHLORIDE 0.9% FLUSH: 3.0000 mL | INTRAVENOUS | Status: DC | PRN

## 2023-10-06 MED ORDER — MORPHINE SULFATE (PF) 2 MG/ML IV SOLN: 2.0000 mg | INTRAVENOUS | Status: DC | PRN

## 2023-10-06 MED ORDER — OXYCODONE HCL 5 MG PO TABS: 5.0000 mg | ORAL_TABLET | ORAL | Status: DC | PRN

## 2023-10-06 MED ORDER — FENTANYL CITRATE (PF) 100 MCG/2ML IJ SOLN
INTRAMUSCULAR | Status: DC | PRN
  Administered 2023-10-06: 50 ug via INTRAVENOUS

## 2023-10-06 MED ORDER — SODIUM CHLORIDE 0.9 % IV SOLN: 250.0000 mL | INTRAVENOUS | Status: DC | PRN

## 2023-10-06 MED ORDER — MIDAZOLAM HCL 2 MG/2ML IJ SOLN
INTRAMUSCULAR | Status: AC
  Filled 2023-10-06: qty 2

## 2023-10-06 MED ORDER — SODIUM CHLORIDE 0.9% FLUSH: 3.0000 mL | Freq: Two times a day (BID) | INTRAVENOUS | Status: DC

## 2023-10-06 MED ORDER — MIDAZOLAM HCL 2 MG/2ML IJ SOLN
INTRAMUSCULAR | Status: DC | PRN
  Administered 2023-10-06: 2 mg via INTRAVENOUS

## 2023-10-06 SURGICAL SUPPLY — 8 items
CATH OMNI FLUSH 5F 65CM (CATHETERS) IMPLANT
CLOSURE MYNX CONTROL 5F (Vascular Products) IMPLANT
KIT MICROPUNCTURE NIT STIFF (SHEATH) IMPLANT
SET ATX-X65L (MISCELLANEOUS) IMPLANT
SHEATH PINNACLE 5F 10CM (SHEATH) IMPLANT
SHEATH PROBE COVER 6X72 (BAG) IMPLANT
TRAY PV CATH (CUSTOM PROCEDURE TRAY) ×2 IMPLANT
WIRE BENTSON .035X145CM (WIRE) IMPLANT

## 2023-10-06 NOTE — Op Note (Signed)
    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 Randie Heinz, MD Procedure Performed: 1.  Percutaneous guided access and Mynx device closure right common femoral artery 2.  Catheter aorta and aortogram 3.  Selection left common femoral artery and left lower extremity angiogram 4.  Right lower extremity angiogram 5.  Moderate sedation with fentanyl and Versed for 24 minutes  Indications: 83 year old female with a history of a right lower extremity bypass with monophasic waveforms throughout the bypass but without any evidence of impending graft failure.  She has undergone balloon angioplasty of the bypass in the past.  She now has more concerning ischemic rest pain of the left lower extremity and is indicated for angiography with possible intervention.  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  Bilateral common and external iliac arteries are patent.  The right lower extremity the common femoral arteries.  There appears to be a profunda stenosis and there is a bypass that originates in the common femoral and is patent all the way to the below-knee popliteal artery.  This does have disadvantage runoff single-vessel via the anterior tibial artery and visualizing the right foot was difficult given the timing of contrast but I would not consider any intervention on this bypass graft unless it was having worsened velocities given the disadvantage tibial flow.  On the left side she has common femoral artery stenosis approximately 60% which is heavily calcified the profunda is patent.  The SFA is very diminutive and an occluded for approximately 20 cm and reconstitutes above the knee with two-vessel runoff via the anterior tibial artery which is disease  in the midsegment and the peroneal artery is the dominant runoff.  Plan will be for left common femoral endarterectomy with left common femoral to above-knee popliteal artery bypass likely with PTFE.   Procedure:  The patient was identified in the holding area and taken to room 8.  The patient was then placed supine on the table and prepped and draped in the usual sterile fashion.  A time out was called.  Ultrasound was used to evaluate the right common femoral artery.  There was scar tissue there but I was able to cannulate this above the previous bypass where the artery appeared normal.  I did this with micropuncture needle followed by wire sheath with direct ultrasound visualization and an image was saved the permanent record.  Concomitantly we administered fentanyl and Versed as moderate sedation her vital signs were monitored throughout the case.  We placed a Bentson wire followed by 5 [place] sheath and an Omni catheter to the level of L1 and performed aortogram followed by pelvic angiography and then crossed the bifurcation placed a catheter in the left common femoral artery and perform left lower extremity angiography.  With the above findings I elected patient would need surgical intervention.  We removed the catheter over wire and perform right lower extremity angiography via the existing sheath.  A minx device was then deployed.  She tolerated the procedure without immediate complication.  Contrast: 75cc   Glorya Bartley C. Randie Heinz, MD [facility] Office: [phone] Pager: [phone]

## 2023-10-06 NOTE — Progress Notes (Signed)
 Up and walked and tolerated well; right groin stable, no bleeding or hematoma

## 2023-10-06 NOTE — Interval H&P Note (Signed)
 History and Physical Interval Note:  10/06/2023 2:09 PM  Donna Garza  has presented today for surgery, with the diagnosis of atherosclerosis lt lower ext with leg pain  [id number].  The various methods of treatment have been discussed with the patient and family. After consideration of risks, benefits and other options for treatment, the patient has consented to  Procedure(s): ABDOMINAL AORTOGRAM W/LOWER EXTREMITY (N/A) as a surgical intervention.  The patient's history has been reviewed, patient examined, no change in status, stable for surgery.  I have reviewed the patient's chart and labs.  Questions were answered to the patient's satisfaction.     Lemar Livings

## 2023-10-09 ENCOUNTER — Telehealth: Payer: Self-pay

## 2023-10-09 NOTE — Telephone Encounter (Signed)
 Patient called office in re: to surgery scheduling with Dr. Randie Heinz.  We are currently waiting for referral acceptance from [facility].  Patient was provided with telephone number for cardiology office.

## 2023-10-16 ENCOUNTER — Telehealth: Payer: Self-pay

## 2023-10-16 NOTE — Telephone Encounter (Signed)
 Patient called to ask if we were specific re: which cardiology office provided clearance.  This nurse responded that we are not specific to cardiology offices, whichever cardiology office can get her in the quickest.  Patient stated understanding.

## 2023-10-17 ENCOUNTER — Encounter (INDEPENDENT_AMBULATORY_CARE_PROVIDER_SITE_OTHER): Payer: Medicare Other | Admitting: Ophthalmology

## 2023-10-17 DIAGNOSIS — H34831 Tributary (branch) retinal vein occlusion, right eye, with macular edema: Secondary | ICD-10-CM | POA: Diagnosis not present

## 2023-10-17 DIAGNOSIS — H43813 Vitreous degeneration, bilateral: Secondary | ICD-10-CM

## 2023-10-17 DIAGNOSIS — D3131 Benign neoplasm of right choroid: Secondary | ICD-10-CM

## 2023-10-17 DIAGNOSIS — I10 Essential (primary) hypertension: Secondary | ICD-10-CM

## 2023-10-17 DIAGNOSIS — D3132 Benign neoplasm of left choroid: Secondary | ICD-10-CM

## 2023-10-17 DIAGNOSIS — H35033 Hypertensive retinopathy, bilateral: Secondary | ICD-10-CM

## 2023-10-22 ENCOUNTER — Ambulatory Visit: Attending: Cardiovascular Disease | Admitting: Cardiovascular Disease

## 2023-10-22 ENCOUNTER — Other Ambulatory Visit: Payer: Self-pay

## 2023-10-22 ENCOUNTER — Encounter: Payer: Self-pay | Admitting: Cardiovascular Disease

## 2023-10-22 VITALS — BP 150/98 | HR 71 | Ht 66.0 in | Wt 166.0 lb

## 2023-10-22 DIAGNOSIS — I739 Peripheral vascular disease, unspecified: Secondary | ICD-10-CM

## 2023-10-22 MED ORDER — OLMESARTAN MEDOXOMIL 40 MG PO TABS
40.0000 mg | ORAL_TABLET | Freq: Every day | ORAL | 3 refills | Status: DC
Start: 2023-10-22 — End: 2023-11-13

## 2023-10-22 MED ORDER — ROSUVASTATIN CALCIUM 10 MG PO TABS: 10.0000 mg | ORAL_TABLET | Freq: Every day | ORAL | 3 refills | Status: DC
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
## 2023-10-22 NOTE — Patient Instructions (Addendum)
 Medication Instructions:  Your physician has recommended you make the following change in your medication:  1) STOP taking losartan  2) START taking olmesartan 40 mg daily  3) INCREASE Crestor (rosuvastatin) to 10 mg daily  *If you need a refill on your cardiac medications before your next appointment, please call your pharmacy*  Lab Work: In 3 months - Fasting lipids and BMET - you can go to any [facility] location to have these drawn  Testing/Procedures: [employer] Your physician has requested that you have a lexiscan myoview. For further information please visit [email or web address] Please follow instruction sheet, as given.  Carotid Ultrasound Your physician has requested that you have a carotid duplex. This test is an ultrasound of the carotid arteries in your neck. It looks at blood flow through these arteries that supply the brain with blood. Allow one hour for this exam. There are no restrictions or special instructions.  Follow-Up: At [facility], you and your health needs are our priority.  As part of our continuing mission to provide you with exceptional heart care, we have created designated Provider Care Teams.  These Care Teams include your primary Cardiologist (physician) and Advanced Practice Providers (APPs -  Physician Assistants and Nurse Practitioners) who all work together to provide you with the care you need, when you need it.   Your next appointment:   1 year(s)  Provider:   Thurmon Fair, MD       1st Floor: - Lobby - Registration  - Pharmacy  - Lab - Cafe  2nd Floor: - PV Lab - Diagnostic Testing (echo, CT, nuclear med)  3rd Floor: - Vacant  4th Floor: - TCTS (cardiothoracic surgery) - AFib Clinic - Structural Heart Clinic - Vascular Surgery  - Vascular Ultrasound  5th Floor: - HeartCare Cardiology (general and EP) - Clinical Pharmacy for coumadin, hypertension, lipid, weight-loss medications, and med management  appointments    Valet parking services will be available as well.    You may go to any of these [facility] locations:   [facility] - [address] ([facility]) - [address] [phone] [address]   [place] - [address]    [place]  - [address]    [place] - [address] [employer]  - [address] [place] - [address] ([facility])  [place]   - [address]

## 2023-10-24 ENCOUNTER — Telehealth (HOSPITAL_COMMUNITY): Payer: Self-pay | Admitting: *Deleted

## 2023-10-24 NOTE — Telephone Encounter (Signed)
 Patient given detailed instructions per Myocardial Perfusion Study Information Sheet for the test on 10/27/23 at 10:15. Patient notified to arrive 15 minutes early and that it is imperative to arrive on time for appointment to keep from having the test rescheduled.  If you need to cancel or reschedule your appointment, please call the office within 24 hours of your appointment. . Patient verbalized understanding.Donna Garza

## 2023-10-26 ENCOUNTER — Encounter: Payer: Self-pay | Admitting: Cardiovascular Disease

## 2023-10-27 ENCOUNTER — Encounter: Payer: Self-pay | Admitting: Cardiovascular Disease

## 2023-10-27 ENCOUNTER — Ambulatory Visit (HOSPITAL_COMMUNITY): Attending: Cardiovascular Disease

## 2023-10-27 ENCOUNTER — Other Ambulatory Visit: Payer: Self-pay

## 2023-10-27 DIAGNOSIS — I70222 Atherosclerosis of native arteries of extremities with rest pain, left leg: Secondary | ICD-10-CM

## 2023-10-27 DIAGNOSIS — I739 Peripheral vascular disease, unspecified: Secondary | ICD-10-CM

## 2023-10-27 DIAGNOSIS — Z01810 Encounter for preprocedural cardiovascular examination: Secondary | ICD-10-CM | POA: Insufficient documentation

## 2023-10-27 LAB — MYOCARDIAL PERFUSION IMAGING
LV dias vol: 58 mL (ref 46–106)
LV sys vol: 27 mL
Nuc Stress EF: 64 %
Peak HR: 96 {beats}/min
Rest HR: 65 {beats}/min
Rest Nuclear Isotope Dose: 10.4 mCi
SDS: 1
SRS: 1
SSS: 2
ST Depression (mm): 0 mm
Stress Nuclear Isotope Dose: 31.6 mCi
TID: 1.11

## 2023-10-27 MED ORDER — REGADENOSON 0.4 MG/5ML IV SOLN
0.4000 mg | Freq: Once | INTRAVENOUS | Status: AC
Start: 2023-10-27 — End: 2023-10-27
  Administered 2023-10-27: 0.4 mg via INTRAVENOUS

## 2023-10-27 MED ORDER — TECHNETIUM TC 99M TETROFOSMIN IV KIT
10.4000 | PACK | Freq: Once | INTRAVENOUS | Status: AC | PRN
Start: 2023-10-27 — End: 2023-10-27
  Administered 2023-10-27: 10.4 via INTRAVENOUS

## 2023-10-27 MED ORDER — TECHNETIUM TC 99M TETROFOSMIN IV KIT
31.6000 | PACK | Freq: Once | INTRAVENOUS | Status: AC | PRN
  Administered 2023-10-27: 31.6 via INTRAVENOUS

## 2023-10-30 NOTE — Progress Notes (Addendum)
 Surgical Instructions   Your procedure is scheduled on Tuesday, April 8th, 2025. Report to [facility] Main Entrance "A" at 5:30 A.M., then check in with the Admitting office. Any questions or running late day of surgery: call [phone]  Questions prior to your surgery date: call [phone], Monday-Friday, 8am-4pm. If you experience any cold or flu symptoms such as cough, fever, chills, shortness of breath, etc. between now and your scheduled surgery, please notify us at the above number.     Remember:  Do not eat or drink after midnight the night before your surgery     Take these medicines the morning of surgery with A SIP OF WATER: Aspirin Amlodipine (Norvasc) Loratadine (Claritin) Omeprazole (Prilosec) Rosuvastatin (Crestor)   May take these medicines IF NEEDED: None.     One week prior to surgery, STOP taking any Aleve, Naproxen, Ibuprofen, Motrin, Advil, Goody's, BC's, all herbal medications, fish oil, and non-prescription vitamins.  This includes your Turmeric.                       Do NOT Smoke (Tobacco/Vaping) for 24 hours prior to your procedure.  If you use a CPAP at night, you may bring your mask/headgear for your overnight stay.   You will be asked to remove any contacts, glasses, piercing's, hearing aid's, dentures/partials prior to surgery. Please bring cases for these items if needed.    Patients discharged the day of surgery will not be allowed to drive home, and someone needs to stay with them for 24 hours.  SURGICAL WAITING ROOM VISITATION Patients may have no more than 2 support people in the waiting area - these visitors may rotate.   Pre-op nurse will coordinate an appropriate time for 1 ADULT support person, who may not rotate, to accompany patient in pre-op.  Children under the age of 4 must have an adult with them who is not the patient and must remain in the main waiting area with an adult.  If the patient needs to stay at the hospital  during part of their recovery, the visitor guidelines for inpatient rooms apply.  Please refer to the [facility] website for the visitor guidelines for any additional information.   If you received a COVID test during your pre-op visit  it is requested that you wear a mask when out in public, stay away from anyone that may not be feeling well and notify your surgeon if you develop symptoms. If you have been in contact with anyone that has tested positive in the last 10 days please notify you surgeon.      Pre-operative CHG Bathing Instructions   You can play a key role in reducing the risk of infection after surgery. Your skin needs to be as free of germs as possible. You can reduce the number of germs on your skin by washing with CHG (chlorhexidine gluconate) soap before surgery. CHG is an antiseptic soap that kills germs and continues to kill germs even after washing.   DO NOT use if you have an allergy to chlorhexidine/CHG or antibacterial soaps. If your skin becomes reddened or irritated, stop using the CHG and notify one of our RNs at [phone].              TAKE A SHOWER THE NIGHT BEFORE SURGERY AND THE DAY OF SURGERY    Please keep in mind the following:  DO NOT shave, including legs and underarms, 48 hours prior to surgery.   You  may shave your face before/day of surgery.  Place clean sheets on your bed the night before surgery Use a clean washcloth (not used since being washed) for each shower. DO NOT sleep with pet's night before surgery.  CHG Shower Instructions:  Wash your face and private area with normal soap. If you choose to wash your hair, wash first with your normal shampoo.  After you use shampoo/soap, rinse your hair and body thoroughly to remove shampoo/soap residue.  Turn the water OFF and apply half the bottle of CHG soap to a CLEAN washcloth.  Apply CHG soap ONLY FROM YOUR NECK DOWN TO YOUR TOES (washing for 3-5 minutes)  DO NOT use CHG soap on face, private  areas, open wounds, or sores.  Pay special attention to the area where your surgery is being performed.  If you are having back surgery, having someone wash your back for you may be helpful. Wait 2 minutes after CHG soap is applied, then you may rinse off the CHG soap.  Pat dry with a clean towel  Put on clean pajamas    Additional instructions for the day of surgery: DO NOT APPLY any lotions, deodorants, cologne, or perfumes.   Do not wear jewelry or makeup Do not wear nail polish, gel polish, artificial nails, or any other type of covering on natural nails (fingers and toes) Do not bring valuables to the hospital. [facility] is not responsible for valuables/personal belongings. Put on clean/comfortable clothes.  Please brush your teeth.  Ask your nurse before applying any prescription medications to the skin.

## 2023-10-31 ENCOUNTER — Encounter (HOSPITAL_COMMUNITY): Payer: Self-pay

## 2023-10-31 ENCOUNTER — Other Ambulatory Visit: Payer: Self-pay

## 2023-10-31 ENCOUNTER — Encounter (HOSPITAL_COMMUNITY)
Admission: RE | Admit: 2023-10-31 | Discharge: 2023-10-31 | Disposition: A | Discharge status: Home / Self Care | Source: Ambulatory Visit | Attending: Vascular Surgery | Admitting: Vascular Surgery

## 2023-10-31 VITALS — BP 178/74 | HR 71 | Temp 97.7°F | Resp 17 | Ht 66.0 in | Wt 165.4 lb

## 2023-10-31 DIAGNOSIS — I2510 Atherosclerotic heart disease of native coronary artery without angina pectoris: Secondary | ICD-10-CM | POA: Diagnosis not present

## 2023-10-31 DIAGNOSIS — I70222 Atherosclerosis of native arteries of extremities with rest pain, left leg: Secondary | ICD-10-CM

## 2023-10-31 DIAGNOSIS — E785 Hyperlipidemia, unspecified: Secondary | ICD-10-CM | POA: Insufficient documentation

## 2023-10-31 DIAGNOSIS — K449 Diaphragmatic hernia without obstruction or gangrene: Secondary | ICD-10-CM | POA: Diagnosis not present

## 2023-10-31 DIAGNOSIS — E119 Type 2 diabetes mellitus without complications: Secondary | ICD-10-CM

## 2023-10-31 DIAGNOSIS — Z01818 Encounter for other preprocedural examination: Secondary | ICD-10-CM

## 2023-10-31 DIAGNOSIS — E1122 Type 2 diabetes mellitus with diabetic chronic kidney disease: Secondary | ICD-10-CM | POA: Diagnosis not present

## 2023-10-31 DIAGNOSIS — I129 Hypertensive chronic kidney disease with stage 1 through stage 4 chronic kidney disease, or unspecified chronic kidney disease: Secondary | ICD-10-CM | POA: Diagnosis not present

## 2023-10-31 DIAGNOSIS — N182 Chronic kidney disease, stage 2 (mild): Secondary | ICD-10-CM | POA: Diagnosis not present

## 2023-10-31 DIAGNOSIS — Z01812 Encounter for preprocedural laboratory examination: Secondary | ICD-10-CM | POA: Diagnosis not present

## 2023-10-31 HISTORY — DX: Peripheral vascular disease, unspecified: I73.9

## 2023-10-31 HISTORY — DX: Anemia, unspecified: D64.9

## 2023-10-31 HISTORY — DX: Cardiac murmur, unspecified: R01.1

## 2023-10-31 LAB — CBC
HCT: 43 % (ref 36.0–46.0)
Hemoglobin: 13.7 g/dL (ref 12.0–15.0)
MCH: 26.4 pg (ref 26.0–34.0)
MCHC: 31.9 g/dL (ref 30.0–36.0)
MCV: 82.9 fL (ref 80.0–100.0)
Platelets: 291 10*3/uL (ref 150–400)
RBC: 5.19 MIL/uL — ABNORMAL HIGH (ref 3.87–5.11)
RDW: 14.4 % (ref 11.5–15.5)
WBC: 9.5 10*3/uL (ref 4.0–10.5)
nRBC: 0 % (ref 0.0–0.2)

## 2023-10-31 LAB — URINALYSIS, ROUTINE W REFLEX MICROSCOPIC
Bilirubin Urine: NEGATIVE
Glucose, UA: NEGATIVE mg/dL
Hgb urine dipstick: NEGATIVE
Ketones, ur: NEGATIVE mg/dL
Nitrite: POSITIVE — AB
Protein, ur: NEGATIVE mg/dL
Specific Gravity, Urine: 1.008 (ref 1.005–1.030)
pH: 6 (ref 5.0–8.0)

## 2023-10-31 LAB — APTT: aPTT: 27 s (ref 24–36)

## 2023-10-31 LAB — COMPREHENSIVE METABOLIC PANEL WITH GFR
ALT: 19 U/L (ref 0–44)
AST: 20 U/L (ref 15–41)
Albumin: 3.9 g/dL (ref 3.5–5.0)
Alkaline Phosphatase: 90 U/L (ref 38–126)
Anion gap: 14 (ref 5–15)
BUN: 17 mg/dL (ref 8–23)
CO2: 25 mmol/L (ref 22–32)
Calcium: 10.1 mg/dL (ref 8.9–10.3)
Chloride: 101 mmol/L (ref 98–111)
Creatinine, Ser: 1.09 mg/dL — ABNORMAL HIGH (ref 0.44–1.00)
GFR, Estimated: 51 mL/min — ABNORMAL LOW (ref >60)
Glucose, Bld: 81 mg/dL (ref 70–99)
Potassium: 3.5 mmol/L (ref 3.5–5.1)
Sodium: 140 mmol/L (ref 135–145)
Total Bilirubin: 0.5 mg/dL (ref 0.0–1.2)
Total Protein: 7.6 g/dL (ref 6.5–8.1)

## 2023-10-31 LAB — SURGICAL PCR SCREEN
MRSA, PCR: NEGATIVE
Staphylococcus aureus: NEGATIVE

## 2023-10-31 LAB — TYPE AND SCREEN
ABO/RH(D): A POS
Antibody Screen: NEGATIVE

## 2023-10-31 LAB — HEMOGLOBIN A1C
Hgb A1c MFr Bld: 6.4 % — ABNORMAL HIGH (ref 4.8–5.6)
Mean Plasma Glucose: 136.98 mg/dL

## 2023-10-31 LAB — GLUCOSE, CAPILLARY: Glucose-Capillary: 100 mg/dL — ABNORMAL HIGH (ref 70–99)

## 2023-10-31 LAB — PROTIME-INR
INR: 1 (ref 0.8–1.2)
Prothrombin Time: 13 s (ref 11.4–15.2)

## 2023-10-31 MED ORDER — AMLODIPINE BESYLATE 2.5 MG PO TABS: 2.5000 mg | ORAL_TABLET | Freq: Every day | ORAL | 3 refills | Status: AC

## 2023-10-31 NOTE — Progress Notes (Signed)
 Surgical Instructions   Your procedure is scheduled on Tuesday, April 8th, 2025. Report to [facility] Main Entrance "A" at 5:30 A.M., then check in with the Admitting office. Any questions or running late day of surgery: call [phone]  Questions prior to your surgery date: call [phone], Monday-Friday, 8am-4pm. If you experience any cold or flu symptoms such as cough, fever, chills, shortness of breath, etc. between now and your scheduled surgery, please notify us at the above number.     Remember:  Do not eat or drink after midnight the night before your surgery     Take these medicines the morning of surgery with A SIP OF WATER: Aspirin Amlodipine (Norvasc) Loratadine (Claritin) Omeprazole (Prilosec) Rosuvastatin (Crestor)   May take these medicines IF NEEDED: None.     One week prior to surgery, STOP taking any Aleve, Naproxen, Ibuprofen, Motrin, Advil, Goody's, BC's, all herbal medications, fish oil, and non-prescription vitamins.  This includes your Turmeric.    WHAT DO I DO ABOUT MY DIABETES MEDICATION?   Do not take oral diabetes medicines (pills) the morning of surgery.  The day of surgery, do not take other diabetes injectables, including Byetta (exenatide), Bydureon (exenatide ER), Victoza (liraglutide), or Trulicity (dulaglutide).  If your CBG is greater than 220 mg/dL, you may take  of your sliding scale (correction) dose of insulin.   HOW TO MANAGE YOUR DIABETES BEFORE AND AFTER SURGERY  Why is it important to control my blood sugar before and after surgery? Improving blood sugar levels before and after surgery helps healing and can limit problems. A way of improving blood sugar control is eating a healthy diet by:  Eating less sugar and carbohydrates  Increasing activity/exercise  Talking with your doctor about reaching your blood sugar goals High blood sugars (greater than 180 mg/dL) can raise your risk of infections and slow your recovery, so  you will need to focus on controlling your diabetes during the weeks before surgery. Make sure that the doctor who takes care of your diabetes knows about your planned surgery including the date and location.  How do I manage my blood sugar before surgery? Check your blood sugar at least 4 times a day, starting 2 days before surgery, to make sure that the level is not too high or low.  Check your blood sugar the morning of your surgery when you wake up and every 2 hours until you get to the Short Stay unit.  If your blood sugar is less than 70 mg/dL, you will need to treat for low blood sugar: Do not take insulin. Treat a low blood sugar (less than 70 mg/dL) with  cup of clear juice (cranberry or apple), 4 glucose tablets, OR glucose gel. Recheck blood sugar in 15 minutes after treatment (to make sure it is greater than 70 mg/dL). If your blood sugar is not greater than 70 mg/dL on recheck, call [phone] for further instructions. Report your blood sugar to the short stay nurse when you get to Short Stay.  If you are admitted to the hospital after surgery: Your blood sugar will be checked by the staff and you will probably be given insulin after surgery (instead of oral diabetes medicines) to make sure you have good blood sugar levels. The goal for blood sugar control after surgery is 80-180 mg/dL.                     Do NOT Smoke (Tobacco/Vaping) for 24 hours prior to your  procedure.  If you use a CPAP at night, you may bring your mask/headgear for your overnight stay.   You will be asked to remove any contacts, glasses, piercing's, hearing aid's, dentures/partials prior to surgery. Please bring cases for these items if needed.    Patients discharged the day of surgery will not be allowed to drive home, and someone needs to stay with them for 24 hours.  SURGICAL WAITING ROOM VISITATION Patients may have no more than 2 support people in the waiting area - these visitors may rotate.    Pre-op nurse will coordinate an appropriate time for 1 ADULT support person, who may not rotate, to accompany patient in pre-op.  Children under the age of 47 must have an adult with them who is not the patient and must remain in the main waiting area with an adult.  If the patient needs to stay at the hospital during part of their recovery, the visitor guidelines for inpatient rooms apply.  Please refer to the [facility] website for the visitor guidelines for any additional information.   If you received a COVID test during your pre-op visit  it is requested that you wear a mask when out in public, stay away from anyone that may not be feeling well and notify your surgeon if you develop symptoms. If you have been in contact with anyone that has tested positive in the last 10 days please notify you surgeon.      Pre-operative CHG Bathing Instructions   You can play a key role in reducing the risk of infection after surgery. Your skin needs to be as free of germs as possible. You can reduce the number of germs on your skin by washing with CHG (chlorhexidine gluconate) soap before surgery. CHG is an antiseptic soap that kills germs and continues to kill germs even after washing.   DO NOT use if you have an allergy to chlorhexidine/CHG or antibacterial soaps. If your skin becomes reddened or irritated, stop using the CHG and notify one of our RNs at [phone].              TAKE A SHOWER THE NIGHT BEFORE SURGERY AND THE DAY OF SURGERY    Please keep in mind the following:  DO NOT shave, including legs and underarms, 48 hours prior to surgery.   You may shave your face before/day of surgery.  Place clean sheets on your bed the night before surgery Use a clean washcloth (not used since being washed) for each shower. DO NOT sleep with pet's night before surgery.  CHG Shower Instructions:  Wash your face and private area with normal soap. If you choose to wash your hair, wash first with  your normal shampoo.  After you use shampoo/soap, rinse your hair and body thoroughly to remove shampoo/soap residue.  Turn the water OFF and apply half the bottle of CHG soap to a CLEAN washcloth.  Apply CHG soap ONLY FROM YOUR NECK DOWN TO YOUR TOES (washing for 3-5 minutes)  DO NOT use CHG soap on face, private areas, open wounds, or sores.  Pay special attention to the area where your surgery is being performed.  If you are having back surgery, having someone wash your back for you may be helpful. Wait 2 minutes after CHG soap is applied, then you may rinse off the CHG soap.  Pat dry with a clean towel  Put on clean pajamas    Additional instructions for the day of surgery: DO  NOT APPLY any lotions, deodorants, cologne, or perfumes.   Do not wear jewelry or makeup Do not wear nail polish, gel polish, artificial nails, or any other type of covering on natural nails (fingers and toes) Do not bring valuables to the hospital. [facility] is not responsible for valuables/personal belongings. Put on clean/comfortable clothes.  Please brush your teeth.  Ask your nurse before applying any prescription medications to the skin.

## 2023-10-31 NOTE — Telephone Encounter (Signed)
 Pt calling back saying medication has not been called it. Please advise

## 2023-10-31 NOTE — Progress Notes (Signed)
 Notified Jill Side., RN, about pt's abnormal UA

## 2023-10-31 NOTE — Progress Notes (Signed)
 PCP - Dr. Fara Chute Cardiologist - Dr. Rachelle Hora Croitoru  PPM/ICD - denies   Chest x-ray - 04/01/12 EKG - 10/06/23 Stress Test - 10/27/23 ECHO - denies Cardiac Cath - denies  Sleep Study - denies   Fasting Blood Sugar - 110-150 Checks Blood Sugar once a day  Last dose of GLP1 agonist-  n/a   Blood Thinner Instructions: n/a Aspirin Instructions: continue  ERAS Protcol - no, NPO   COVID TEST- n/a   Anesthesia review: yes, elevated BP at PAT. Pt was advised to contact Dr. Erin Hearing office regarding Amlodipine. Pt was supposed to start this but says she has not received a prescription. Cardiac clearance.  Patient denies shortness of breath, fever, cough and chest pain at PAT appointment   All instructions explained to the patient, with a verbal understanding of the material. Patient agrees to go over the instructions while at home for a better understanding.  The opportunity to ask questions was provided.

## 2023-11-03 NOTE — Anesthesia Preprocedure Evaluation (Addendum)
 Anesthesia Evaluation  Patient identified by MRN, date of birth, ID band Patient awake    Reviewed: Allergy & Precautions, NPO status , Patient's Chart, lab work & pertinent test results  History of Anesthesia Complications Negative for: history of anesthetic complications  Airway Mallampati: III  TM Distance: >3 FB Neck ROM: Full    Dental  (+) Dental Advisory Given   Pulmonary neg shortness of breath, neg sleep apnea, neg COPD, neg recent URI   breath sounds clear to auscultation       Cardiovascular hypertension, + Peripheral Vascular Disease  + Valvular Problems/Murmurs  Rhythm:Regular     Neuro/Psych  Neuromuscular disease  negative psych ROS   GI/Hepatic hiatal hernia,GERD  ,,  Endo/Other  diabetes    Renal/GU Lab Results      Component                Value               Date                      NA                       140                 10/31/2023                K                        3.5                 10/31/2023                CO2                      25                  10/31/2023                GLUCOSE                  81                  10/31/2023                BUN                      17                  10/31/2023                CREATININE               1.09 (H)            10/31/2023                CALCIUM                  10.1                10/31/2023                GFRNONAA                 51 (L)              10/31/2023  Musculoskeletal   Abdominal   Peds  Hematology Lab Results      Component                Value               Date                      WBC                      9.5                 10/31/2023                HGB                      13.7                10/31/2023                HCT                      43.0                10/31/2023                MCV                      82.9                10/31/2023                PLT                      291                  10/31/2023              Anesthesia Other Findings   Reproductive/Obstetrics                             Anesthesia Physical Anesthesia Plan  ASA: 3  Anesthesia Plan: General   Post-op Pain Management:    Induction: Intravenous  PONV Risk Score and Plan: 4 or greater and Ondansetron and Dexamethasone  Airway Management Planned: Oral ETT  Additional Equipment: Arterial line  Intra-op Plan:   Post-operative Plan: Extubation in OR  Informed Consent: I have reviewed the patients History and Physical, chart, labs and discussed the procedure including the risks, benefits and alternatives for the proposed anesthesia with the patient or authorized representative who has indicated his/her understanding and acceptance.     Dental advisory given  Plan Discussed with: CRNA  Anesthesia Plan Comments: (PAT note by Rudy Costain, PA-C: 83 year old female follows with cardiology for history of HTN, HLD, extensive history of PAD (previous right common femoral to popliteal SVG bypass graft 2013, subsequent angioplasty 2014, now with single-vessel anterior tibial artery runoff on the right, monophasic flow in the right lower extremity with velocities suggestive of impending right bypass graft failure).  Last seen by Dr. Alvis Ba on 10/22/2023 and noted to have left lower extremity rest pain and a long segment of total occlusion of the left superficial femoral artery with plan for left common femoral endarterectomy with left common femoral to above-knee popliteal artery bypass.  Per note, "Preop CV risk evaluation: Very high risk  for having significant CAD due to several uncontrolled risk factors and well-established long history of PAD.  Symptoms of coronary insufficiency may be masked by inability to perform physical exertion due to claudication.  Unable to perform treadmill stress testing for the same reason.  Will schedule her for a myocardial perfusion study.  If the study is  high risk she will need to undergo coronary angiography before she undergoes a high risk vascular surgical procedure."  Stress test 10/27/2023 was low risk.  Dr. Alvis Ba commented on result stating, "Good news on stress test - no sign of any serious coronary blockages. BP is still too high. Please start amlodipine 2.5 mg daily, continue olmesartan and hydrochlorothiazide. As long as BP is in reasonable range, low risk for major cardiac complications with the planned fem-pop bypass surgery."  Other pertinent history includes CKD 2, GERD on PPI, hiatal hernia, diet controlled DM2  Preop labs reviewed, unremarkable.  A1c 6.4.  EKG 10/06/2023: Normal sinus rhythm.  Rate 82. Minimal voltage criteria for LVH, may be normal variant ( R in aVL )  Nuclear stress 10/27/2023:   The study is normal. The study is low risk.   No ST deviation was noted.   LV perfusion is normal.   Left ventricular function is normal. Nuclear stress EF: 64%. The left ventricular ejection fraction is normal (55-65%). End diastolic cavity size is normal.   Electronically signed by Luana Rumple, MD   Low risk stress nuclear study with normal perfusion and normal left ventricular regional and global systolic function. Note severely elevated blood pressure.   )         Anesthesia Quick Evaluation

## 2023-11-03 NOTE — Progress Notes (Signed)
 Anesthesia Chart Review:  83 year old female follows with cardiology for history of HTN, HLD, extensive history of PAD (previous right common femoral to popliteal SVG bypass graft 2013, subsequent angioplasty 2014, now with single-vessel anterior tibial artery runoff on the right, monophasic flow in the right lower extremity with velocities suggestive of impending right bypass graft failure).  Last seen by Dr. Royann Shivers on 10/22/2023 and noted to have left lower extremity rest pain and a long segment of total occlusion of the left superficial femoral artery with plan for left common femoral endarterectomy with left common femoral to above-knee popliteal artery bypass.  Per note, "Preop CV risk evaluation: Very high risk for having significant CAD due to several uncontrolled risk factors and well-established long history of PAD.  Symptoms of coronary insufficiency may be masked by inability to perform physical exertion due to claudication.  Unable to perform treadmill stress testing for the same reason.  Will schedule her for a myocardial perfusion study.  If the study is high risk she will need to undergo coronary angiography before she undergoes a high risk vascular surgical procedure."  Stress test 10/27/2023 was low risk.  Dr. Royann Shivers commented on result stating, "Good news on stress test - no sign of any serious coronary blockages. BP is still too high. Please start amlodipine 2.5 mg daily, continue olmesartan and hydrochlorothiazide. As long as BP is in reasonable range, low risk for major cardiac complications with the planned fem-pop bypass surgery."  Other pertinent history includes CKD 2, GERD on PPI, hiatal hernia, diet controlled DM2  Preop labs reviewed, unremarkable.  A1c 6.4.  EKG 10/06/2023: Normal sinus rhythm.  Rate 82. Minimal voltage criteria for LVH, may be normal variant ( R in aVL )  Nuclear stress 10/27/2023:   The study is normal. The study is low risk.   No ST deviation was noted.    LV perfusion is normal.   Left ventricular function is normal. Nuclear stress EF: 64%. The left ventricular ejection fraction is normal (55-65%). End diastolic cavity size is normal.   Electronically signed by Thurmon Fair, MD   Low risk stress nuclear study with normal perfusion and normal left ventricular regional and global systolic function. Note severely elevated blood pressure.    Zannie Cove [facility] Short Stay Center/Anesthesiology Phone [phone] 11/03/2023 11:48 AM

## 2023-11-06 ENCOUNTER — Encounter: Payer: Self-pay | Admitting: Cardiovascular Disease

## 2023-11-06 ENCOUNTER — Ambulatory Visit (HOSPITAL_COMMUNITY)
Admission: RE | Admit: 2023-11-06 | Discharge: 2023-11-06 | Disposition: A | Discharge status: Home / Self Care | Source: Ambulatory Visit | Attending: Internal Medicine | Admitting: Internal Medicine

## 2023-11-06 DIAGNOSIS — I739 Peripheral vascular disease, unspecified: Secondary | ICD-10-CM | POA: Insufficient documentation

## 2023-11-06 DIAGNOSIS — R0989 Other specified symptoms and signs involving the circulatory and respiratory systems: Secondary | ICD-10-CM | POA: Insufficient documentation

## 2023-11-11 ENCOUNTER — Inpatient Hospital Stay (HOSPITAL_COMMUNITY)
Admission: RE | Admit: 2023-11-11 | Discharge: 2023-11-13 | DRG: 271 | Disposition: A | Discharge status: Home / Self Care | Attending: Vascular Surgery | Admitting: Vascular Surgery

## 2023-11-11 ENCOUNTER — Inpatient Hospital Stay (HOSPITAL_COMMUNITY): Payer: Self-pay | Admitting: Physician Assistant

## 2023-11-11 ENCOUNTER — Other Ambulatory Visit: Payer: Self-pay

## 2023-11-11 ENCOUNTER — Inpatient Hospital Stay (HOSPITAL_COMMUNITY)

## 2023-11-11 ENCOUNTER — Encounter (HOSPITAL_COMMUNITY)
Admission: RE | Disposition: A | Discharge status: Home / Self Care | Payer: Self-pay | Source: Home / Self Care | Attending: Vascular Surgery

## 2023-11-11 DIAGNOSIS — Z888 Allergy status to other drugs, medicaments and biological substances status: Secondary | ICD-10-CM | POA: Diagnosis not present

## 2023-11-11 DIAGNOSIS — N182 Chronic kidney disease, stage 2 (mild): Secondary | ICD-10-CM | POA: Diagnosis not present

## 2023-11-11 DIAGNOSIS — Z803 Family history of malignant neoplasm of breast: Secondary | ICD-10-CM

## 2023-11-11 DIAGNOSIS — Z90710 Acquired absence of both cervix and uterus: Secondary | ICD-10-CM | POA: Diagnosis not present

## 2023-11-11 DIAGNOSIS — Z7982 Long term (current) use of aspirin: Secondary | ICD-10-CM

## 2023-11-11 DIAGNOSIS — E1122 Type 2 diabetes mellitus with diabetic chronic kidney disease: Secondary | ICD-10-CM | POA: Diagnosis not present

## 2023-11-11 DIAGNOSIS — E119 Type 2 diabetes mellitus without complications: Secondary | ICD-10-CM

## 2023-11-11 DIAGNOSIS — I70222 Atherosclerosis of native arteries of extremities with rest pain, left leg: Secondary | ICD-10-CM | POA: Diagnosis not present

## 2023-11-11 DIAGNOSIS — N189 Chronic kidney disease, unspecified: Secondary | ICD-10-CM | POA: Diagnosis not present

## 2023-11-11 DIAGNOSIS — R011 Cardiac murmur, unspecified: Secondary | ICD-10-CM | POA: Diagnosis present

## 2023-11-11 DIAGNOSIS — Z83438 Family history of other disorder of lipoprotein metabolism and other lipidemia: Secondary | ICD-10-CM

## 2023-11-11 DIAGNOSIS — K219 Gastro-esophageal reflux disease without esophagitis: Secondary | ICD-10-CM | POA: Diagnosis present

## 2023-11-11 DIAGNOSIS — Z79899 Other long term (current) drug therapy: Secondary | ICD-10-CM

## 2023-11-11 DIAGNOSIS — Z860101 Personal history of adenomatous and serrated colon polyps: Secondary | ICD-10-CM

## 2023-11-11 DIAGNOSIS — I739 Peripheral vascular disease, unspecified: Secondary | ICD-10-CM | POA: Diagnosis present

## 2023-11-11 DIAGNOSIS — Z95828 Presence of other vascular implants and grafts: Secondary | ICD-10-CM | POA: Diagnosis not present

## 2023-11-11 DIAGNOSIS — Z8249 Family history of ischemic heart disease and other diseases of the circulatory system: Secondary | ICD-10-CM | POA: Diagnosis not present

## 2023-11-11 DIAGNOSIS — I129 Hypertensive chronic kidney disease with stage 1 through stage 4 chronic kidney disease, or unspecified chronic kidney disease: Secondary | ICD-10-CM | POA: Diagnosis not present

## 2023-11-11 DIAGNOSIS — I70202 Unspecified atherosclerosis of native arteries of extremities, left leg: Principal | ICD-10-CM | POA: Diagnosis present

## 2023-11-11 DIAGNOSIS — D62 Acute posthemorrhagic anemia: Secondary | ICD-10-CM | POA: Diagnosis not present

## 2023-11-11 DIAGNOSIS — Z820 Family history of epilepsy and other diseases of the nervous system: Secondary | ICD-10-CM

## 2023-11-11 DIAGNOSIS — Z9889 Other specified postprocedural states: Secondary | ICD-10-CM | POA: Diagnosis not present

## 2023-11-11 DIAGNOSIS — E1151 Type 2 diabetes mellitus with diabetic peripheral angiopathy without gangrene: Secondary | ICD-10-CM | POA: Diagnosis not present

## 2023-11-11 DIAGNOSIS — Z9841 Cataract extraction status, right eye: Secondary | ICD-10-CM

## 2023-11-11 HISTORY — PX: ENDARTERECTOMY FEMORAL: SHX5804

## 2023-11-11 HISTORY — PX: FEMORAL-POPLITEAL BYPASS GRAFT: SHX937

## 2023-11-11 LAB — GLUCOSE, CAPILLARY
Glucose-Capillary: 112 mg/dL — ABNORMAL HIGH (ref 70–99)
Glucose-Capillary: 138 mg/dL — ABNORMAL HIGH (ref 70–99)

## 2023-11-11 LAB — POCT ACTIVATED CLOTTING TIME
Activated Clotting Time: 273 s
Activated Clotting Time: 129 s
Activated Clotting Time: 239 s
Activated Clotting Time: 262 s

## 2023-11-11 SURGERY — ENDARTERECTOMY, FEMORAL
Anesthesia: General | Site: Leg Upper | Laterality: Left

## 2023-11-11 MED ORDER — METOPROLOL TARTRATE 5 MG/5ML IV SOLN: 2.0000 mg | INTRAVENOUS | Status: DC | PRN

## 2023-11-11 MED ORDER — ONDANSETRON HCL 4 MG/2ML IJ SOLN: 4.0000 mg | Freq: Four times a day (QID) | INTRAMUSCULAR | Status: DC | PRN

## 2023-11-11 MED ORDER — SODIUM CHLORIDE 0.9 % IV SOLN: INTRAVENOUS | Status: DC

## 2023-11-11 MED ORDER — PROPOFOL 10 MG/ML IV BOLUS
INTRAVENOUS | Status: AC
  Filled 2023-11-11: qty 20

## 2023-11-11 MED ORDER — PHENYLEPHRINE HCL-NACL 20-0.9 MG/250ML-% IV SOLN
INTRAVENOUS | Status: DC | PRN
  Administered 2023-11-11: 40 ug/min via INTRAVENOUS

## 2023-11-11 MED ORDER — SENNOSIDES-DOCUSATE SODIUM 8.6-50 MG PO TABS: 1.0000 | ORAL_TABLET | Freq: Every evening | ORAL | Status: DC | PRN

## 2023-11-11 MED ORDER — PANTOPRAZOLE SODIUM 40 MG PO TBEC
40.0000 mg | DELAYED_RELEASE_TABLET | Freq: Every day | ORAL | Status: DC
  Administered 2023-11-11 – 2023-11-13 (×3): 40 mg via ORAL
  Filled 2023-11-11 (×3): qty 1

## 2023-11-11 MED ORDER — LABETALOL HCL 5 MG/ML IV SOLN
10.0000 mg | INTRAVENOUS | Status: DC | PRN
  Administered 2023-11-12 (×2): 10 mg via INTRAVENOUS
  Filled 2023-11-11 (×2): qty 4

## 2023-11-11 MED ORDER — CHLORHEXIDINE GLUCONATE CLOTH 2 % EX PADS
6.0000 | MEDICATED_PAD | Freq: Once | CUTANEOUS | Status: DC
Start: 2023-11-11 — End: 2023-11-11

## 2023-11-11 MED ORDER — AMLODIPINE BESYLATE 5 MG PO TABS
2.5000 mg | ORAL_TABLET | Freq: Every day | ORAL | Status: DC
  Administered 2023-11-12 – 2023-11-13 (×2): 2.5 mg via ORAL
  Filled 2023-11-11 (×2): qty 1

## 2023-11-11 MED ORDER — ALBUMIN HUMAN 5 % IV SOLN
INTRAVENOUS | Status: AC
  Filled 2023-11-11: qty 250

## 2023-11-11 MED ORDER — FENTANYL CITRATE (PF) 250 MCG/5ML IJ SOLN
INTRAMUSCULAR | Status: AC
  Filled 2023-11-11: qty 5

## 2023-11-11 MED ORDER — DEXAMETHASONE SODIUM PHOSPHATE 10 MG/ML IJ SOLN
INTRAMUSCULAR | Status: AC
Start: 2023-11-11 — End: ?
  Filled 2023-11-11: qty 1

## 2023-11-11 MED ORDER — SODIUM CHLORIDE 0.9 % IV SOLN: 500.0000 mL | Freq: Once | INTRAVENOUS | Status: DC | PRN

## 2023-11-11 MED ORDER — FENTANYL CITRATE (PF) 250 MCG/5ML IJ SOLN
INTRAMUSCULAR | Status: DC | PRN
Start: 2023-11-11 — End: 2023-11-11
  Administered 2023-11-11: 50 ug via INTRAVENOUS
  Administered 2023-11-11: 100 ug via INTRAVENOUS

## 2023-11-11 MED ORDER — 0.9 % SODIUM CHLORIDE (POUR BTL) OPTIME
TOPICAL | Status: DC | PRN
Start: 2023-11-11 — End: 2023-11-11
  Administered 2023-11-11: 1000 mL

## 2023-11-11 MED ORDER — PANTOPRAZOLE SODIUM 40 MG PO TBEC: 40.0000 mg | DELAYED_RELEASE_TABLET | Freq: Every day | ORAL | Status: DC

## 2023-11-11 MED ORDER — HYDROCHLOROTHIAZIDE 25 MG PO TABS: 25.0000 mg | ORAL_TABLET | Freq: Every day | ORAL | Status: DC

## 2023-11-11 MED ORDER — ONDANSETRON HCL 4 MG/2ML IJ SOLN
INTRAMUSCULAR | Status: AC
  Filled 2023-11-11: qty 2

## 2023-11-11 MED ORDER — DORZOLAMIDE HCL 2 % OP SOLN
1.0000 [drp] | Freq: Two times a day (BID) | OPHTHALMIC | Status: DC
  Administered 2023-11-11 – 2023-11-13 (×4): 1 [drp] via OPHTHALMIC
  Filled 2023-11-11: qty 10

## 2023-11-11 MED ORDER — ROCURONIUM BROMIDE 10 MG/ML (PF) SYRINGE
PREFILLED_SYRINGE | INTRAVENOUS | Status: DC | PRN
Start: 2023-11-11 — End: 2023-11-11
  Administered 2023-11-11: 60 mg via INTRAVENOUS
  Administered 2023-11-11: 20 mg via INTRAVENOUS

## 2023-11-11 MED ORDER — PHENYLEPHRINE 80 MCG/ML (10ML) SYRINGE FOR IV PUSH (FOR BLOOD PRESSURE SUPPORT)
PREFILLED_SYRINGE | INTRAVENOUS | Status: DC | PRN
Start: 2023-11-11 — End: 2023-11-11
  Administered 2023-11-11 (×3): 80 ug via INTRAVENOUS

## 2023-11-11 MED ORDER — MAGNESIUM SULFATE 2 GM/50ML IV SOLN: 2.0000 g | Freq: Every day | INTRAVENOUS | Status: DC | PRN

## 2023-11-11 MED ORDER — HYDROMORPHONE HCL 1 MG/ML IJ SOLN: 0.5000 mg | INTRAMUSCULAR | Status: DC | PRN

## 2023-11-11 MED ORDER — ORAL CARE MOUTH RINSE: 15.0000 mL | Freq: Once | OROMUCOSAL | Status: AC

## 2023-11-11 MED ORDER — HEPARIN SODIUM (PORCINE) 1000 UNIT/ML IJ SOLN
INTRAMUSCULAR | Status: DC | PRN
  Administered 2023-11-11: 4000 [IU] via INTRAVENOUS
  Administered 2023-11-11: 8000 [IU] via INTRAVENOUS

## 2023-11-11 MED ORDER — HEPARIN SODIUM (PORCINE) 5000 UNIT/ML IJ SOLN
5000.0000 [IU] | Freq: Three times a day (TID) | INTRAMUSCULAR | Status: DC
  Administered 2023-11-12 – 2023-11-13 (×4): 5000 [IU] via SUBCUTANEOUS
  Filled 2023-11-11 (×4): qty 1

## 2023-11-11 MED ORDER — LIDOCAINE 2% (20 MG/ML) 5 ML SYRINGE
INTRAMUSCULAR | Status: AC
Start: 2023-11-11 — End: ?
  Filled 2023-11-11: qty 5

## 2023-11-11 MED ORDER — ALUM & MAG HYDROXIDE-SIMETH 200-200-20 MG/5ML PO SUSP: 15.0000 mL | ORAL | Status: DC | PRN

## 2023-11-11 MED ORDER — GUAIFENESIN-DM 100-10 MG/5ML PO SYRP: 15.0000 mL | ORAL_SOLUTION | ORAL | Status: DC | PRN

## 2023-11-11 MED ORDER — ALBUMIN HUMAN 5 % IV SOLN
12.5000 g | Freq: Once | INTRAVENOUS | Status: AC
  Administered 2023-11-11: 12.5 g via INTRAVENOUS

## 2023-11-11 MED ORDER — INSULIN ASPART 100 UNIT/ML IJ SOLN: 0.0000 [IU] | INTRAMUSCULAR | Status: DC | PRN

## 2023-11-11 MED ORDER — LORATADINE 10 MG PO TABS
10.0000 mg | ORAL_TABLET | Freq: Every day | ORAL | Status: DC
  Administered 2023-11-12 – 2023-11-13 (×2): 10 mg via ORAL
  Filled 2023-11-11 (×2): qty 1

## 2023-11-11 MED ORDER — GLYCOPYRROLATE 0.2 MG/ML IJ SOLN
INTRAMUSCULAR | Status: DC | PRN
  Administered 2023-11-11: .2 mg via INTRAVENOUS

## 2023-11-11 MED ORDER — PROTAMINE SULFATE 10 MG/ML IV SOLN
INTRAVENOUS | Status: AC
Start: 2023-11-11 — End: ?
  Filled 2023-11-11: qty 5

## 2023-11-11 MED ORDER — HEPARIN 6000 UNIT IRRIGATION SOLUTION
Status: AC
  Filled 2023-11-11: qty 500

## 2023-11-11 MED ORDER — ASPIRIN 81 MG PO CHEW
81.0000 mg | CHEWABLE_TABLET | Freq: Every day | ORAL | Status: DC
  Administered 2023-11-12 – 2023-11-13 (×2): 81 mg via ORAL
  Filled 2023-11-11 (×2): qty 1

## 2023-11-11 MED ORDER — HEPARIN 6000 UNIT IRRIGATION SOLUTION
Status: DC | PRN
  Administered 2023-11-11: 1

## 2023-11-11 MED ORDER — DEXAMETHASONE SODIUM PHOSPHATE 10 MG/ML IJ SOLN
INTRAMUSCULAR | Status: DC | PRN
  Administered 2023-11-11: 10 mg via INTRAVENOUS

## 2023-11-11 MED ORDER — LACTATED RINGERS IV SOLN: INTRAVENOUS | Status: DC

## 2023-11-11 MED ORDER — HEPARIN SODIUM (PORCINE) 1000 UNIT/ML IJ SOLN
INTRAMUSCULAR | Status: AC
  Filled 2023-11-11: qty 20

## 2023-11-11 MED ORDER — HYDRALAZINE HCL 20 MG/ML IJ SOLN: 5.0000 mg | INTRAMUSCULAR | Status: DC | PRN

## 2023-11-11 MED ORDER — PROPOFOL 10 MG/ML IV BOLUS
INTRAVENOUS | Status: DC | PRN
  Administered 2023-11-11: 90 mg via INTRAVENOUS
  Administered 2023-11-11: 20 mg via INTRAVENOUS

## 2023-11-11 MED ORDER — ONDANSETRON HCL 4 MG/2ML IJ SOLN
INTRAMUSCULAR | Status: DC | PRN
Start: 2023-11-11 — End: 2023-11-11
  Administered 2023-11-11: 4 mg via INTRAVENOUS

## 2023-11-11 MED ORDER — ROSUVASTATIN CALCIUM 5 MG PO TABS
10.0000 mg | ORAL_TABLET | Freq: Every day | ORAL | Status: DC
  Administered 2023-11-11 – 2023-11-12 (×2): 10 mg via ORAL
  Filled 2023-11-11 (×2): qty 2

## 2023-11-11 MED ORDER — CHLORHEXIDINE GLUCONATE CLOTH 2 % EX PADS: 6.0000 | MEDICATED_PAD | Freq: Once | CUTANEOUS | Status: DC

## 2023-11-11 MED ORDER — CEFAZOLIN SODIUM-DEXTROSE 2-4 GM/100ML-% IV SOLN
2.0000 g | Freq: Three times a day (TID) | INTRAVENOUS | Status: AC
  Administered 2023-11-11 (×2): 2 g via INTRAVENOUS
  Filled 2023-11-11 (×2): qty 100

## 2023-11-11 MED ORDER — ACETAMINOPHEN 325 MG PO TABS: 325.0000 mg | ORAL_TABLET | ORAL | Status: DC | PRN

## 2023-11-11 MED ORDER — LOSARTAN POTASSIUM 50 MG PO TABS
100.0000 mg | ORAL_TABLET | Freq: Every day | ORAL | Status: DC
  Administered 2023-11-12 – 2023-11-13 (×2): 100 mg via ORAL
  Filled 2023-11-11 (×2): qty 2

## 2023-11-11 MED ORDER — BISACODYL 5 MG PO TBEC: 5.0000 mg | DELAYED_RELEASE_TABLET | Freq: Every day | ORAL | Status: DC | PRN

## 2023-11-11 MED ORDER — PHENOL 1.4 % MT LIQD: 1.0000 | OROMUCOSAL | Status: DC | PRN

## 2023-11-11 MED ORDER — PROTAMINE SULFATE 10 MG/ML IV SOLN
INTRAVENOUS | Status: DC | PRN
  Administered 2023-11-11: 50 mg via INTRAVENOUS

## 2023-11-11 MED ORDER — OXYCODONE HCL 5 MG PO TABS: 5.0000 mg | ORAL_TABLET | ORAL | Status: DC | PRN

## 2023-11-11 MED ORDER — DOCUSATE SODIUM 100 MG PO CAPS
100.0000 mg | ORAL_CAPSULE | Freq: Every day | ORAL | Status: DC
  Administered 2023-11-12 – 2023-11-13 (×2): 100 mg via ORAL
  Filled 2023-11-11 (×2): qty 1

## 2023-11-11 MED ORDER — LIDOCAINE 2% (20 MG/ML) 5 ML SYRINGE
INTRAMUSCULAR | Status: DC | PRN
  Administered 2023-11-11: 60 mg via INTRAVENOUS

## 2023-11-11 MED ORDER — ACETAMINOPHEN 650 MG RE SUPP: 325.0000 mg | RECTAL | Status: DC | PRN

## 2023-11-11 MED ORDER — ROCURONIUM BROMIDE 10 MG/ML (PF) SYRINGE
PREFILLED_SYRINGE | INTRAVENOUS | Status: AC
  Filled 2023-11-11: qty 10

## 2023-11-11 MED ORDER — SODIUM CHLORIDE 0.9 % IV SOLN: INTRAVENOUS | Status: AC

## 2023-11-11 MED ORDER — CHLORHEXIDINE GLUCONATE 0.12 % MT SOLN
15.0000 mL | Freq: Once | OROMUCOSAL | Status: AC
  Administered 2023-11-11: 15 mL via OROMUCOSAL
  Filled 2023-11-11: qty 15

## 2023-11-11 MED ORDER — SUGAMMADEX SODIUM 200 MG/2ML IV SOLN
INTRAVENOUS | Status: DC | PRN
  Administered 2023-11-11: 200 mg via INTRAVENOUS

## 2023-11-11 MED ORDER — POTASSIUM CHLORIDE CRYS ER 20 MEQ PO TBCR: 20.0000 meq | EXTENDED_RELEASE_TABLET | Freq: Every day | ORAL | Status: DC | PRN

## 2023-11-11 MED ORDER — CEFAZOLIN SODIUM-DEXTROSE 2-4 GM/100ML-% IV SOLN
2.0000 g | INTRAVENOUS | Status: AC
  Administered 2023-11-11: 2 g via INTRAVENOUS
  Filled 2023-11-11: qty 100

## 2023-11-11 MED ORDER — GLYCOPYRROLATE PF 0.2 MG/ML IJ SOSY
PREFILLED_SYRINGE | INTRAMUSCULAR | Status: AC
  Filled 2023-11-11: qty 1

## 2023-11-11 SURGICAL SUPPLY — 45 items
BAG COUNTER SPONGE SURGICOUNT (BAG) ×2 IMPLANT
BAND RUBBER #18 3X1/16 STRL (MISCELLANEOUS) IMPLANT
BANDAGE ESMARK 6X9 LF (GAUZE/BANDAGES/DRESSINGS) IMPLANT
BNDG ESMARK 6X9 LF (GAUZE/BANDAGES/DRESSINGS) IMPLANT
CANISTER SUCT 3000ML PPV (MISCELLANEOUS) ×2 IMPLANT
CANNULA VESSEL 3MM 2 BLNT TIP (CANNULA) IMPLANT
CATH EMB 4FR 80 (CATHETERS) IMPLANT
CLIP LIGATING EXTRA MED SLVR (CLIP) ×2 IMPLANT
CLIP LIGATING EXTRA SM BLUE (MISCELLANEOUS) ×2 IMPLANT
DERMABOND ADVANCED .7 DNX12 (GAUZE/BANDAGES/DRESSINGS) ×2 IMPLANT
DERMABOND ADVANCED .7 DNX6 (GAUZE/BANDAGES/DRESSINGS) IMPLANT
DRAIN CHANNEL 15F RND FF W/TCR (WOUND CARE) IMPLANT
DRAPE HALF SHEET 40X57 (DRAPES) IMPLANT
DRAPE X-RAY CASS 24X20 (DRAPES) IMPLANT
ELECT REM PT RETURN 9FT ADLT (ELECTROSURGICAL) ×1 IMPLANT
ELECTRODE REM PT RTRN 9FT ADLT (ELECTROSURGICAL) ×2 IMPLANT
EVACUATOR SILICONE 100CC (DRAIN) IMPLANT
GLOVE BIO SURGEON STRL SZ7.5 (GLOVE) ×2 IMPLANT
GOWN STRL REUS W/ TWL LRG LVL3 (GOWN DISPOSABLE) ×4 IMPLANT
GOWN STRL REUS W/ TWL XL LVL3 (GOWN DISPOSABLE) ×2 IMPLANT
GRAFT PROPATEN W/RING 6X80X60 (Vascular Products) IMPLANT
GRAFT VASC PATCH XENOSURE 1X14 (Vascular Products) IMPLANT
HEMOSTAT SNOW SURGICEL 2X4 (HEMOSTASIS) IMPLANT
INSERT FOGARTY SM (MISCELLANEOUS) IMPLANT
KIT BASIN OR (CUSTOM PROCEDURE TRAY) ×2 IMPLANT
KIT TURNOVER KIT B (KITS) ×2 IMPLANT
NS IRRIG 1000ML POUR BTL (IV SOLUTION) ×4 IMPLANT
PACK PERIPHERAL VASCULAR (CUSTOM PROCEDURE TRAY) ×2 IMPLANT
PAD ARMBOARD POSITIONER FOAM (MISCELLANEOUS) ×4 IMPLANT
POWDER SURGICEL 3.0 GRAM (HEMOSTASIS) IMPLANT
STOPCOCK 4 WAY LG BORE MALE ST (IV SETS) IMPLANT
SUT ETHILON 3 0 PS 1 (SUTURE) IMPLANT
SUT MNCRL AB 4-0 PS2 18 (SUTURE) ×4 IMPLANT
SUT PROLENE 5 0 C 1 24 (SUTURE) ×2 IMPLANT
SUT PROLENE 6 0 BV (SUTURE) ×2 IMPLANT
SUT SILK 2 0 SH (SUTURE) ×2 IMPLANT
SUT SILK 3-0 18XBRD TIE 12 (SUTURE) IMPLANT
SUT VIC AB 2-0 CT1 TAPERPNT 27 (SUTURE) ×4 IMPLANT
SUT VIC AB 3-0 SH 27X BRD (SUTURE) ×4 IMPLANT
SYR 3ML LL SCALE MARK (SYRINGE) IMPLANT
TOWEL GREEN STERILE (TOWEL DISPOSABLE) ×4 IMPLANT
TOWEL GREEN STERILE FF (TOWEL DISPOSABLE) ×2 IMPLANT
TRAY FOLEY MTR SLVR 16FR STAT (SET/KITS/TRAYS/PACK) ×2 IMPLANT
UNDERPAD 30X36 HEAVY ABSORB (UNDERPADS AND DIAPERS) ×2 IMPLANT
WATER STERILE IRR 1000ML POUR (IV SOLUTION) ×2 IMPLANT

## 2023-11-11 NOTE — Transfer of Care (Signed)
 Immediate Anesthesia Transfer of Care Note  Patient: Donna Garza  Procedure(s) Performed: LEFT FEMORAL ENDARTERECTOMY WITH XENSURE BIOLOGIC PATCH (Left: Leg Upper) FEMORAL-POPLITEAL ARTERY BYPASS WITH GORE PROPATEN VASCULAR GRAFT (Left: Leg Upper)  Patient Location: PACU  Anesthesia Type:General  Level of Consciousness: drowsy  Airway & Oxygen Therapy: Patient Spontanous Breathing and Patient connected to face mask oxygen  Post-op Assessment: Report given to RN and Post -op Vital signs reviewed and stable  Post vital signs: Reviewed and stable  Last Vitals:  Vitals Value Taken Time  BP 107/52 11/11/23 1057  Temp    Pulse 58 11/11/23 1059  Resp 14 11/11/23 1059  SpO2 96 % 11/11/23 1059  Vitals shown include unfiled device data.  Last Pain:  Vitals:   11/11/23 4259  TempSrc:   PainSc: 0-No pain         Complications: No notable events documented.

## 2023-11-11 NOTE — Anesthesia Procedure Notes (Signed)
 Procedure Name: Intubation Date/Time: 11/11/2023 7:55 AM  Performed by: Georgianne Fick D, CRNAPre-anesthesia Checklist: Patient identified, Emergency Drugs available, Suction available and Patient being monitored Patient Re-evaluated:Patient Re-evaluated prior to induction Oxygen Delivery Method: Circle System Utilized Preoxygenation: Pre-oxygenation with 100% oxygen Induction Type: IV induction Ventilation: Mask ventilation without difficulty Laryngoscope Size: Mac and 3 Grade View: Grade I Tube type: Oral Tube size: 7.0 mm Number of attempts: 1 Airway Equipment and Method: Stylet and Oral airway Placement Confirmation: ETT inserted through vocal cords under direct vision, positive ETCO2 and breath sounds checked- equal and bilateral Secured at: 21 cm Tube secured with: Tape Dental Injury: Teeth and Oropharynx as per pre-operative assessment

## 2023-11-11 NOTE — Progress Notes (Signed)
Patient brought to 4E from PACU. VSS. Telemetry box applied, CCMD notified. Patient oriented to room and staff. Call bell in reach.   Hannah Crill L Zavannah Deblois, RN  

## 2023-11-11 NOTE — Anesthesia Procedure Notes (Signed)
 Arterial Line Insertion Start/End4/03/2024 7:00 AM Performed by: Val Eagle, MD, Noah Delaine, CRNA, CRNA  Patient location: Pre-op. Preanesthetic checklist: patient identified, IV checked, site marked, risks and benefits discussed, surgical consent, monitors and equipment checked, pre-op evaluation, timeout performed and anesthesia consent Lidocaine 1% used for infiltration radial was placed Catheter size: 20 G Hand hygiene performed  and maximum sterile barriers used   Attempts: 1 Procedure performed without using ultrasound guided technique. Following insertion, dressing applied. Post procedure assessment: normal and unchanged  Patient tolerated the procedure well with no immediate complications.

## 2023-11-11 NOTE — Op Note (Signed)
 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 Randie Heinz, MD Assistant: Clinton Gallant, PA Procedure Performed: 1.  Left common femoral, profundofemoral, superficial femoral and external iliac artery endarterectomy with bovine pericardial patch angioplasty 2.  Left common femoral to above-knee popliteal artery bypass with 6 mm ringed PTFE  Indications: 83 year old female with history of right lower extremity bypass pain for rest pain and very short distance claudication left lower extremity found to have severe stenosis and calcifications of the left common femoral artery with occlusion of her superficial femoral artery and reconstitution above the knee with runoff via both the anterior tibial and peroneal arteries.  She is now indicated for the above-noted procedure.  Experience assistant was necessary to facilitate exposure of the common femoral artery as well as above-knee popliteal artery as well as performing extensive endarterectomy with bovine pericardial patch angioplasty and bypass graft from the common femoral proximal to the above-knee popliteal artery.  Findings: Common femoral artery was heavily calcified and minimally pulsatile.  We performed extensive endarterectomy with a very good endpoint as the inflow as well as a good endpoint of the outflow of the SFA and there was a direct endpoint ostium of the profunda.  After bovine pericardial patch angioplasty there is a very strong signal in the profunda and a bypass graft was then sewn into the above-knee popliteal artery where a similar endarterectomy was performed and a completion there was a very strong signal at the anterior tibial artery that could be traced onto the dorsalis pedis and was grafted pended.   Procedure:  The patient was identified in the  holding area and taken to the operating room where she is placed supine open table and the left lower extremity was sterilely prepped and draped in the usual fashion.  Antibiotics were administered and timeout was called.  We began using ultrasound to identify the left common femoral artery and this was marked on the skin.  We evaluated for great saphenous vein and this was quite diminutive.  Without a vertical incision was created in the groin we dissected down reflected the inguinal ligament cephalad and identified the crossing vein of the external iliac artery and divided this between ties.  We encircled the side branches of the common femoral and external iliac arteries with Vesseloops and then the external iliac artery itself was encircled pliable to the inguinal ligament.  We dissected down to the profunda and encircled this as well as the SFA.  We turned our attention distally and made a vertical incision just above the knee and dissected down to the popliteal artery where this was softer as we were closer to the knee and encircled this with Vesseloops as well.  Doppler was used and confirmed very minimal monophasic flow in the popliteal artery above the knee.  We then tunneled a 6 mm ringed PTFE graft anatomically and the patient was fully heparinized.  We clamped the outflow followed by the inflow of the external iliac artery and then we opened this longitudinally.  I then passed the 4 Fogarty up to approximately 12 cm and inflated this and locked it with a stopcock.  We performed extensive endarterectomy including of the inflow and the outflow vessels and ultimately I removed the balloon down and reclamped the external iliac artery and completed the endarterectomy site.  Given the length of the long  bovine pericardial patch was used and this was sewn in place with 5-0 Prolene sutures.  Prior to completion without flushing all directions.  Upon completion there is very strong pulsatility where previously  had very minimal pulse in the common femoral.  There was a very strong profunda flow as well.  The common femoral was reclamped and then opened at the patch and the graft was spatulated and sewn end-to-side with 6-0 Prolene suture.  Prior to completion we flushed through the graft and then completed the anastomosis.  We turned our attention then distally.  The popliteal clamped proximally and distally and opened where there was significant calcifications and an endarterectomy was performed which had a smooth tapering distally.  Leg was straight and the graft was trimmed to size and spatulated and sewn end-to-side with 6-0 Prolene suture.  Again prior to the completion of the anastomosis we performed flushing maneuvers in all directions.  Upon completion there was a very strong pulsatility in the graft and we confirmed Doppler distally in the popliteal artery as well as the anterior tibial artery that was graft dependent.  The wounds were then irrigated and hemostasis was obtained and 50 mg of protamine was administered.  The patient was then awakened from anesthesia having tolerated the procedure well immediate complication.  All counts were correct at completion.   EBL: 500cc  [name] C. Randie Heinz, MD [facility] Office: [phone] Pager: [phone]
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
## 2023-11-12 ENCOUNTER — Encounter (HOSPITAL_COMMUNITY): Payer: Self-pay | Admitting: Vascular Surgery

## 2023-11-12 LAB — CBC
HCT: 25.8 % — ABNORMAL LOW (ref 36.0–46.0)
Hemoglobin: 8.7 g/dL — ABNORMAL LOW (ref 12.0–15.0)
MCH: 27.3 pg (ref 26.0–34.0)
MCHC: 33.7 g/dL (ref 30.0–36.0)
MCV: 80.9 fL (ref 80.0–100.0)
Platelets: 180 10*3/uL (ref 150–400)
RBC: 3.19 MIL/uL — ABNORMAL LOW (ref 3.87–5.11)
RDW: 14.5 % (ref 11.5–15.5)
WBC: 12.8 10*3/uL — ABNORMAL HIGH (ref 4.0–10.5)
nRBC: 0 % (ref 0.0–0.2)

## 2023-11-12 LAB — LIPID PANEL
Cholesterol: 128 mg/dL (ref 0–200)
HDL: 33 mg/dL — ABNORMAL LOW (ref >40)
LDL Cholesterol: 72 mg/dL (ref 0–99)
Total CHOL/HDL Ratio: 3.9 ratio
Triglycerides: 116 mg/dL (ref <150)
VLDL: 23 mg/dL (ref 0–40)

## 2023-11-12 LAB — BASIC METABOLIC PANEL WITH GFR
Anion gap: 10 (ref 5–15)
BUN: 18 mg/dL (ref 8–23)
CO2: 22 mmol/L (ref 22–32)
Calcium: 8.5 mg/dL — ABNORMAL LOW (ref 8.9–10.3)
Chloride: 104 mmol/L (ref 98–111)
Creatinine, Ser: 1.09 mg/dL — ABNORMAL HIGH (ref 0.44–1.00)
GFR, Estimated: 51 mL/min — ABNORMAL LOW (ref >60)
Glucose, Bld: 174 mg/dL — ABNORMAL HIGH (ref 70–99)
Potassium: 3.9 mmol/L (ref 3.5–5.1)
Sodium: 136 mmol/L (ref 135–145)

## 2023-11-12 LAB — GLUCOSE, CAPILLARY
Glucose-Capillary: 137 mg/dL — ABNORMAL HIGH (ref 70–99)
Glucose-Capillary: 105 mg/dL — ABNORMAL HIGH (ref 70–99)
Glucose-Capillary: 129 mg/dL — ABNORMAL HIGH (ref 70–99)

## 2023-11-12 MED ORDER — ROSUVASTATIN CALCIUM 20 MG PO TABS
20.0000 mg | ORAL_TABLET | Freq: Every day | ORAL | Status: DC
  Administered 2023-11-13: 20 mg via ORAL
  Filled 2023-11-12: qty 1

## 2023-11-12 NOTE — Progress Notes (Addendum)
  Progress Note    11/12/2023 6:47 AM 1 Day Post-Op  Subjective:  no complaints; says her foot feels better.  Has not been out of bed yet.   Afebrile HR 50's-90's NSR 110's-140's systolic 95% RA  Vitals:   11/12/23 0000 11/12/23 0327  BP: (!) 122/56 (!) 125/56  Pulse: 60 72  Resp: 16 15  Temp:  97.6 F (36.4 C)  SpO2: 99% 96%    Physical Exam: General:  no distress Cardiac:  regular Lungs:  non labored Incisions:  left groin and left above knee incisions look good with mild ecchymosis Extremities:  palpable left DP and brisk left DP/pero and monophasic PT.  Brisk right DP > pero/PT; left calf is soft and non tender.  Abdomen:  soft  CBC    Component Value Date/Time   WBC 12.8 (H) 11/12/2023 0335   RBC 3.19 (L) 11/12/2023 0335   HGB 8.7 (L) 11/12/2023 0335   HCT 25.8 (L) 11/12/2023 0335   PLT 180 11/12/2023 0335   MCV 80.9 11/12/2023 0335   MCH 27.3 11/12/2023 0335   MCHC 33.7 11/12/2023 0335   RDW 14.5 11/12/2023 0335    BMET    Component Value Date/Time   NA 136 11/12/2023 0335   K 3.9 11/12/2023 0335   CL 104 11/12/2023 0335   CO2 22 11/12/2023 0335   GLUCOSE 174 (H) 11/12/2023 0335   BUN 18 11/12/2023 0335   CREATININE 1.09 (H) 11/12/2023 0335   CALCIUM 8.5 (L) 11/12/2023 0335   GFRNONAA 51 (L) 11/12/2023 0335   GFRAA 71 (L) 02/02/2013 0936    INR    Component Value Date/Time   INR 1.0 10/31/2023 0920     Intake/Output Summary (Last 24 hours) at 11/12/2023 0647 Last data filed at 11/11/2023 2124 Gross per 24 hour  Intake 1786.24 ml  Output 1575 ml  Net 211.24 ml      Assessment/Plan:  83 y.o. female is s/p:  Left common femoral, profundofemoral, superficial femoral and external iliac artery endarterectomy with bovine pericardial patch angioplasty and left CFA to AK bypass with PTFE on 11/12/2023 by Dr. Randie Heinz.  She has hx of right leg bypass in 2013 by Dr. Hart Rochester 1 Day Post-Op   -pt with palpable left DP pulse and subjectively, left foot  feels better. Incisions look fine with mild ecchymosis.  Left calf soft and non tender -acute surgical blood loss anemia-BP tolerating.   -discussed groin wound care with pt and to keep gauze in crease to help prevent wound infection.  -increase mobilization today -DVT prophylaxis:  sq heparin -continue asa/statin   Doreatha Massed, PA-C [facility] [phone] 11/12/2023 6:47 AM  I have independently interviewed and examined patient and agree with PA assessment and plan above.  Bilateral dorsalis pedis pulses are palpable and left foot is very warm.  Mobilize and pain control today.  Eligio Angert C. Randie Heinz, MD [facility] [place] Office: [phone] Pager: [phone]

## 2023-11-12 NOTE — Plan of Care (Signed)

## 2023-11-12 NOTE — Evaluation (Signed)
 Occupational Therapy Evaluation and Discharge Patient Details Name: Donna Garza MRN: [id number] DOB: Jan 30, 1941 Today's Date: 11/12/2023   History of Present Illness   83 y.o. female adm 11/11/23 with  life limiting LLE claudication now with rest pain. 4/08/ LLE endarterectomy with patch angioplasty, L fem-pop BPG  PMH-CKD, HTN, PVD, DM     Clinical Impressions Pt and daughter educated in availability and use of AE for LB bathing and dressing should pt need when daughter returns [place] her [place]. No further OT needs.      If plan is discharge home, recommend the following:   Assist for transportation;A little help with bathing/dressing/bathroom     Functional Status Assessment   Patient has had a recent decline in their functional status and demonstrates the ability to make significant improvements in function in a reasonable and predictable amount of time.     Equipment Recommendations   None recommended by OT     Recommendations for Other Services         Precautions/Restrictions   Precautions Precautions: Other (comment) Recall of Precautions/Restrictions: Intact Precaution/Restrictions Comments: gauze tucked in left groin; no tape per MD Restrictions Weight Bearing Restrictions Per Provider Order: No     Mobility Bed Mobility Overal bed mobility: Independent                  Transfers Overall transfer level: Independent Equipment used: None                      Balance                                           ADL either performed or assessed with clinical judgement   ADL Overall ADL's : Needs assistance/impaired Eating/Feeding: Independent   Grooming: Supervision/safety;Independent   Upper Body Bathing: Standing;Independent   Lower Body Bathing: Minimal assistance;Sit to/from stand   Upper Body Dressing : Sitting;Independent   Lower Body Dressing: Minimal assistance;Sit to/from stand    Toilet Transfer: Ambulation;Independent           Functional mobility during ADLs: Independent General ADL Comments: Educated in use of AE and recommended dressin operated LE first.     Vision Ability to See in Adequate Light: 0 Adequate Patient Visual Report: No change from baseline       Perception         Praxis         Pertinent Vitals/Pain Pain Assessment Pain Assessment: No/denies pain     Extremity/Trunk Assessment Upper Extremity Assessment Upper Extremity Assessment: Overall WFL for tasks assessed       Cervical / Trunk Assessment Cervical / Trunk Assessment: Normal   Communication Communication Communication: No apparent difficulties   Cognition Arousal: Alert Behavior During Therapy: WFL for tasks assessed/performed Cognition: No apparent impairments                               Following commands: Intact       Cueing  General Comments   Cueing Techniques: Verbal cues      Exercises     Shoulder Instructions      Home Living Family/patient expects to be discharged to:: Private residence Living Arrangements: Alone Available Help at Discharge: Family;Available PRN/intermittently Type of Home: House Home Access: Stairs to enter Entrance  Stairs-Number of Steps: 5 Entrance Stairs-Rails: Left Home Layout: One level     Bathroom Shower/Tub: Chief Strategy Officer: Standard     Home Equipment: Grab bars - tub/shower          Prior Functioning/Environment Prior Level of Function : Independent/Modified Independent;Driving                    OT Problem List:     OT Treatment/Interventions:        OT Goals(Current goals can be found in the care plan section)       OT Frequency:       Co-evaluation              AM-PAC OT "6 Clicks" Daily Activity     Outcome Measure Help from another person eating meals?: None Help from another person taking care of personal grooming?:  None Help from another person toileting, which includes using toliet, bedpan, or urinal?: None Help from another person bathing (including washing, rinsing, drying)?: A Little Help from another person to put on and taking off regular upper body clothing?: None Help from another person to put on and taking off regular lower body clothing?: A Little 6 Click Score: 22   End of Session    Activity Tolerance: Patient tolerated treatment well Patient left: in bed;with call bell/phone within reach;with family/visitor present  OT Visit Diagnosis: Muscle weakness (generalized) (M62.81)                Time: 1610-9604 OT Time Calculation (min): 15 min Charges:  OT General Charges $OT Visit: 1 Visit OT Evaluation $OT Eval Low Complexity: 1 Low  Berna Spare, OTR/L Acute Rehabilitation Services Office: [phone]  Evern Bio 11/12/2023, 3:44 PM

## 2023-11-12 NOTE — Progress Notes (Signed)
 PHARMACIST LIPID MONITORING   Donna Garza is a 83 y.o. female admitted on 11/11/2023 with atherosclerosis native arteries left lower extremity .  Pharmacy has been consulted to optimize lipid-lowering therapy with the indication of secondary prevention for clinical ASCVD.  Recent Labs:  Lipid Panel (last 6 months):   Lab Results  Component Value Date   CHOL 128 11/12/2023   TRIG 116 11/12/2023   HDL 33 (L) 11/12/2023   CHOLHDL 3.9 11/12/2023   VLDL 23 11/12/2023   LDLCALC 72 11/12/2023    Hepatic function panel (last 6 months):   Lab Results  Component Value Date   AST 20 10/31/2023   ALT 19 10/31/2023   ALKPHOS 90 10/31/2023   BILITOT 0.5 10/31/2023    SCr (since admission):   Serum creatinine: 1.09 mg/dL (H) 16/10/96 0454 Estimated creatinine clearance: 42 mL/min (A)  Current therapy and lipid therapy tolerance Current lipid-lowering therapy: rosuvastatin 10mg   Previous lipid-lowering therapies (if applicable): n/a Documented or reported allergies or intolerances to lipid-lowering therapies (if applicable): n/a  Assessment:   Per patient, she had previously been on rosuvastatin 5mg  PO daily for quite some time. Recently in March, she was increased to 10mg  PO daily by her cardiologist. We discussed her risk factors and overall goal range for her LDL. With shared decision making, she was agreeable to increase the dose to 20mg  daily.  Patient agrees with changes to lipid-lowering therapy  Plan:    1.Statin intensity (high intensity recommended for all patients regardless of the LDL):  Add or increase statin to high intensity.  > increase rosuvastatin from 10mg  > 20mg  daily  2.Add ezetimibe (if any one of the following):   Not indicated at this time.  3.Refer to lipid clinic:   No  4.Follow-up with:  Cardiology provider - Thurmon Fair, MD  5.Follow-up labs after discharge:  Changes in lipid therapy were made. Check a lipid panel in 8-12 weeks then annually.       Thank you for allowing pharmacy to be a part of this patient's care.  Thelma Barge, PharmD, BCPS Clinical Pharmacist

## 2023-11-12 NOTE — Evaluation (Signed)
 Physical Therapy Evaluation and Discharge Patient Details Name: Donna Garza MRN: [id number] DOB: 1940/10/19 Today's Date: 11/12/2023  History of Present Illness  83 y.o. female adm 11/11/23 with  life limiting LLE claudication now with rest pain. 4/08/ LLE endarterectomy with patch angioplasty, L fem-pop BPG  PMH-CKD, HTN, PVD, DM  Clinical Impression  Patient evaluated by Physical Therapy with no further acute PT needs identified.  PT is signing off. Thank you for this referral.         If plan is discharge home, recommend the following:     Can travel by private vehicle        Equipment Recommendations None recommended by PT  Recommendations for Other Services  OT consult    Functional Status Assessment Patient has had a recent decline in their functional status and demonstrates the ability to make significant improvements in function in a reasonable and predictable amount of time.     Precautions / Restrictions Precautions Precautions: Other (comment) Recall of Precautions/Restrictions: Intact Precaution/Restrictions Comments: gauze tucked in left groin; no tape per MD      Mobility  Bed Mobility Overal bed mobility: Independent                  Transfers Overall transfer level: Independent Equipment used: None                    Ambulation/Gait Ambulation/Gait assistance: Supervision Gait Distance (Feet): 140 Feet Assistive device: Rolling walker (2 wheels), None Gait Pattern/deviations: Step-through pattern, Decreased stride length   Gait velocity interpretation: >2.62 ft/sec, indicative of community ambulatory   General Gait Details: initial 15 ft with RW and then no device. Slightly guarded but denied pain  Stairs            Wheelchair Mobility     Tilt Bed    Modified Rankin (Stroke Patients Only)       Balance Overall balance assessment: Independent                                            Pertinent Vitals/Pain Pain Assessment Pain Assessment: No/denies pain (throughout session)    Home Living Family/patient expects to be discharged to:: Private residence Living Arrangements: Alone Available Help at Discharge: Family;Available PRN/intermittently Type of Home: House Home Access: Stairs to enter Entrance Stairs-Rails: Left Entrance Stairs-Number of Steps: 5   Home Layout: One level Home Equipment: None      Prior Function Prior Level of Function : Independent/Modified Independent;Driving                     Extremity/Trunk Assessment   Upper Extremity Assessment Upper Extremity Assessment: Defer to OT evaluation    Lower Extremity Assessment Lower Extremity Assessment: LLE deficits/detail LLE Deficits / Details: AROM WFL; +incisions with light drainage on gauze from left groin (replaced after walking)    Cervical / Trunk Assessment Cervical / Trunk Assessment: Normal  Communication   Communication Communication: No apparent difficulties    Cognition Arousal: Alert Behavior During Therapy: WFL for tasks assessed/performed   PT - Cognitive impairments: No apparent impairments                         Following commands: Intact       Cueing Cueing Techniques: Verbal cues     General Comments  Exercises     Assessment/Plan    PT Assessment Patient does not need any further PT services  PT Problem List         PT Treatment Interventions      PT Goals (Current goals can be found in the Care Plan section)  Acute Rehab PT Goals Patient Stated Goal: go home tomorrow PT Goal Formulation: All assessment and education complete, DC therapy    Frequency       Co-evaluation               AM-PAC PT "6 Clicks" Mobility  Outcome Measure Help needed turning from your back to your side while in a flat bed without using bedrails?: None Help needed moving from lying on your back to sitting on the side of a flat  bed without using bedrails?: None Help needed moving to and from a bed to a chair (including a wheelchair)?: None Help needed standing up from a chair using your arms (e.g., wheelchair or bedside chair)?: None Help needed to walk in hospital room?: A Little Help needed climbing 3-5 steps with a railing? : A Little 6 Click Score: 22    End of Session Equipment Utilized During Treatment: Gait belt Activity Tolerance: Patient tolerated treatment well Patient left: in chair;with call bell/phone within reach;with chair alarm set Nurse Communication: Mobility status PT Visit Diagnosis: Difficulty in walking, not elsewhere classified (R26.2)    Time: 4098-1191 PT Time Calculation (min) (ACUTE ONLY): 19 min   Charges:   PT Evaluation $PT Eval Low Complexity: 1 Low   PT General Charges $$ ACUTE PT VISIT: 1 Visit          Jerolyn Center, PT Acute Rehabilitation Services  Office [phone]   Zena Amos 11/12/2023, 11:33 AM

## 2023-11-13 LAB — GLUCOSE, CAPILLARY: Glucose-Capillary: 113 mg/dL — ABNORMAL HIGH (ref 70–99)

## 2023-11-13 MED ORDER — ROSUVASTATIN CALCIUM 10 MG PO TABS: 20.0000 mg | ORAL_TABLET | Freq: Every day | ORAL | 3 refills | Status: DC

## 2023-11-13 MED ORDER — OXYCODONE-ACETAMINOPHEN 5-325 MG PO TABS: 1.0000 | ORAL_TABLET | Freq: Four times a day (QID) | ORAL | 0 refills | Status: DC | PRN

## 2023-11-13 NOTE — Progress Notes (Addendum)
  Progress Note    11/13/2023 6:41 AM 2 Days Post-Op  Subjective:  says she is ready to go home.   Afebrile HR 60's-80's NSR 140's-150's systolic 95% RA  Vitals:   11/13/23 0258 11/13/23 0400  BP: (!) 150/75 (!) 145/53  Pulse: 69 66  Resp: 17 16  Temp: 97.7 F (36.5 C)   SpO2: 96% 96%    Physical Exam: General:  no distress Cardiac:  regular Lungs:  non labored Incisions:  left groin and left AK incisions look good  Extremities:  palpable left DP pulse.  Compartments remain soft Abdomen:  soft  CBC    Component Value Date/Time   WBC 12.8 (H) 11/12/2023 0335   RBC 3.19 (L) 11/12/2023 0335   HGB 8.7 (L) 11/12/2023 0335   HCT 25.8 (L) 11/12/2023 0335   PLT 180 11/12/2023 0335   MCV 80.9 11/12/2023 0335   MCH 27.3 11/12/2023 0335   MCHC 33.7 11/12/2023 0335   RDW 14.5 11/12/2023 0335    BMET    Component Value Date/Time   NA 136 11/12/2023 0335   K 3.9 11/12/2023 0335   CL 104 11/12/2023 0335   CO2 22 11/12/2023 0335   GLUCOSE 174 (H) 11/12/2023 0335   BUN 18 11/12/2023 0335   CREATININE 1.09 (H) 11/12/2023 0335   CALCIUM 8.5 (L) 11/12/2023 0335   GFRNONAA 51 (L) 11/12/2023 0335   GFRAA 71 (L) 02/02/2013 0936    INR    Component Value Date/Time   INR 1.0 10/31/2023 0920     Intake/Output Summary (Last 24 hours) at 11/13/2023 0641 Last data filed at 11/12/2023 1136 Gross per 24 hour  Intake --  Output 650 ml  Net -650 ml      Assessment/Plan:  83 y.o. female is s/p:  Left common femoral, profundofemoral, superficial femoral and external iliac artery endarterectomy with bovine pericardial patch angioplasty and left CFA to AK bypass with PTFE on 11/12/2023 by Dr. Randie Heinz.  She has hx of right leg bypass in 2013 by Dr. Hart Rochester   2 Days Post-Op   -pt with palpable left DP pulse and incisions look good.  -ambulated with PT yesterday and no follow up recommended.   -DVT prophylaxis:  sq heparin -discharge home today and f/u in 2-3 weeks to check  incisions. -again reiterated left groin wound care to help prevent wound infection.   -continue asa/statin   Doreatha Massed, PA-C [facility] [phone] 11/13/2023 6:41 AM  I have independently interviewed and examined patient and agree with PA assessment and plan above. Ok for [employer] today.   Libi Corso C. Randie Heinz, MD [facility] [place] Office: [phone] Pager: [phone]

## 2023-11-13 NOTE — Progress Notes (Addendum)
   11/13/23 0955  TOC Brief Assessment  Insurance and Status Reviewed  Patient has primary care physician Yes  Home environment has been reviewed home  Prior level of function: self  Prior/Current Home Services No current home services  Social Drivers of Health Review SDOH reviewed no interventions necessary  Readmission risk has been reviewed Yes  Transition of care needs no transition of care needs at this time    Pt stable for transition home today, No HH or DME needs noted per PT/OT evals. Pt asking about [facility] for home- she will need to see about purchasing out of pocket as insurance will not cover at this time with no recommendations for need. [facility] office had made referral to [facility] for [facility] needs- liaison updated no HH needs for discharge. Family to transport home.

## 2023-11-13 NOTE — Discharge Instructions (Signed)

## 2023-11-13 NOTE — Discharge Summary (Signed)
 Discharge Summary     Donna Garza 1941/05/27 83 y.o. 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 female previously has undergone right common femoral to popliteal artery bypass graft with vein for claudication. At that time she also demonstrated symptoms of left lower extremity claudication without any flow-limiting stenosis throughout the left lower extremity. She did very well from right lower extremity bypass although did require subsequent balloon angioplasty now is without symptoms other than scant edema of the right lower extremity. On the left side she now has pain with very minimal walking and has even had pain in the melanite which has awoken her from sleep. She is now here with repeat ABIs and left lower extremity duplex to evaluate for worsening symptoms of the left leg. She denies tissue loss or ulceration. She remains on aspirin and a statin.   Hospital Course:  The patient was admitted to the hospital and taken to the operating room on 11/11/2023 and underwent: 1.  Left common femoral, profundofemoral, superficial femoral and external iliac artery endarterectomy with bovine pericardial patch angioplasty 2.  Left common femoral to above-knee popliteal artery bypass with 6 mm ringed PTFE    Findings: Common femoral artery was heavily calcified and minimally pulsatile. We performed extensive endarterectomy with a very good endpoint as the inflow as well as a good endpoint of the outflow of the SFA and there was a direct endpoint ostium of the profunda. After bovine pericardial patch angioplasty there is a very strong signal in the profunda and a bypass  graft was then sewn into the above-knee popliteal artery where a similar endarterectomy was performed and a completion there was a very strong signal at the anterior tibial artery that could be traced onto the dorsalis pedis and was grafted pended.   The pt tolerated the procedure well and was transported to the PACU in good condition.   By POD 1, pt doing well with palpable left DP pulse.  Compartments are soft.  She worked with PT and did well without any follow up recommendations.  POD 2, pt did well and is ready for discharge.  Groin wound care reiterated throughout post operative course.   She is discharged home.   CBC    Component Value Date/Time   WBC 12.8 (H) 11/12/2023 0335   RBC 3.19 (L) 11/12/2023 0335   HGB 8.7 (L) 11/12/2023 0335   HCT 25.8 (L) 11/12/2023 0335   PLT 180 11/12/2023 0335   MCV 80.9 11/12/2023 0335   MCH 27.3 11/12/2023 0335   MCHC 33.7 11/12/2023 0335   RDW 14.5 11/12/2023 0335    BMET    Component Value Date/Time   NA 136 11/12/2023 0335   K 3.9 11/12/2023 0335   CL 104 11/12/2023 0335   CO2 22 11/12/2023 0335   GLUCOSE 174 (H) 11/12/2023 0335   BUN 18 11/12/2023 0335   CREATININE 1.09 (H) 11/12/2023 0335   CALCIUM 8.5 (L) 11/12/2023 0335   GFRNONAA 51 (L) 11/12/2023 0335   GFRAA 71 (L) 02/02/2013 0936     Discharge Instructions     Discharge patient   Complete by: As directed    Discharge pt once she has been seen by Dr.  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 (HCC) 11/11/2023   H/O adenomatous polyp of colon  08/24/2021   Aftercare following surgery of the circulatory system, NEC 12/30/2013   Peripheral vascular disease, unspecified (HCC) 03/02/2013   Macular pucker, right eye 01/15/2013   Atherosclerosis of native artery of extremity with intermittent claudication (HCC) 11/24/2012   PVD (peripheral vascular disease) (HCC) 08/04/2012   PVD (peripheral vascular disease) with claudication (HCC) 04/28/2012   PAD (peripheral artery disease) (HCC) 03/23/2012   GERD (gastroesophageal reflux disease) 11/26/2010   Adenomatous colon polyp 11/26/2010   Past Medical History:  Diagnosis Date   Anemia    hx of during pregnancy   CKD (chronic kidney disease)    GERD (gastroesophageal reflux disease)    Heart murmur    evaluated by Dr. Royann Shivers   Hiatal hernia 2012   HTN (hypertension)    Hx of adenomatous colonic polyps    Last colonoscopy 2007. Benign polyp at that time. Tubular adenoma found at colonoscopy in 2004.   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   PAD (peripheral artery disease) (HCC)    >10 years   Peripheral vascular disease (HCC)    Schatzki's ring 2012   Tubular adenoma 2012   Type 2 diabetes mellitus (HCC)      Allergies as of 11/13/2023       Reactions   Diphenhydramine Other (See Comments)   "knocks me Completely out" Makes pt go to sleep.        Medication List     STOP taking these medications    olmesartan 40 MG tablet Commonly known as: BENICAR       TAKE these medications    amLODipine 2.5 MG tablet Commonly known as: NORVASC Take 1 tablet (2.5 mg total) by mouth daily.   aspirin 81 MG tablet Take 81 mg by mouth daily.   CoQ10 100 MG Caps Take 100 mg by mouth daily.   dorzolamide 2 % ophthalmic solution Commonly known as: TRUSOPT Place 1 drop into the right eye 2 (two) times daily.   hydrochlorothiazide 25 MG tablet Commonly known as: HYDRODIURIL Take 25 mg by mouth daily.   loratadine 10 MG tablet Commonly known as: CLARITIN Take 10 mg by mouth daily.    losartan 100 MG tablet Commonly known as: COZAAR Take 100 mg by mouth daily.   omeprazole 20 MG capsule Commonly known as: PRILOSEC Take 20 mg by mouth daily.   oxyCODONE-acetaminophen 5-325 MG tablet Commonly known as: Percocet Take 1 tablet by mouth every 6 (six) hours as needed for severe pain (pain score 7-10).   rosuvastatin 10 MG tablet Commonly known as: CRESTOR Take 1 tablet (10 mg total) by mouth daily.        Discharge Instructions: [facility] Discharge instructions Lower Extremity Bypass Surgery  Please refer to the following instruction for your post-procedure care. Your surgeon or physician assistant will discuss any changes with you.  Activity  You are encouraged to walk as much as you can. You can slowly return to normal activities during the month after your  surgery. Avoid strenuous activity and heavy lifting until your doctor tells you it's OK. Avoid activities such as vacuuming or swinging a golf club. Do not drive until your doctor give the OK and you are no longer taking prescription pain medications. It is also normal to have difficulty with sleep habits, eating and bowel movement after surgery. These will go away with time.  Bathing/Showering  You may shower after you go home. Do not soak in a bathtub, hot tub, or swim until the incision heals completely.  Incision Care  Clean your incision with mild soap and water. Shower every day. Pat the area dry with a clean towel. You do not need a bandage unless otherwise instructed. Do not apply any ointments or creams to your incision. If you have open wounds you will be instructed how to care for them or a visiting nurse may be arranged for you. If you have staples or sutures along your incision they will be removed at your post-op appointment. You may have skin glue on your incision. Do not peel it off. It will come off on its own in about one week.  Wash the groin wound with  soap and water daily and pat dry. (No tub bath-only shower)  Then put a dry gauze or washcloth in the groin to keep this area dry to help prevent wound infection.  Do this daily and as needed.  Do not use Vaseline or neosporin on your incisions.  Only use soap and water on your incisions and then protect and keep dry.  Diet  Resume your normal diet. There are no special food restrictions following this procedure. A low fat/ low cholesterol diet is recommended for all patients with vascular disease. In order to heal from your surgery, it is CRITICAL to get adequate nutrition. Your body requires vitamins, minerals, and protein. Vegetables are the best source of vitamins and minerals. Vegetables also provide the perfect balance of protein. Processed food has little nutritional value, so try to avoid this.  Medications  Resume taking all your medications unless your doctor or Physician Assistant tells you not to. If your incision is causing pain, you may take over-the-counter pain relievers such as acetaminophen (Tylenol). If you were prescribed a stronger pain medication, please aware these medication can cause nausea and constipation. Prevent nausea by taking the medication with a snack or meal. Avoid constipation by drinking plenty of fluids and eating foods with high amount of fiber, such as fruits, vegetables, and grains. Take Colace 100 mg (an over-the-counter stool softener) twice a day as needed for constipation.  Do not take Tylenol if you are taking prescription pain medications.  Follow Up  Our office will schedule a follow up appointment 2-3 weeks following discharge.  Please call us immediately for any of the following conditions  Severe or worsening pain in your legs or feet while at rest or while walking Increase pain, redness, warmth, or drainage (pus) from your incision site(s) Fever of 101 degree or higher The swelling in your leg with the bypass suddenly worsens and becomes more  painful than when you were in the hospital If you have been instructed to feel your graft pulse then you should do so every day. If you can no longer feel this pulse, call the office immediately. Not all patients are given this instruction.  Leg swelling is common after leg bypass surgery.  The swelling should improve over a few months following surgery. To improve the swelling, you may elevate  your legs above the level of your heart while you are sitting or resting. Your surgeon or physician assistant may ask you to apply an ACE wrap or wear compression (TED) stockings to help to reduce swelling.  Reduce your risk of vascular disease  Stop smoking. If you would like help call [facility] at 1-800-QUIT-NOW (908-748-6970) or [place] at 856-283-9267.  Manage your cholesterol Maintain a desired weight Control your diabetes weight Control your diabetes Keep your blood pressure down  If you have any questions, please call the office at [phone]   Prescriptions given: 1.  Roxicet #12 No Refill  Disposition: home  Patient's condition: is Good  Follow up: 1. [facility] in 2-3 weeks   Doreatha Massed, PA-C [facility] [phone] 11/13/2023  7:32 AM  - For VQI Registry use ---   Post-op:  Wound infection: No  Graft infection: No  Transfusion: No    If yes, n/a units given New Arrhythmia: No Ipsilateral amputation: No, [ ]  Minor, [ ]  BKA, [ ]  AKA Discharge patency: [x ] Primary, [ ]  Primary assisted, [ ]  Secondary, [ ]  Occluded Patency judged by: [ ]  Dopper only, [ ]  Palpable graft pulse, [x]  Palpable distal pulse, [ ]  ABI inc. > 0.15, [ ]  Duplex Discharge ABI: R not done, L  D/C Ambulatory Status: Ambulatory  Complications: MI: No, [ ]  Troponin only, [ ]  EKG or Clinical CHF: No Resp failure:No, [ ]  Pneumonia, [ ]  Ventilator Chg in renal function: No, [ ]  Inc. Cr > 0.5, [ ]  Temp. Dialysis,  [ ]  Permanent dialysis Stroke: No, [ ]  Minor, [ ]   Major Return to OR: No  Reason for return to OR: [ ]  Bleeding, [ ]  Infection, [ ]  Thrombosis, [ ]  Revision  Discharge medications: Statin use:  yes ASA use:  yes Plavix use:  no Beta blocker use: no CCB use:  Yes ACEI use:   no ARB use:  yes Coumadin use: no

## 2023-11-15 NOTE — Anesthesia Postprocedure Evaluation (Signed)
 Anesthesia Post Note  Patient: Donna Garza  Procedure(s) Performed: LEFT FEMORAL ENDARTERECTOMY WITH XENSURE BIOLOGIC PATCH (Left: Leg Upper) FEMORAL-POPLITEAL ARTERY BYPASS WITH GORE PROPATEN VASCULAR GRAFT (Left: Leg Upper)     Patient location during evaluation: PACU Anesthesia Type: General Level of consciousness: awake and alert Pain management: pain level controlled Vital Signs Assessment: post-procedure vital signs reviewed and stable Respiratory status: spontaneous breathing, nonlabored ventilation and respiratory function stable Cardiovascular status: blood pressure returned to baseline and stable Postop Assessment: no apparent nausea or vomiting Anesthetic complications: no   No notable events documented.                  Jakiah Goree

## 2023-12-03 ENCOUNTER — Ambulatory Visit: Attending: Vascular Surgery | Admitting: Physician Assistant

## 2023-12-03 ENCOUNTER — Encounter: Payer: Self-pay | Admitting: Physician Assistant

## 2023-12-03 VITALS — BP 148/82 | HR 72 | Temp 97.8°F | Wt 163.4 lb

## 2023-12-03 DIAGNOSIS — I70222 Atherosclerosis of native arteries of extremities with rest pain, left leg: Secondary | ICD-10-CM

## 2023-12-03 NOTE — Progress Notes (Signed)
  POST OPERATIVE OFFICE NOTE    CC:  F/u for surgery  HPI: Donna Garza is a 83 y.o. female who is here for incision check.  She recently underwent left common femoral, profundofemoral, superficial femoral, and external iliac artery endarterectomy with bovine pericardial patch angioplasty and left common femoral to above-knee popliteal artery bypass with PTFE on 11/11/2023 by Dr. Vikki Graves.  This was done for critical limb ischemia with rest pain.  She also has a history of right common femoral to popliteal artery bypass for lifestyle limiting claudication.  She returns today for follow-up.  She says that she is doing very well.  She denies any issues with her incisions such as drainage, redness, tenderness, or dehiscence.  She says that she is working on increasing her mobility.  She denies any claudication, rest pain, or tissue loss.  She takes a daily aspirin  and statin.   Allergies  Allergen Reactions   Diphenhydramine Other (See Comments)    "knocks me Completely out" Makes pt go to sleep.    Current Outpatient Medications  Medication Sig Dispense Refill   amLODipine  (NORVASC ) 2.5 MG tablet Take 1 tablet (2.5 mg total) by mouth daily. 90 tablet 3   aspirin  81 MG tablet Take 81 mg by mouth daily.     Coenzyme Q10 (COQ10) 100 MG CAPS Take 100 mg by mouth daily.     dorzolamide  (TRUSOPT ) 2 % ophthalmic solution Place 1 drop into the right eye 2 (two) times daily.     hydrochlorothiazide  (HYDRODIURIL ) 25 MG tablet Take 25 mg by mouth daily.     loratadine  (CLARITIN ) 10 MG tablet Take 10 mg by mouth daily.     losartan  (COZAAR ) 100 MG tablet Take 100 mg by mouth daily.     omeprazole (PRILOSEC) 20 MG capsule Take 20 mg by mouth daily.      rosuvastatin  (CRESTOR ) 10 MG tablet Take 2 tablets (20 mg total) by mouth daily. 90 tablet 3   oxyCODONE -acetaminophen  (PERCOCET) 5-325 MG tablet Take 1 tablet by mouth every 6 (six) hours as needed for severe pain (pain score 7-10). 12 tablet 0   No  current facility-administered medications for this visit.     ROS:  See HPI  Physical Exam:   Incision: Well-healing left groin and above-knee incisions without signs of infection or hematoma Extremities: Palpable left DP pulse Neuro: Intact motor and sensation of left lower extremity    Assessment/Plan:  This is a 83 y.o. female who is here for incision check  - The patient recently underwent extensive left common femoral endarterectomy with left common femoral to above-knee popliteal artery bypass for critical limb ischemia with rest pain -Her left groin and left above-knee incisions are almost completely healed without signs of infection or dehiscence -Her left lower extremity is warm and well-perfused with a palpable DP pulse -She denies any rest pain, claudication, or tissue loss - She will continue her aspirin  and statin.  We can bring the patient back in 4 to 6 weeks with LLE BPG duplex and ABIs   Deneise Finlay, PA-C [facility] [phone]   Clinic MD:  Vikki Graves

## 2023-12-05 DIAGNOSIS — R5383 Other fatigue: Secondary | ICD-10-CM | POA: Diagnosis not present

## 2023-12-05 DIAGNOSIS — E876 Hypokalemia: Secondary | ICD-10-CM | POA: Diagnosis not present

## 2023-12-05 DIAGNOSIS — N183 Chronic kidney disease, stage 3 unspecified: Secondary | ICD-10-CM | POA: Diagnosis not present

## 2023-12-05 DIAGNOSIS — R531 Weakness: Secondary | ICD-10-CM | POA: Diagnosis not present

## 2023-12-05 DIAGNOSIS — E559 Vitamin D deficiency, unspecified: Secondary | ICD-10-CM | POA: Diagnosis not present

## 2023-12-05 DIAGNOSIS — Z131 Encounter for screening for diabetes mellitus: Secondary | ICD-10-CM | POA: Diagnosis not present

## 2023-12-11 ENCOUNTER — Other Ambulatory Visit: Payer: Self-pay | Admitting: *Deleted

## 2023-12-11 DIAGNOSIS — E782 Mixed hyperlipidemia: Secondary | ICD-10-CM | POA: Diagnosis not present

## 2023-12-11 DIAGNOSIS — I70222 Atherosclerosis of native arteries of extremities with rest pain, left leg: Secondary | ICD-10-CM

## 2023-12-11 DIAGNOSIS — I739 Peripheral vascular disease, unspecified: Secondary | ICD-10-CM

## 2023-12-11 DIAGNOSIS — Z23 Encounter for immunization: Secondary | ICD-10-CM | POA: Diagnosis not present

## 2023-12-11 DIAGNOSIS — I10 Essential (primary) hypertension: Secondary | ICD-10-CM | POA: Diagnosis not present

## 2023-12-11 DIAGNOSIS — K2100 Gastro-esophageal reflux disease with esophagitis, without bleeding: Secondary | ICD-10-CM | POA: Diagnosis not present

## 2023-12-12 ENCOUNTER — Encounter (INDEPENDENT_AMBULATORY_CARE_PROVIDER_SITE_OTHER): Admitting: Ophthalmology

## 2023-12-12 DIAGNOSIS — D3132 Benign neoplasm of left choroid: Secondary | ICD-10-CM

## 2023-12-12 DIAGNOSIS — D3131 Benign neoplasm of right choroid: Secondary | ICD-10-CM

## 2023-12-12 DIAGNOSIS — H43812 Vitreous degeneration, left eye: Secondary | ICD-10-CM | POA: Diagnosis not present

## 2023-12-12 DIAGNOSIS — H35033 Hypertensive retinopathy, bilateral: Secondary | ICD-10-CM

## 2023-12-12 DIAGNOSIS — H34831 Tributary (branch) retinal vein occlusion, right eye, with macular edema: Secondary | ICD-10-CM

## 2023-12-12 DIAGNOSIS — I10 Essential (primary) hypertension: Secondary | ICD-10-CM

## 2023-12-12 DIAGNOSIS — H35372 Puckering of macula, left eye: Secondary | ICD-10-CM

## 2023-12-15 ENCOUNTER — Ambulatory Visit: Admitting: Cardiology

## 2024-01-21 ENCOUNTER — Ambulatory Visit (HOSPITAL_COMMUNITY)
Admission: RE | Admit: 2024-01-21 | Discharge: 2024-01-21 | Disposition: A | Discharge status: Home / Self Care | Source: Ambulatory Visit | Attending: Vascular Surgery | Admitting: Vascular Surgery

## 2024-01-21 ENCOUNTER — Ambulatory Visit

## 2024-01-21 VITALS — BP 151/76 | HR 60 | Temp 97.6°F | Ht 66.0 in | Wt 163.7 lb

## 2024-01-21 DIAGNOSIS — I70222 Atherosclerosis of native arteries of extremities with rest pain, left leg: Secondary | ICD-10-CM | POA: Diagnosis not present

## 2024-01-21 DIAGNOSIS — I739 Peripheral vascular disease, unspecified: Secondary | ICD-10-CM

## 2024-01-21 LAB — VAS US ABI WITH/WO TBI
Left ABI: 0.75
Right ABI: 0.63
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
## 2024-01-22 ENCOUNTER — Other Ambulatory Visit: Payer: Self-pay | Admitting: *Deleted

## 2024-01-22 DIAGNOSIS — I70222 Atherosclerosis of native arteries of extremities with rest pain, left leg: Secondary | ICD-10-CM

## 2024-01-22 DIAGNOSIS — I739 Peripheral vascular disease, unspecified: Secondary | ICD-10-CM

## 2024-02-04 ENCOUNTER — Encounter (INDEPENDENT_AMBULATORY_CARE_PROVIDER_SITE_OTHER): Admitting: Ophthalmology

## 2024-02-04 DIAGNOSIS — H35372 Puckering of macula, left eye: Secondary | ICD-10-CM | POA: Diagnosis not present

## 2024-02-04 DIAGNOSIS — H43812 Vitreous degeneration, left eye: Secondary | ICD-10-CM | POA: Diagnosis not present

## 2024-02-04 DIAGNOSIS — H34831 Tributary (branch) retinal vein occlusion, right eye, with macular edema: Secondary | ICD-10-CM

## 2024-02-04 DIAGNOSIS — I10 Essential (primary) hypertension: Secondary | ICD-10-CM | POA: Diagnosis not present

## 2024-02-04 DIAGNOSIS — D3132 Benign neoplasm of left choroid: Secondary | ICD-10-CM

## 2024-02-04 DIAGNOSIS — H35033 Hypertensive retinopathy, bilateral: Secondary | ICD-10-CM

## 2024-02-04 DIAGNOSIS — D3131 Benign neoplasm of right choroid: Secondary | ICD-10-CM | POA: Diagnosis not present

## 2024-03-10 ENCOUNTER — Ambulatory Visit (HOSPITAL_COMMUNITY)
Admission: RE | Admit: 2024-03-10 | Discharge: 2024-03-10 | Disposition: A | Discharge status: Home / Self Care | Source: Ambulatory Visit | Attending: Physician Assistant | Admitting: Physician Assistant

## 2024-03-10 ENCOUNTER — Encounter: Payer: Self-pay | Admitting: Vascular Surgery

## 2024-03-10 ENCOUNTER — Ambulatory Visit: Admitting: Vascular Surgery

## 2024-03-10 ENCOUNTER — Ambulatory Visit (HOSPITAL_BASED_OUTPATIENT_CLINIC_OR_DEPARTMENT_OTHER)
Admission: RE | Admit: 2024-03-10 | Discharge: 2024-03-10 | Discharge status: Home / Self Care | Source: Ambulatory Visit | Attending: Physician Assistant | Admitting: Physician Assistant

## 2024-03-10 VITALS — BP 164/78 | HR 58 | Temp 97.9°F | Ht 66.0 in | Wt 162.0 lb

## 2024-03-10 DIAGNOSIS — I70222 Atherosclerosis of native arteries of extremities with rest pain, left leg: Secondary | ICD-10-CM | POA: Diagnosis not present

## 2024-03-10 DIAGNOSIS — I739 Peripheral vascular disease, unspecified: Secondary | ICD-10-CM

## 2024-03-10 DIAGNOSIS — I70221 Atherosclerosis of native arteries of extremities with rest pain, right leg: Secondary | ICD-10-CM

## 2024-03-10 LAB — VAS US ABI WITH/WO TBI
Left ABI: 0.88
Right ABI: 0.76
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
## 2024-03-11 ENCOUNTER — Other Ambulatory Visit: Payer: Self-pay | Admitting: *Deleted

## 2024-03-11 DIAGNOSIS — I70222 Atherosclerosis of native arteries of extremities with rest pain, left leg: Secondary | ICD-10-CM

## 2024-03-11 DIAGNOSIS — I739 Peripheral vascular disease, unspecified: Secondary | ICD-10-CM

## 2024-03-31 ENCOUNTER — Encounter (INDEPENDENT_AMBULATORY_CARE_PROVIDER_SITE_OTHER): Payer: Self-pay

## 2024-03-31 ENCOUNTER — Encounter (INDEPENDENT_AMBULATORY_CARE_PROVIDER_SITE_OTHER): Admitting: Ophthalmology

## 2024-04-01 ENCOUNTER — Encounter (INDEPENDENT_AMBULATORY_CARE_PROVIDER_SITE_OTHER): Admitting: Ophthalmology

## 2024-04-01 DIAGNOSIS — D3131 Benign neoplasm of right choroid: Secondary | ICD-10-CM | POA: Diagnosis not present

## 2024-04-01 DIAGNOSIS — I10 Essential (primary) hypertension: Secondary | ICD-10-CM | POA: Diagnosis not present

## 2024-04-01 DIAGNOSIS — H34831 Tributary (branch) retinal vein occlusion, right eye, with macular edema: Secondary | ICD-10-CM | POA: Diagnosis not present

## 2024-04-01 DIAGNOSIS — H35033 Hypertensive retinopathy, bilateral: Secondary | ICD-10-CM

## 2024-04-01 DIAGNOSIS — H35372 Puckering of macula, left eye: Secondary | ICD-10-CM | POA: Diagnosis not present

## 2024-04-01 DIAGNOSIS — H43812 Vitreous degeneration, left eye: Secondary | ICD-10-CM | POA: Diagnosis not present

## 2024-04-01 DIAGNOSIS — D3132 Benign neoplasm of left choroid: Secondary | ICD-10-CM | POA: Diagnosis not present

## 2024-04-08 DIAGNOSIS — E7849 Other hyperlipidemia: Secondary | ICD-10-CM | POA: Diagnosis not present

## 2024-04-08 DIAGNOSIS — E1122 Type 2 diabetes mellitus with diabetic chronic kidney disease: Secondary | ICD-10-CM | POA: Diagnosis not present

## 2024-04-08 DIAGNOSIS — Z130 Encounter for screening for diseases of the blood and blood-forming organs and certain disorders involving the immune mechanism: Secondary | ICD-10-CM | POA: Diagnosis not present

## 2024-04-08 DIAGNOSIS — E876 Hypokalemia: Secondary | ICD-10-CM | POA: Diagnosis not present

## 2024-04-08 DIAGNOSIS — E1151 Type 2 diabetes mellitus with diabetic peripheral angiopathy without gangrene: Secondary | ICD-10-CM | POA: Diagnosis not present

## 2024-04-08 DIAGNOSIS — R718 Other abnormality of red blood cells: Secondary | ICD-10-CM | POA: Diagnosis not present

## 2024-04-08 DIAGNOSIS — N1831 Chronic kidney disease, stage 3a: Secondary | ICD-10-CM | POA: Diagnosis not present

## 2024-04-20 DIAGNOSIS — H26493 Other secondary cataract, bilateral: Secondary | ICD-10-CM | POA: Diagnosis not present

## 2024-04-20 DIAGNOSIS — H40023 Open angle with borderline findings, high risk, bilateral: Secondary | ICD-10-CM | POA: Diagnosis not present

## 2024-04-20 DIAGNOSIS — H524 Presbyopia: Secondary | ICD-10-CM | POA: Diagnosis not present

## 2024-04-20 DIAGNOSIS — H34831 Tributary (branch) retinal vein occlusion, right eye, with macular edema: Secondary | ICD-10-CM | POA: Diagnosis not present

## 2024-04-20 DIAGNOSIS — D3131 Benign neoplasm of right choroid: Secondary | ICD-10-CM | POA: Diagnosis not present

## 2024-05-03 ENCOUNTER — Encounter (HOSPITAL_COMMUNITY): Payer: Self-pay

## 2024-05-03 ENCOUNTER — Observation Stay (HOSPITAL_COMMUNITY)
Admission: EM | Admit: 2024-05-03 | Discharge: 2024-05-04 | Disposition: A | Discharge status: Home / Self Care | Attending: Internal Medicine | Admitting: Internal Medicine

## 2024-05-03 ENCOUNTER — Other Ambulatory Visit: Payer: Self-pay

## 2024-05-03 ENCOUNTER — Emergency Department (HOSPITAL_COMMUNITY)

## 2024-05-03 ENCOUNTER — Inpatient Hospital Stay (HOSPITAL_COMMUNITY)

## 2024-05-03 ENCOUNTER — Telehealth: Payer: Self-pay | Admitting: Cardiovascular Disease

## 2024-05-03 DIAGNOSIS — R202 Paresthesia of skin: Secondary | ICD-10-CM | POA: Diagnosis present

## 2024-05-03 DIAGNOSIS — E119 Type 2 diabetes mellitus without complications: Secondary | ICD-10-CM

## 2024-05-03 DIAGNOSIS — I63532 Cerebral infarction due to unspecified occlusion or stenosis of left posterior cerebral artery: Secondary | ICD-10-CM | POA: Diagnosis not present

## 2024-05-03 DIAGNOSIS — I129 Hypertensive chronic kidney disease with stage 1 through stage 4 chronic kidney disease, or unspecified chronic kidney disease: Secondary | ICD-10-CM | POA: Insufficient documentation

## 2024-05-03 DIAGNOSIS — I10 Essential (primary) hypertension: Secondary | ICD-10-CM | POA: Diagnosis present

## 2024-05-03 DIAGNOSIS — E1122 Type 2 diabetes mellitus with diabetic chronic kidney disease: Secondary | ICD-10-CM | POA: Insufficient documentation

## 2024-05-03 DIAGNOSIS — N1831 Chronic kidney disease, stage 3a: Secondary | ICD-10-CM | POA: Diagnosis not present

## 2024-05-03 DIAGNOSIS — I493 Ventricular premature depolarization: Secondary | ICD-10-CM | POA: Insufficient documentation

## 2024-05-03 DIAGNOSIS — Z79899 Other long term (current) drug therapy: Secondary | ICD-10-CM | POA: Diagnosis not present

## 2024-05-03 DIAGNOSIS — I639 Cerebral infarction, unspecified: Secondary | ICD-10-CM | POA: Diagnosis not present

## 2024-05-03 DIAGNOSIS — I739 Peripheral vascular disease, unspecified: Secondary | ICD-10-CM | POA: Diagnosis present

## 2024-05-03 DIAGNOSIS — Z7982 Long term (current) use of aspirin: Secondary | ICD-10-CM | POA: Diagnosis not present

## 2024-05-03 DIAGNOSIS — E1322 Other specified diabetes mellitus with diabetic chronic kidney disease: Secondary | ICD-10-CM | POA: Diagnosis not present

## 2024-05-03 DIAGNOSIS — N183 Chronic kidney disease, stage 3 unspecified: Secondary | ICD-10-CM | POA: Diagnosis present

## 2024-05-03 DIAGNOSIS — I63212 Cerebral infarction due to unspecified occlusion or stenosis of left vertebral arteries: Secondary | ICD-10-CM | POA: Diagnosis not present

## 2024-05-03 DIAGNOSIS — I6622 Occlusion and stenosis of left posterior cerebral artery: Secondary | ICD-10-CM | POA: Diagnosis not present

## 2024-05-03 DIAGNOSIS — I720 Aneurysm of carotid artery: Secondary | ICD-10-CM | POA: Diagnosis not present

## 2024-05-03 DIAGNOSIS — I63511 Cerebral infarction due to unspecified occlusion or stenosis of right middle cerebral artery: Secondary | ICD-10-CM | POA: Diagnosis not present

## 2024-05-03 LAB — CBC WITH DIFFERENTIAL/PLATELET
Abs Immature Granulocytes: 0.03 K/uL (ref 0.00–0.07)
Basophils Absolute: 0.1 K/uL (ref 0.0–0.1)
Basophils Relative: 1 %
Eosinophils Absolute: 0.5 K/uL (ref 0.0–0.5)
Eosinophils Relative: 6 %
HCT: 37.6 % (ref 36.0–46.0)
Hemoglobin: 12.3 g/dL (ref 12.0–15.0)
Immature Granulocytes: 0 %
Lymphocytes Relative: 21 %
Lymphs Abs: 1.7 K/uL (ref 0.7–4.0)
MCH: 27 pg (ref 26.0–34.0)
MCHC: 32.7 g/dL (ref 30.0–36.0)
MCV: 82.5 fL (ref 80.0–100.0)
Monocytes Absolute: 0.6 K/uL (ref 0.1–1.0)
Monocytes Relative: 8 %
Neutro Abs: 5.3 K/uL (ref 1.7–7.7)
Neutrophils Relative %: 64 %
Platelets: 271 K/uL (ref 150–400)
RBC: 4.56 MIL/uL (ref 3.87–5.11)
RDW: 15.4 % (ref 11.5–15.5)
WBC: 8.2 K/uL (ref 4.0–10.5)
nRBC: 0 % (ref 0.0–0.2)

## 2024-05-03 LAB — COMPREHENSIVE METABOLIC PANEL WITH GFR
ALT: 12 U/L (ref 0–44)
AST: 15 U/L (ref 15–41)
Albumin: 3.8 g/dL (ref 3.5–5.0)
Alkaline Phosphatase: 96 U/L (ref 38–126)
Anion gap: 12 (ref 5–15)
BUN: 18 mg/dL (ref 8–23)
CO2: 27 mmol/L (ref 22–32)
Calcium: 9.7 mg/dL (ref 8.9–10.3)
Chloride: 97 mmol/L — ABNORMAL LOW (ref 98–111)
Creatinine, Ser: 1.15 mg/dL — ABNORMAL HIGH (ref 0.44–1.00)
GFR, Estimated: 48 mL/min — ABNORMAL LOW (ref >60)
Glucose, Bld: 140 mg/dL — ABNORMAL HIGH (ref 70–99)
Potassium: 3.9 mmol/L (ref 3.5–5.1)
Sodium: 136 mmol/L (ref 135–145)
Total Bilirubin: 0.7 mg/dL (ref 0.0–1.2)
Total Protein: 7.4 g/dL (ref 6.5–8.1)

## 2024-05-03 LAB — CBG MONITORING, ED: Glucose-Capillary: 134 mg/dL — ABNORMAL HIGH (ref 70–99)

## 2024-05-03 MED ORDER — ACETAMINOPHEN 650 MG RE SUPP: 650.0000 mg | RECTAL | Status: DC | PRN

## 2024-05-03 MED ORDER — ENOXAPARIN SODIUM 40 MG/0.4ML IJ SOSY
40.0000 mg | PREFILLED_SYRINGE | INTRAMUSCULAR | Status: DC
  Administered 2024-05-03: 40 mg via SUBCUTANEOUS
  Filled 2024-05-03: qty 0.4

## 2024-05-03 MED ORDER — CLOPIDOGREL BISULFATE 75 MG PO TABS
300.0000 mg | ORAL_TABLET | Freq: Once | ORAL | Status: AC
  Administered 2024-05-03: 300 mg via ORAL
  Filled 2024-05-03: qty 4

## 2024-05-03 MED ORDER — ASPIRIN 81 MG PO CHEW
243.0000 mg | CHEWABLE_TABLET | Freq: Once | ORAL | Status: AC
  Administered 2024-05-03: 243 mg via ORAL
  Filled 2024-05-03: qty 3

## 2024-05-03 MED ORDER — SENNOSIDES-DOCUSATE SODIUM 8.6-50 MG PO TABS: 1.0000 | ORAL_TABLET | Freq: Every evening | ORAL | Status: DC | PRN

## 2024-05-03 MED ORDER — ACETAMINOPHEN 160 MG/5ML PO SOLN: 650.0000 mg | ORAL | Status: DC | PRN

## 2024-05-03 MED ORDER — IOHEXOL 350 MG/ML SOLN
75.0000 mL | Freq: Once | INTRAVENOUS | Status: AC | PRN
  Administered 2024-05-03: 75 mL via INTRAVENOUS

## 2024-05-03 MED ORDER — STROKE: EARLY STAGES OF RECOVERY BOOK: Freq: Once | Status: AC

## 2024-05-03 MED ORDER — ROSUVASTATIN CALCIUM 20 MG PO TABS
20.0000 mg | ORAL_TABLET | Freq: Every day | ORAL | Status: DC
Start: 2024-05-03 — End: 2024-05-04
  Administered 2024-05-03: 20 mg via ORAL
  Filled 2024-05-03: qty 1

## 2024-05-03 MED ORDER — ACETAMINOPHEN 325 MG PO TABS: 650.0000 mg | ORAL_TABLET | ORAL | Status: DC | PRN

## 2024-05-03 NOTE — ED Triage Notes (Signed)
 Pt arrived via POV from home after calling her Cardiologist and being advised to seek medical evaluation in the ER for possible recent Stroke. Pt reports apprx at 1800 on Saturday she began noticing numbness in her right arm and has since experienced and unsteady gait. Pt reports sometime she can hold her right arm up and sometimes it falls. Pt denies visual changes. Pt denies blood thinners.

## 2024-05-03 NOTE — ED Notes (Signed)
 ED Provider at bedside.

## 2024-05-03 NOTE — Plan of Care (Signed)
 On call neurology note  Called by EDP Pt with LKW 2 days ago, with right arm numbness and gait issues MR brain shows left hemispheric strokes. MRA head with no acute findings on left MCA. Shows RMCA branch stenoses. Can admit to [facility] for stroke risk factor w/u. Would recommend CTA head and neck, Echo, A1c, Lipid panel. DAPT for now - possibly will need it for 3 weeks followed by [facility]SA only. Not a candidate for IVT or EVT due to LKW outside window. Please request Dr. Shelton for routine consult in the AM via tele. D/W Dr. Jakie Eligio Lav, MD Neurology

## 2024-05-03 NOTE — Telephone Encounter (Signed)
 Spoke with pt who stated she has been having right arm numbess from her shoulder all the way to her fingers since Saturday without an relief. Asked pt if she is able to raise both arms and they stay in place or if the right arm drops down to her side, pt stated that sometimes it stays, sometimes it drops down. Pt stated her arm is cold to touch. Advised pt she needs to go to ED to be evaluated and to rule out stroke. Pt verbalized understanding of plan and stated she would go to ED.

## 2024-05-03 NOTE — ED Provider Notes (Signed)
 [facility] AT [facility] Provider Note   CSN: [id number] Arrival date & time: 05/03/24  1300     Patient presents with: Numbness   Donna Garza is a 83 y.o. female.   83 year old female history of PAD, hypertension, and and hyperlipidemia who presents emergency department with right upper extremity numbness and weakness.  Symptoms started Saturday at 6 PM.  Says that she was sitting there relaxing when it felt like her right arm went numb.  Initially had some paresthesias but says that they have resolved and it feels like it is not her arm anymore.  She occasionally will drop things and will get weak.  Also has had some dizziness and difficulty walking.  No headache or neck pain.  Not on blood thinners.  No history of stroke.       Prior to Admission medications   Medication Sig Start Date End Date Taking? Authorizing Provider  amLODipine  (NORVASC ) 2.5 MG tablet Take 1 tablet (2.5 mg total) by mouth daily. 10/31/23 03/10/24  Croitoru, Mihai, MD  aspirin  81 MG tablet Take 81 mg by mouth daily.    [provider]  Coenzyme Q10 (COQ10) 100 MG CAPS Take 100 mg by mouth daily.    [provider]  dorzolamide  (TRUSOPT ) 2 % ophthalmic solution Place 1 drop into the right eye 2 (two) times daily.    [provider]  hydrochlorothiazide  (HYDRODIURIL ) 25 MG tablet Take 25 mg by mouth daily. 09/01/23   [provider]  loratadine  (CLARITIN ) 10 MG tablet Take 10 mg by mouth daily.    [provider]  losartan  (COZAAR ) 100 MG tablet Take 100 mg by mouth daily.    [provider]  omeprazole (PRILOSEC) 20 MG capsule Take 20 mg by mouth daily.     [provider]  rosuvastatin  (CRESTOR ) 20 MG tablet Take 20 mg by mouth daily. 03/09/24   [provider]    Allergies: Diphenhydramine    Review of Systems  Updated Vital Signs BP (!) 161/68 (BP Location: Left Arm)   Pulse 71   Temp 98.1 F (36.7  C) (Oral)   Resp 18   Ht 5' 6 (1.676 m)   Wt 73 kg   SpO2 97%   BMI 25.98 kg/m   Physical Exam Constitutional:      Appearance: Normal appearance.  Cardiovascular:     Rate and Rhythm: Normal rate and regular rhythm.     Pulses: Normal pulses.     Heart sounds: Normal heart sounds.  Pulmonary:     Effort: Pulmonary effort is normal.     Breath sounds: Normal breath sounds.  Neurological:     Mental Status: She is alert.     Comments: NIHSS Exam  Level of Consciousness: Alert  LOC Questions: Answers Month and Age Correctly  LOC Commands: Opens and Closes Eyes and Hands on command  Best Gaze: Horizontal ocular movements intact  Visual Fields: No visual field loss  Facial Palsy: None  L Upper Extremity Motor: No drift after 10 seconds  R Upper Extremity Motor: Drift but does not hit stretcher L Lower extremity Motor: No drift after 5 seconds  R Lower extremity Motor: No drift after 5 seconds  Ataxia: Absent  Sensory: Diminished sensation to light touch in right upper extremity Best Language: No aphasia  Dysarthria: No dysarthria  Neglect: No visual or sensory neglect        (all labs ordered are listed, but only  abnormal results are displayed) Labs Reviewed  COMPREHENSIVE METABOLIC PANEL WITH GFR - Abnormal; Notable for the following components:      Result Value   Chloride 97 (*)    Glucose, Bld 140 (*)    Creatinine, Ser 1.15 (*)    GFR, Estimated 48 (*)    All other components within normal limits  CBG MONITORING, ED - Abnormal; Notable for the following components:   Glucose-Capillary 134 (*)    All other components within normal limits  CBC WITH DIFFERENTIAL/PLATELET  LIPID PANEL  HEMOGLOBIN A1C  CBG MONITORING, ED    EKG: EKG Interpretation Date/Time:  Monday May 03 2024 13:13:44 EDT Ventricular Rate:  73 PR Interval:  166 QRS Duration:  74 QT Interval:  390 QTC Calculation: 429 R Axis:   17  Text Interpretation: Normal sinus rhythm  Cannot rule out Anterior infarct , age undetermined Abnormal ECG When compared with ECG of 06-Oct-2023 13:47, Minimal criteria for Anterior infarct are now Present Inverted T waves have replaced nonspecific T wave abnormality in Inferior leads Nonspecific T wave abnormality now evident in Anterior leads Confirmed by Yolande Charleston [phone]) on 05/03/2024 3:41:27 PM  Radiology: CT ANGIO HEAD NECK W WO CM Result Date: 05/03/2024 EXAM: CTA HEAD AND NECK WITH AND WITHOUT 05/03/2024 06:32:34 PM TECHNIQUE: CTA of the head and neck was performed with and without the administration of 75 mL of iohexol  (OMNIPAQUE ) 350 MG/ML injection. Multiplanar 2D and/or 3D reformatted images are provided for review. Automated exposure control, iterative reconstruction, and/or weight based adjustment of the mA/kV was utilized to reduce the radiation dose to as low as reasonably achievable. Stenosis of the internal carotid arteries measured using NASCET criteria. COMPARISON: Same day MRI and MRA head. CLINICAL HISTORY: Stroke. Patient arrived via POV from home after calling her Cardiologist and being advised to seek medical evaluation in the ER for possible recent Stroke. Patient reports approximately at 1800 on Saturday she began noticing numbness in her right arm and has since experienced unsteady gait. Patient reports sometimes she can hold her right arm up and sometimes it falls. Patient denies visual changes. Patient denies blood thinners. FINDINGS: CTA NECK: AORTIC ARCH AND ARCH VESSELS: Mild to moderate atherosclerosis of the visualized aortic arch. Atherosclerosis of the proximal left subclavian artery along with tortuosity of the vessel resulting in focal moderate stenosis. Additional atherosclerosis and mild multifocal stenosis of the distal right subclavian artery. No dissection or arterial injury. CERVICAL CAROTID ARTERIES: The right carotid artery is patent from the origin to the skull base. There is mild atherosclerosis of  the common carotid artery, atherosclerosis at the right carotid bifurcation and proximal right cervical ICA. There is approximately 75% stenosis of the proximal right cervical ICA. The left carotid artery is patent from the origin to the skull base. There is calcified atherosclerosis at the carotid bifurcation without hemodynamically significant stenosis of the internal carotid artery. There is significant stenosis at the origin of the left external carotid artery. No dissection or arterial injury. CERVICAL VERTEBRAL ARTERIES: The vertebral arteries are patent from the origins to the proximal aspect of the intracranial segments. The right vertebral artery is patent to the vertebrobasilar confluence. There is minimal atherosclerosis of the proximal right V4 segment. The V4 segment of the nondominant left vertebral artery appears occluded after the origin of the left PICA. There is reconstitution of the distal left P4 segment which appears irregular and may be supplied via retrograde flow. No dissection or arterial injury. LUNGS AND MEDIASTINUM:  Unremarkable. SOFT TISSUES: Bilateral lens replacement. Mild mucosal thickening in the right maxillary sinus. No acute abnormality. BONES: Degenerative changes of the visualized spine. Degenerative changes of the bilateral mandibular condyles. No acute abnormality. CTA HEAD: ANTERIOR CIRCULATION: The intracranial internal carotid arteries are patent bilaterally. There is atherosclerosis of the bilateral carotid siphons resulting in mild stenosis. There is a 1.5 mm inferiorly directed outpouching along the right supraclinoid right ICA likely reflecting an infundibulum versus small aneurysm. The anterior cerebral arteries are patent bilaterally. There is severe stenosis of the A3 segment of the left ACA. 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 Redemonstrated severe stenosis and irregularity  of a proximal M2 inferior division branch of the right MCA. Additional mild atherosclerotic irregularity of multiple M2 branches of the right MCA. Additional atherosclerotic irregularity of multiple M2 branches of the left MCA. POSTERIOR CIRCULATION: The right posterior cerebral artery is patent. There is mild to moderate stenosis of the P2 segment of the right PCA. Fetal origin of the left PCA. There is severe stenosis of the proximal P2 segment at the left PCA. Additional multifocal severe stenosis of the distal P2 and P3 segments of the left PCA. No significant stenosis of the basilar artery. No significant stenosis of the vertebral arteries. OTHER: Scattered areas of acute infarct in the left frontoparietal lobes are better appreciated on same day MRI. Small remote infarct in the right cerebellum. No dural venous sinus thrombosis on this non-dedicated study. IMPRESSION: 1. 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 Reconstitution of the distal left V4 segment likely via retrograde flow. 2. Severe multifocal stenosis of the left PCA. 3. Severe stenosis of a proximal M2 branch of the right MCA. 4. Additional atherosclerosis involving multiple vessels with associated stenoses as detailed above. 5. 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 6. 1.5 mm inferiorly directed outpouching along the right supraclinoid right ICA, likely reflecting an infundibulum versus small aneurysm. Electronically signed by: Donnice Mania MD 05/03/2024 07:10 PM EDT RP Workstation: [id number]   MR ANGIO HEAD WO CONTRAST Result Date: 05/03/2024 EXAM: MR Angiography Head without intravenous Contrast. 05/03/2024 02:46:41 PM TECHNIQUE: Magnetic resonance angiography images of the head without intravenous contrast. Multiplanar 2D and 3D reformatted images are provided for review. COMPARISON: None provided.  CLINICAL HISTORY: Neuro deficit, acute, stroke suspected. FINDINGS: ANTERIOR CIRCULATION: Atherosclerotic irregularity of the intracranial internal carotid arteries without high grade stenosis. There is moderate to severe stenosis of a proximal M2 branch of the right MCA. Atherosclerotic irregularity of additional bilateral MCA branches without high grade stenosis or occlusion. The anterior cerebral arteries are patent bilaterally. There is slightly limited evaluation of the A2 and A3 segments due to motion artifact. No aneurysm. POSTERIOR CIRCULATION: The intracranial vertebral arteries are patent. The nondominant left vertebral artery terminates at the origin of the left PICA. The basilar artery is patent. The right posterior cerebral artery is patent. The left posterior cerebral artery is primarily supplied via the posterior communicating artery. There is significant irregularity and severe multifocal narrowing at the left posterior cerebral artery which is likely chronic. The superior cerebellar arteries are patent bilaterally. No aneurysm. IMPRESSION: 1. Moderate to severe stenosis of a proximal M2 branch of the right MCA. 2. Atherosclerotic irregularity of additional bilateral MCA branches without high  grade stenosis or occlusion. 3. Significant irregularity and severe multifocal narrowing at the left posterior cerebral artery, likely chronic. Electronically signed by: Donnice Mania MD 05/03/2024 04:22 PM EDT RP Workstation: [id number]   MR BRAIN WO CONTRAST Result Date: 05/03/2024 EXAM: MRI BRAIN WITHOUT CONTRAST 05/03/2024 02:46:41 PM TECHNIQUE: Multiplanar multisequence MRI of the head/brain was performed without the administration of intravenous contrast. COMPARISON: None available. CLINICAL HISTORY: Neuro deficit, acute, stroke suspected. Neuro deficit, acute. FINDINGS: BRAIN AND VENTRICLES: There are foci of restricted diffusion in the left frontoparietal lobes primarily involving the left postcentral  gyrus with additional focus in the posterior aspect of the left centrum Semiovale compatible with acute infarcts. There are scattered and confluent foci of T2 FLAIR hyperintensity which involve the periventricular and subcortical white matter suggestive of moderate chronic microvascular ischemic changes. Remote lacunar infarct in the left basal ganglia. Additional small remote infarcts in the right periventricular white matter and right frontal subcortical white matter. Remote infarcts in the right cerebellum. No intracranial hemorrhage. No mass. No midline shift. No hydrocephalus. The sella is unremarkable. Normal flow voids. ORBITS: Bilateral lens replacement. SINUSES AND MASTOIDS: Mucosal thickening in the right maxillary sinus. BONES AND SOFT TISSUES: Normal marrow signal. No acute soft tissue abnormality. IMPRESSION: 1. Acute infarcts in the left frontoparietal lobes, primarily involving the left postcentral gyrus, with an additional focus in the posterior aspect of the left centrum semiovale. 2. Moderate chronic microvascular ischemic changes. 3. Remote lacunar infarct in the left basal ganglia, additional small remote infarcts in the right periventricular white matter, right frontal subcortical white matter, and right cerebellum. Electronically signed by: Donnice Mania MD 05/03/2024 04:16 PM EDT RP Workstation: [id number]     Procedures   Medications Ordered in the ED  rosuvastatin  (CRESTOR ) tablet 20 mg (has no administration in time range)   stroke: early stages of recovery book (has no administration in time range)  acetaminophen  (TYLENOL ) tablet 650 mg (has no administration in time range)    Or  acetaminophen  (TYLENOL ) 160 MG/5ML solution 650 mg (has no administration in time range)    Or  acetaminophen  (TYLENOL ) suppository 650 mg (has no administration in time range)  senna-docusate (Senokot-S) tablet 1 tablet (has no administration in time range)  enoxaparin  (LOVENOX ) injection 40 mg  (has no administration in time range)  aspirin  chewable tablet 243 mg (243 mg Oral Given 05/03/24 1840)  clopidogrel (PLAVIX) tablet 300 mg (300 mg Oral Given 05/03/24 1838)  iohexol  (OMNIPAQUE ) 350 MG/ML injection 75 mL (75 mLs Intravenous Contrast Given 05/03/24 1814)    Clinical Course as of 05/03/24 1954  Mon May 03, 2024  1710 Discussed with Dr. Deedra from neurology who recommends CT angio head and neck and admission here at [facility]. [RP]  1733 Dr Pearlean from hospitalist consulted for admission [RP]    Clinical Course User Index [RP] Yolande Lamar BROCKS, MD                                 Medical Decision Making Amount and/or Complexity of Data Reviewed Labs: ordered. Radiology: ordered.  Risk OTC drugs. Prescription drug management. Decision regarding hospitalization.   83 year old female history of PAD, hypertension, and hyperlipidemia who presents to the emergency department with right upper extremity weakness and numbness since Saturday at 6 PM  Initial Ddx:  Stroke, ICH, cervical radiculopathy, cervical myelopathy, peripheral neuropathy  MDM/Course:  Patient presents emergency department with numbness and weakness of  her right upper extremity since Saturday at 6 PM.  Suspect stroke but she is outside the window for code stroke activation.  Had MRI ordered which shows a left-sided frontoparietal ischemic stroke.  Discussed with neurology who recommended CT angio head and neck and admission to [facility] for additional evaluation.  Given aspirin  and Plavix.  Admitted to hospitalist for further evaluation.  Upon re-evaluation no new symptoms.  This patient presents to the ED for concern of complaints listed in HPI, this involves an extensive number of treatment options, and is a complaint that carries with it a high risk of complications and morbidity. Disposition including potential need for admission considered.   Dispo: Admit to Floor  Additional history obtained  from friend Records reviewed Outpatient Clinic Notes The following labs were independently interpreted: Chemistry and show CKD I independently reviewed the following imaging with scope of interpretation limited to determining acute life threatening conditions related to emergency care: MRI brain and agree with the radiologist interpretation with the following exceptions: none I personally reviewed and interpreted cardiac monitoring: normal sinus rhythm  I personally reviewed and interpreted the pt's EKG: see above for interpretation  I have reviewed the patients home medications and made adjustments as needed Consults: Hospitalist and Neurology Social Determinants of health:  Geriatric  Portions of this note were generated with Scientist, clinical (histocompatibility and immunogenetics). Dictation errors may occur despite best attempts at proofreading.     Final diagnoses:  Acute ischemic stroke [facility])    ED Discharge Orders     None          Yolande Lamar BROCKS, MD 05/03/24 1954
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
## 2024-05-03 NOTE — ED Notes (Signed)
 Patient transported to CT

## 2024-05-03 NOTE — Telephone Encounter (Signed)
 Pt called in sating her right arm is numb. She states it started Saturday around 6pm. She states it is just her arm, nothing else, she is still able to move it. She denies any SOB, SOB. She state she does feel wobbly at times. Please advise.   117/73 hr 61

## 2024-05-04 ENCOUNTER — Inpatient Hospital Stay (INDEPENDENT_AMBULATORY_CARE_PROVIDER_SITE_OTHER)

## 2024-05-04 ENCOUNTER — Telehealth: Payer: Self-pay | Admitting: *Deleted

## 2024-05-04 ENCOUNTER — Inpatient Hospital Stay (HOSPITAL_BASED_OUTPATIENT_CLINIC_OR_DEPARTMENT_OTHER)

## 2024-05-04 DIAGNOSIS — Z7982 Long term (current) use of aspirin: Secondary | ICD-10-CM

## 2024-05-04 DIAGNOSIS — I7090 Unspecified atherosclerosis: Secondary | ICD-10-CM | POA: Diagnosis not present

## 2024-05-04 DIAGNOSIS — R29701 NIHSS score 1: Secondary | ICD-10-CM | POA: Diagnosis not present

## 2024-05-04 DIAGNOSIS — I639 Cerebral infarction, unspecified: Secondary | ICD-10-CM

## 2024-05-04 DIAGNOSIS — I6389 Other cerebral infarction: Secondary | ICD-10-CM | POA: Diagnosis not present

## 2024-05-04 LAB — ECHOCARDIOGRAM COMPLETE
AR max vel: 0.97 cm2
AV Area VTI: 1.18 cm2
AV Area mean vel: 1.08 cm2
AV Mean grad: 16 mmHg
AV Peak grad: 28.7 mmHg
Ao pk vel: 2.68 m/s
Area-P 1/2: 1.81 cm2
Height: 66 in
MV VTI: 1.72 cm2
S' Lateral: 2.9 cm
Weight: 2574.97 [oz_av]

## 2024-05-04 LAB — LIPID PANEL
Cholesterol: 144 mg/dL (ref 0–200)
HDL: 34 mg/dL — ABNORMAL LOW (ref >40)
LDL Cholesterol: 72 mg/dL (ref 0–99)
Total CHOL/HDL Ratio: 4.2 ratio
Triglycerides: 188 mg/dL — ABNORMAL HIGH (ref <150)
VLDL: 38 mg/dL (ref 0–40)

## 2024-05-04 LAB — HEMOGLOBIN A1C
Hgb A1c MFr Bld: 6.2 % — ABNORMAL HIGH (ref 4.8–5.6)
Mean Plasma Glucose: 131.24 mg/dL

## 2024-05-04 MED ORDER — CLOPIDOGREL BISULFATE 75 MG PO TABS
75.0000 mg | ORAL_TABLET | Freq: Every day | ORAL | Status: DC
  Administered 2024-05-04: 75 mg via ORAL
  Filled 2024-05-04: qty 1

## 2024-05-04 MED ORDER — ROSUVASTATIN CALCIUM 40 MG PO TABS: 40.0000 mg | ORAL_TABLET | Freq: Every day | ORAL | 0 refills | Status: AC

## 2024-05-04 MED ORDER — ASPIRIN 81 MG PO TBEC
81.0000 mg | DELAYED_RELEASE_TABLET | Freq: Every day | ORAL | Status: DC
  Administered 2024-05-04: 81 mg via ORAL
  Filled 2024-05-04: qty 1

## 2024-05-04 MED ORDER — ASPIRIN 81 MG PO TABS: 81.0000 mg | ORAL_TABLET | Freq: Every day | ORAL | 0 refills | Status: AC

## 2024-05-04 MED ORDER — CLOPIDOGREL BISULFATE 75 MG PO TABS: 75.0000 mg | ORAL_TABLET | Freq: Every day | ORAL | 1 refills | Status: AC

## 2024-05-04 MED ORDER — ROSUVASTATIN CALCIUM 20 MG PO TABS: 40.0000 mg | ORAL_TABLET | Freq: Every day | ORAL | Status: DC

## 2024-05-04 NOTE — Care Management Obs Status (Signed)
 MEDICARE OBSERVATION STATUS NOTIFICATION   Patient Details  Name: NASHALI DITMER MRN: [id number] Date of Birth: 13-Jul-1941   Medicare Observation Status Notification Given:  Yes    Sharlyne Stabs, RN 05/04/2024, 12:32 PM

## 2024-05-04 NOTE — Evaluation (Signed)
 Physical Therapy Evaluation Patient Details Name: NEKAYLA HEIDER MRN: [id number] DOB: 1941/04/10 Today's Date: 05/04/2024  History of Present Illness  JAIDENCE GEISLER is a 83 y.o. female with medical history significant for peripheral artery disease, CKD, hypertension, diabetes mellitus.  Patient presented to the ED with complaints of numbness and weakness to her right upper extremity-started at about 6 PM 2 days ago- saturday 9/27.  She reports her hand felt like it was sleeping, and she would lift her hand and it would sometimes drop, she would hold her phone and sometimes it would drop.  She denies involvement of her other extremities, no facial asymmetry, no change in speech no change in vision.  No prior history of stroke.  She is on aspirin -81 mg daily and reports compliance.   Clinical Impression  Pt was agreeable to completing today's PT and OT co-evaluation. She was mod I with all of today's functional mobility. However, she exhibited limited gait speed and stride length due to fear of falling while ambulating. She was left in the bed with the call bell within reach. Patient discharged to care of nursing for ambulation daily as tolerated for length of stay.        If plan is discharge home, recommend the following:     Can travel by private vehicle        Equipment Recommendations None recommended by PT  Recommendations for Other Services       Functional Status Assessment Patient has not had a recent decline in their functional status     Precautions / Restrictions Precautions Precautions: Fall Recall of Precautions/Restrictions: Intact Restrictions Weight Bearing Restrictions Per Provider Order: No      Mobility  Bed Mobility Overal bed mobility: Independent                  Transfers Overall transfer level: Modified independent                 General transfer comment: No AD; EOB to chair; mildly unsteady.    Ambulation/Gait Ambulation/Gait  assistance: Modified independent (Device/Increase time) Gait Distance (Feet): 80 Feet Assistive device: None Gait Pattern/deviations: Step-through pattern, Decreased stride length Gait velocity: decreased     General Gait Details: slow gait speed, but this increased as she became more confident  Stairs            Wheelchair Mobility     Tilt Bed    Modified Rankin (Stroke Patients Only)       Balance Overall balance assessment: Mild deficits observed, not formally tested                                           Pertinent Vitals/Pain Pain Assessment Pain Assessment: No/denies pain    Home Living Family/patient expects to be discharged to:: Private residence Living Arrangements: Alone Available Help at Discharge: Family;Available PRN/intermittently Type of Home: House Home Access: Stairs to enter Entrance Stairs-Rails: Left Entrance Stairs-Number of Steps: 5   Home Layout: One level Home Equipment: Grab bars - tub/shower;Rolling Walker (2 wheels)      Prior Function Prior Level of Function : Independent/Modified Independent;Driving             Mobility Comments: Tourist information centre manager without AD ADLs Comments: Independent; drives; works     Extremity/Trunk Assessment   Upper Extremity Assessment Upper Extremity Assessment: Defer to OT evaluation RUE  Deficits / Details: 4+/5 shoulder abduction; 5/5 otherwise. Decreased sensation to light touch in hand but nowhere else. RUE Sensation: decreased light touch RUE Coordination: WNL    Lower Extremity Assessment Lower Extremity Assessment: Generalized weakness    Cervical / Trunk Assessment Cervical / Trunk Assessment: Normal  Communication   Communication Communication: No apparent difficulties    Cognition Arousal: Alert Behavior During Therapy: WFL for tasks assessed/performed   PT - Cognitive impairments: No apparent impairments                         Following  commands: Intact       Cueing Cueing Techniques: Verbal cues     General Comments      Exercises     Assessment/Plan    PT Assessment Patient needs continued PT services  PT Problem List Decreased strength;Decreased activity tolerance;Decreased balance;Decreased mobility       PT Treatment Interventions Gait training;Stair training;Functional mobility training;Therapeutic activities;Therapeutic exercise;Balance training;Patient/family education    PT Goals (Current goals can be found in the Care Plan section)  Acute Rehab PT Goals Patient Stated Goal: return home PT Goal Formulation: With patient Time For Goal Achievement: 05/14/24 Potential to Achieve Goals: Good    Frequency Min 3X/week     Co-evaluation PT/OT/SLP Co-Evaluation/Treatment: Yes Reason for Co-Treatment: To address functional/ADL transfers PT goals addressed during session: Mobility/safety with mobility;Balance OT goals addressed during session: ADL's and self-care       AM-PAC PT 6 Clicks Mobility  Outcome Measure Help needed turning from your back to your side while in a flat bed without using bedrails?: None Help needed moving from lying on your back to sitting on the side of a flat bed without using bedrails?: None Help needed moving to and from a bed to a chair (including a wheelchair)?: None Help needed standing up from a chair using your arms (e.g., wheelchair or bedside chair)?: None Help needed to walk in hospital room?: None Help needed climbing 3-5 steps with a railing? : A Little 6 Click Score: 23    End of Session   Activity Tolerance: Patient tolerated treatment well Patient left: with call bell/phone within reach;in bed   PT Visit Diagnosis: Muscle weakness (generalized) (M62.81);Unsteadiness on feet (R26.81);Difficulty in walking, not elsewhere classified (R26.2)    Time: 9176-9158 PT Time Calculation (min) (ACUTE ONLY): 18 min   Charges:   PT Evaluation $PT Eval Low  Complexity: 1 Low PT Treatments $Therapeutic Activity: 8-22 mins PT General Charges $$ ACUTE PT VISIT: 1 Visit         Lacinda Fass, PT, DPT  05/04/2024, 12:15 PM

## 2024-05-04 NOTE — Hospital Course (Addendum)
 Donna Garza is a 83 y.o. female with PMH of peripheral artery disease, CKD3a b/l creat ~1, hypertension, diabetes mellitus presented to the ED with numbness and weakness to her right upper extremity-started  from 6 PM 2 days PTA on saturday 05/01/24.She would hold her phone and sometimes it would drop. In ED: Blood pressure 133-166.  MRI brain shows acute infarct left frontoparietal lobes. 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 (See detailed report). 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 (See detailed report). 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 Reconstitution of the distal left V4 segment likely via retrograde flow.Severe multifocal stenosis of the left PCA. Severe stenosis of a proximal M2 branch of the right MCA and other multiple findings see report-left ACA aneurysm right ICA aneurysm  Prophylactic therapy-received aspirin  and Plavix load in the ED.  Crestor  40 mg as  as LDL > 70 HgbA1c 6.4 LDL 72 Cont PT OT consult Frequent neuro checks Stroke swallow screen -passed diet ordered Teleneurology following Per neurology okay to discharge with  aspirin  81 and Plavix 75 for 3 months followed by monotherapy with Plavix. Continue rosuvastatin . Heart  monitor. Follow-up with neurology in 3 to 4 weeks I have messaged cardiology team Dr CHRISTELLA and Abby, to arrange for outpatient cardiac monitor  CKD-CKD 3A:Creatinine stable 1.1.   PVS: S/P bilateral lower extremity bypasses most recently on the left Hospitalized 11/2023 -common femoral artery heavily calcified and minimally pulsatile hence underwent extensive endarterectomy.  PTA on aspirin , added Plavix for above   Hypertension-stable. Hold hydrochlorothiazide -25, losartan  100 to allow for permissive htn   Diabetes mellitus-diet controlled.  Mobility: PT Orders: Active  PT Follow up Rec:    DVT prophylaxis: enoxaparin  (LOVENOX ) injection 40 mg Start: 05/03/24 2200 Code Status:   Code Status: Full Code Family Communication: plan of care discussed with patient at bedside. Patient status is: Remains hospitalized because of severity of illness Level of care: Telemetry   Dispo: The patient is from: home            Anticipated disposition: HOME Objective: Vitals last 24 hrs: Vitals:   05/03/24 1835 05/03/24 1921 05/03/24 2320 05/04/24 0320  BP:  (!) 161/68 112/73 (!) 148/69  Pulse: 80 71 97 73  Resp:  18 18 18   Temp:  98.1 F (36.7 C) 98.4 F (36.9 C) 98.3 F (36.8 C)  TempSrc:  Oral Oral Oral  SpO2: 98% 97% 96% 93%  Weight:  73 kg    Height:  5' 6 (1.676 m)      Physical Examination: General exam: alert awake, oriented, older than stated age HEENT:Oral mucosa moist, Ear/Nose WNL grossly Respiratory system: Bilaterally clear BS,no use of accessory muscle Cardiovascular system: S1 & S2 +, No JVD. Gastrointestinal system: Abdomen soft,NT,ND, BS+ Nervous System: Alert, awake, moving all extremities,and following commands.  Extremities: extremities warm, leg edema neg Skin: No rashes,no icterus. MSK: Normal muscle bulk,tone, power   Medications reviewed:  Scheduled Meds:   stroke: early stages of recovery book   Does not apply Once   aspirin  EC  81 mg Oral Daily    clopidogrel  75 mg Oral Daily   enoxaparin  (LOVENOX ) injection  40 mg Subcutaneous Q24H   rosuvastatin   40 mg Oral Daily   Continuous Infusions: Diet: Diet Order             Diet Heart Room service appropriate? Yes; Fluid consistency: Thin  Diet effective now           Diet - low sodium heart healthy

## 2024-05-04 NOTE — Discharge Summary (Signed)
 Physician Discharge Summary  [name] FMW:[id number] DOB: 1941/07/16 DOA: 05/03/2024  PCP: [facility]  Admit date: 05/03/2024 Discharge date: 05/04/2024 Recommendations for Outpatient Follow-up:  Follow up with PCP in 1 weeks-call for appointment Please obtain BMP/CBC in one week  Discharge Dispo: home Discharge Condition: Stable Code Status:   Code Status: Full Code Diet recommendation:  Diet Order             Diet Heart Room service appropriate? Yes; Fluid consistency: Thin  Diet effective now           Diet - low sodium heart healthy                    Brief/Interim Summary: Donna Garza is a 83 y.o. female with PMH of peripheral artery disease, CKD3a b/l creat ~1, hypertension, diabetes mellitus presented to the ED with numbness and weakness to her right upper extremity-started  from 6 PM 2 days PTA on saturday 05/01/24.She would hold her phone and sometimes it would drop. In ED: Blood pressure 133-166.  MRI brain shows acute infarct left frontoparietal lobes. 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 (See detailed report). 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 (See detailed report). 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 Reconstitution of the distal left V4 segment likely via retrograde flow.Severe multifocal stenosis of the left PCA. Severe stenosis of a proximal M2 branch of the right MCA and other multiple findings see report-left ACA aneurysm right ICA aneurysm  Prophylactic therapy-received aspirin  and Plavix load in the ED.  Crestor  40 mg as  as LDL > 70 HgbA1c 6.4 LDL 72 Cont PT OT consult Frequent neuro checks Stroke swallow screen -passed diet ordered Teleneurology following Per neurology okay to discharge with  aspirin  81 and Plavix 75 for 3 months followed by monotherapy with Plavix. Continue rosuvastatin . Heart monitor. Follow-up with neurology in 3 to 4 weeks I have messaged cardiology team Dr CHRISTELLA and Abby, to arrange for outpatient cardiac monitor  CKD-CKD 3A:Creatinine stable 1.1.   PVS: S/P bilateral lower extremity bypasses most recently on the left Hospitalized 11/2023 -common femoral artery heavily calcified and minimally pulsatile hence underwent extensive endarterectomy.  PTA on aspirin , added Plavix for above   Hypertension-stable. Hold hydrochlorothiazide -25, losartan  100 to allow for permissive htn   Diabetes mellitus-diet controlled.  Mobility: PT Orders: Active  PT Follow up Rec:    DVT prophylaxis: enoxaparin  (LOVENOX ) injection 40 mg Start: 05/03/24 2200 Code Status:   Code Status: Full Code Family Communication: plan of care discussed with patient at bedside. Patient status is: Remains hospitalized because of severity of illness Level of care: Telemetry   Dispo: The patient is from: home            Anticipated disposition: HOME Objective: Vitals last 24  hrs: Vitals:   05/03/24 1835 05/03/24 1921 05/03/24 2320 05/04/24 0320  BP:  (!) 161/68 112/73 (!) 148/69  Pulse: 80 71 97 73  Resp:  18 18 18   Temp:  98.1 F (36.7 C) 98.4 F (36.9 C) 98.3 F (36.8 C)  TempSrc:  Oral Oral Oral  SpO2: 98% 97% 96% 93%  Weight:  73 kg    Height:  5' 6 (1.676 m)       Physical Examination: General exam: alert awake, oriented, older than stated age HEENT:Oral mucosa moist, Ear/Nose WNL grossly Respiratory system: Bilaterally clear BS,no use of accessory muscle Cardiovascular system: S1 & S2 +, No JVD. Gastrointestinal system: Abdomen soft,NT,ND, BS+ Nervous System: Alert, awake, moving all extremities,and following commands. Extremities: extremities warm, leg edema neg Skin: No rashes,no icterus. MSK: Normal muscle bulk,tone, power   Medications reviewed:  Scheduled Meds:   stroke: early stages of recovery book   Does not apply Once   aspirin  EC  81 mg Oral Daily   clopidogrel  75 mg Oral Daily   enoxaparin  (LOVENOX ) injection  40 mg Subcutaneous Q24H   rosuvastatin   40 mg Oral Daily   Continuous Infusions: Diet: Diet Order             Diet Heart Room service appropriate? Yes; Fluid consistency: Thin  Diet effective now           Diet - low sodium heart healthy                       Consultation: See note.  Discharge Instructions  Discharge Instructions     Ambulatory referral to Neurology   Complete by: As directed    An appointment is requested in approximately: 4 weeks   Diet - low sodium heart healthy   Complete by: As directed    Discharge instructions   Complete by: As directed    Please call call MD or return to ER for similar or worsening recurring problem that brought you to hospital or if any fever,nausea/vomiting,abdominal pain, uncontrolled pain, chest pain,  shortness of breath or any other alarming symptoms.  Please follow-up your doctor as instructed in a week time and call the office for appointment.  Please avoid alcohol , smoking, or any other illicit substance and maintain healthy habits including taking your regular medications as prescribed.  You were cared for by a hospitalist during your hospital stay. If you have any questions about your discharge medications or the care you received while you  were in the hospital after you are discharged, you can call the unit and ask to speak with the hospitalist on call if the hospitalist that took care of you is not available.  Once you are discharged, your primary care physician will handle any further medical issues. Please note that NO REFILLS for any discharge medications will be authorized once you are discharged, as it is imperative that you return to your primary care physician (or establish a relationship with a primary care physician if you do not have one) for your aftercare needs so that they can reassess your need for medications and monitor your lab values   Increase activity slowly   Complete by: As directed       Allergies as of 05/04/2024       Reactions   Diphenhydramine Other (See Comments)   Knocks me completely out  Makes pt go to sleep.        Medication List  STOP taking these medications    omeprazole 20 MG capsule Commonly known as: PRILOSEC       TAKE these medications    amLODipine  2.5 MG tablet Commonly known as: NORVASC  Take 1 tablet (2.5 mg total) by mouth daily.   aspirin  81 MG tablet Take 1 tablet (81 mg total) by mouth daily.   clopidogrel 75 MG tablet Commonly known as: PLAVIX Take 1 tablet (75 mg total) by mouth daily.   CoQ10 100 MG Caps Take 100 mg by mouth at bedtime.   dorzolamide  2 % ophthalmic solution Commonly known as: TRUSOPT  Place 1 drop into the right eye 2 (two) times daily.   hydrochlorothiazide  25 MG tablet Commonly known as: HYDRODIURIL  Take 25 mg by mouth daily.   loratadine  10 MG tablet Commonly known as: CLARITIN  Take 10 mg by mouth daily.   losartan  100 MG tablet Commonly known as: COZAAR  Take 100 mg by mouth daily.   rosuvastatin  40 MG tablet Commonly known as: CRESTOR  Take 1 tablet (40 mg total) by mouth daily. What changed:  medication strength how much to take when to take this        Follow-up Information     [facility] Follow  up in 1 week(s).   Contact information: [address] [place], [address] [place]  [address] [phone]        Croitoru, Jerel, MD Follow up in 1 week(s).   Specialty: Cardiology Contact information: [address] [place] [address] [phone]                Allergies  Allergen Reactions   Diphenhydramine Other (See Comments)    Knocks me completely out  Makes pt go to sleep.    The results of significant diagnostics from this hospitalization (including imaging, microbiology, ancillary and laboratory) are listed below for reference.    Microbiology: No results found for this or any previous visit (from the past 240 hours).  Procedures/Studies: ECHOCARDIOGRAM COMPLETE Result Date: 05/04/2024    ECHOCARDIOGRAM REPORT   Patient Name:   KORRA CHRISTINE Date of Exam: 05/04/2024 Medical Rec #:  [id number]       Height:       66.0 in Accession #:    [id number]      Weight:       160.9 lb Date of Birth:  17-Jun-1941       BSA:          1.823 m Patient Age:    83 years        BP:           148/69 mmHg Patient Gender: F               HR:           64 bpm. Exam Location:  [facility] Procedure: 2D Echo, Cardiac Doppler and Color Doppler (Both Spectral and Color            Flow Doppler were utilized during procedure). Indications:    Stroke  History:        Patient has no prior history of Echocardiogram examinations.                 Signs/Symptoms:Murmur; Risk Factors:Hypertension and Diabetes.  Sonographer:    Jayson Gaskins Referring Phys: [id number] EJIROGHENE E EMOKPAE IMPRESSIONS  1. Left ventricular ejection fraction, by estimation, is 55 to 60%. The left ventricle has normal function. Left ventricular endocardial border not optimally defined to evaluate regional wall  motion. There is mild left ventricular hypertrophy. Left ventricular diastolic parameters are consistent with Grade I diastolic dysfunction (impaired relaxation). Elevated left ventricular end-diastolic  pressure.  2. Right ventricular systolic function is normal. The right ventricular size is normal. There is normal pulmonary artery systolic pressure.  3. The mitral valve is abnormal. Trivial mitral valve regurgitation. Mild mitral stenosis. The mean mitral valve gradient is 3.0 mmHg. Severe mitral annular calcification.  4. The aortic valve has an indeterminant number of cusps. There is severe calcifcation of the aortic valve. There is moderate thickening of the aortic valve. Aortic valve regurgitation is trivial. Mild to moderate aortic valve stenosis. Aortic valve area, by VTI measures 1.18 cm. Aortic valve mean gradient measures 16.0 mmHg. Aortic valve Vmax measures 2.68 m/s.  5. The inferior vena cava is normal in size with greater than 50% respiratory variability, suggesting right atrial pressure of 3 mmHg. Comparison(s): No prior Echocardiogram. FINDINGS  Left Ventricle: Left ventricular ejection fraction, by estimation, is 55 to 60%. The left ventricle has normal function. Left ventricular endocardial border not optimally defined to evaluate regional wall motion. Strain was performed and the global longitudinal strain is indeterminate. The left ventricular internal cavity size was normal in size. There is mild left ventricular hypertrophy. Left ventricular diastolic parameters are consistent with Grade I diastolic dysfunction (impaired relaxation).  Elevated left ventricular end-diastolic pressure. Right Ventricle: The right ventricular size is normal. No increase in right ventricular wall thickness. Right ventricular systolic function is normal. There is normal pulmonary artery systolic pressure. The tricuspid regurgitant velocity is 2.14 m/s, and  with an assumed right atrial pressure of 3 mmHg, the estimated right ventricular systolic pressure is 21.3 mmHg. Left Atrium: Left atrial size was normal in size. Right Atrium: Right atrial size was normal in size. Pericardium: There is no evidence of  pericardial effusion. Presence of epicardial fat layer. Mitral Valve: The mitral valve is abnormal. Severe mitral annular calcification. Trivial mitral valve regurgitation. Mild mitral valve stenosis. MV peak gradient, 8.5 mmHg. The mean mitral valve gradient is 3.0 mmHg. Tricuspid Valve: The tricuspid valve is grossly normal. Tricuspid valve regurgitation is trivial. No evidence of tricuspid stenosis. Aortic Valve: The aortic valve has an indeterminant number of cusps. There is severe calcifcation of the aortic valve. There is moderate thickening of the aortic valve. Aortic valve regurgitation is trivial. Mild to moderate aortic stenosis is present. Aortic valve mean gradient measures 16.0 mmHg. Aortic valve peak gradient measures 28.7 mmHg. Aortic valve area, by VTI measures 1.18 cm. Pulmonic Valve: The pulmonic valve was not well visualized. Pulmonic valve regurgitation is trivial. No evidence of pulmonic stenosis. Aorta: The aortic root is normal in size and structure. Venous: The inferior vena cava is normal in size with greater than 50% respiratory variability, suggesting right atrial pressure of 3 mmHg. IAS/Shunts: The interatrial septum was not well visualized. Additional Comments: 3D was performed not requiring image post processing on an independent workstation and was indeterminate.  LEFT VENTRICLE PLAX 2D LVIDd:         4.00 cm   Diastology LVIDs:         2.90 cm   LV e' medial:    2.94 cm/s LV PW:         0.90 cm   LV E/e' medial:  28.7 LV IVS:        1.20 cm   LV e' lateral:   4.57 cm/s LVOT diam:     1.90 cm   LV  E/e' lateral: 18.5 LV SV:         70 LV SV Index:   38 LVOT Area:     2.84 cm  RIGHT VENTRICLE RV S prime:     9.03 cm/s TAPSE (M-mode): 2.4 cm LEFT ATRIUM             Index        RIGHT ATRIUM           Index LA Vol (A2C):   22.2 ml 12.18 ml/m  RA Area:     12.70 cm LA Vol (A4C):   31.1 ml 17.06 ml/m  RA Volume:   29.00 ml  15.90 ml/m LA Biplane Vol: 25.9 ml 14.20 ml/m  AORTIC VALVE  AV Area (Vmax):    0.97 cm AV Area (Vmean):   1.08 cm AV Area (VTI):     1.18 cm AV Vmax:           268.00 cm/s AV Vmean:          186.000 cm/s AV VTI:            0.592 m AV Peak Grad:      28.7 mmHg AV Mean Grad:      16.0 mmHg LVOT Vmax:         91.80 cm/s LVOT Vmean:        70.900 cm/s LVOT VTI:          0.246 m LVOT/AV VTI ratio: 0.42  AORTA Ao Root diam: 2.90 cm MITRAL VALVE                TRICUSPID VALVE MV Area (PHT): 1.81 cm     TR Peak grad:   18.3 mmHg MV Area VTI:   1.72 cm     TR Vmax:        214.00 cm/s MV Peak grad:  8.5 mmHg MV Mean grad:  3.0 mmHg     SHUNTS MV Vmax:       1.46 m/s     Systemic VTI:  0.25 m MV Vmean:      84.7 cm/s    Systemic Diam: 1.90 cm MV Decel Time: 419 msec MV E velocity: 84.50 cm/s MV A velocity: 124.00 cm/s MV E/A ratio:  0.68 Vishnu Priya Mallipeddi Electronically signed by Diannah Late Mallipeddi Signature Date/Time: 05/04/2024/9:43:29 AM    Final    CT ANGIO HEAD NECK W WO CM Result Date: 05/03/2024 EXAM: CTA HEAD AND NECK WITH AND WITHOUT 05/03/2024 06:32:34 PM TECHNIQUE: CTA of the head and neck was performed with and without the administration of 75 mL of iohexol  (OMNIPAQUE ) 350 MG/ML injection. Multiplanar 2D and/or 3D reformatted images are provided for review. Automated exposure control, iterative reconstruction, and/or weight based adjustment of the mA/kV was utilized to reduce the radiation dose to as low as reasonably achievable. Stenosis of the internal carotid arteries measured using NASCET criteria. COMPARISON: Same day MRI and MRA head. CLINICAL HISTORY: Stroke. Patient arrived via POV from home after calling her Cardiologist and being advised to seek medical evaluation in the ER for possible recent Stroke. Patient reports approximately at 1800 on Saturday she began noticing numbness in her right arm and has since experienced unsteady gait. Patient reports sometimes she can hold her right arm up and sometimes it falls. Patient denies visual changes.  Patient denies blood thinners. FINDINGS: CTA NECK: AORTIC ARCH AND ARCH VESSELS: Mild to moderate atherosclerosis of the visualized aortic arch. Atherosclerosis of the proximal left subclavian artery  along with tortuosity of the vessel resulting in focal moderate stenosis. Additional atherosclerosis and mild multifocal stenosis of the distal right subclavian artery. No dissection or arterial injury. CERVICAL CAROTID ARTERIES: The right carotid artery is patent from the origin to the skull base. There is mild atherosclerosis of the common carotid artery, atherosclerosis at the right carotid bifurcation and proximal right cervical ICA. There is approximately 75% stenosis of the proximal right cervical ICA. The left carotid artery is patent from the origin to the skull base. There is calcified atherosclerosis at the carotid bifurcation without hemodynamically significant stenosis of the internal carotid artery. There is significant stenosis at the origin of the left external carotid artery. No dissection or arterial injury. CERVICAL VERTEBRAL ARTERIES: The vertebral arteries are patent from the origins to the proximal aspect of the intracranial segments. The right vertebral artery is patent to the vertebrobasilar confluence. There is minimal atherosclerosis of the proximal right V4 segment. The V4 segment of the nondominant left vertebral artery appears occluded after the origin of the left PICA. There is reconstitution of the distal left P4 segment which appears irregular and may be supplied via retrograde flow. No dissection or arterial injury. LUNGS AND MEDIASTINUM: Unremarkable. SOFT TISSUES: Bilateral lens replacement. Mild mucosal thickening in the right maxillary sinus. No acute abnormality. BONES: Degenerative changes of the visualized spine. Degenerative changes of the bilateral mandibular condyles. No acute abnormality. CTA HEAD: ANTERIOR CIRCULATION: The intracranial internal carotid arteries are patent  bilaterally. There is atherosclerosis of the bilateral carotid siphons resulting in mild stenosis. There is a 1.5 mm inferiorly directed outpouching along the right supraclinoid right ICA likely reflecting an infundibulum versus small aneurysm. The anterior cerebral arteries are patent bilaterally. There is severe stenosis of the A3 segment of the left ACA. 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 Redemonstrated severe stenosis and irregularity of a proximal M2 inferior division branch of the right MCA. Additional mild atherosclerotic irregularity of multiple M2 branches of the right MCA. Additional atherosclerotic irregularity of multiple M2 branches of the left MCA. POSTERIOR CIRCULATION: The right posterior cerebral artery is patent. There is mild to moderate stenosis of the P2 segment of the right PCA. Fetal origin of the left PCA. There is severe stenosis of the proximal P2 segment at the left PCA. Additional multifocal severe stenosis of the distal P2 and P3 segments of the left PCA. No significant stenosis of the basilar artery. No significant stenosis of the vertebral arteries. OTHER: Scattered areas of acute infarct in the left frontoparietal lobes are better appreciated on same day MRI. Small remote infarct in the right cerebellum. No dural venous sinus thrombosis on this non-dedicated study. IMPRESSION: 1. 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 Reconstitution of the distal left V4 segment likely via retrograde flow. 2. Severe multifocal stenosis of the left PCA. 3. Severe stenosis of a proximal M2 branch of the right MCA. 4. Additional atherosclerosis involving multiple vessels with associated stenoses as detailed above. 5. 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 6. 1.5 mm inferiorly  directed outpouching along the right supraclinoid right ICA, likely reflecting an infundibulum versus small aneurysm. Electronically signed by: Donnice Mania MD 05/03/2024 07:10 PM EDT RP Workstation: [id number]   MR ANGIO HEAD WO CONTRAST Result Date:  05/03/2024 EXAM: MR Angiography Head without intravenous Contrast. 05/03/2024 02:46:41 PM TECHNIQUE: Magnetic resonance angiography images of the head without intravenous contrast. Multiplanar 2D and 3D reformatted images are provided for review. COMPARISON: None provided. CLINICAL HISTORY: Neuro deficit, acute, stroke suspected. FINDINGS: ANTERIOR CIRCULATION: Atherosclerotic irregularity of the intracranial internal carotid arteries without high grade stenosis. There is moderate to severe stenosis of a proximal M2 branch of the right MCA. Atherosclerotic irregularity of additional bilateral MCA branches without high grade stenosis or occlusion. The anterior cerebral arteries are patent bilaterally. There is slightly limited evaluation of the A2 and A3 segments due to motion artifact. No aneurysm. POSTERIOR CIRCULATION: The intracranial vertebral arteries are patent. The nondominant left vertebral artery terminates at the origin of the left PICA. The basilar artery is patent. The right posterior cerebral artery is patent. The left posterior cerebral artery is primarily supplied via the posterior communicating artery. There is significant irregularity and severe multifocal narrowing at the left posterior cerebral artery which is likely chronic. The superior cerebellar arteries are patent bilaterally. No aneurysm. IMPRESSION: 1. Moderate to severe stenosis of a proximal M2 branch of the right MCA. 2. Atherosclerotic irregularity of additional bilateral MCA branches without high grade stenosis or occlusion. 3. Significant irregularity and severe multifocal narrowing at the left posterior cerebral artery, likely chronic. Electronically signed by: Donnice Mania MD  05/03/2024 04:22 PM EDT RP Workstation: [id number]   MR BRAIN WO CONTRAST Result Date: 05/03/2024 EXAM: MRI BRAIN WITHOUT CONTRAST 05/03/2024 02:46:41 PM TECHNIQUE: Multiplanar multisequence MRI of the head/brain was performed without the administration of intravenous contrast. COMPARISON: None available. CLINICAL HISTORY: Neuro deficit, acute, stroke suspected. Neuro deficit, acute. FINDINGS: BRAIN AND VENTRICLES: There are foci of restricted diffusion in the left frontoparietal lobes primarily involving the left postcentral gyrus with additional focus in the posterior aspect of the left centrum Semiovale compatible with acute infarcts. There are scattered and confluent foci of T2 FLAIR hyperintensity which involve the periventricular and subcortical white matter suggestive of moderate chronic microvascular ischemic changes. Remote lacunar infarct in the left basal ganglia. Additional small remote infarcts in the right periventricular white matter and right frontal subcortical white matter. Remote infarcts in the right cerebellum. No intracranial hemorrhage. No mass. No midline shift. No hydrocephalus. The sella is unremarkable. Normal flow voids. ORBITS: Bilateral lens replacement. SINUSES AND MASTOIDS: Mucosal thickening in the right maxillary sinus. BONES AND SOFT TISSUES: Normal marrow signal. No acute soft tissue abnormality. IMPRESSION: 1. Acute infarcts in the left frontoparietal lobes, primarily involving the left postcentral gyrus, with an additional focus in the posterior aspect of the left centrum semiovale. 2. Moderate chronic microvascular ischemic changes. 3. Remote lacunar infarct in the left basal ganglia, additional small remote infarcts in the right periventricular white matter, right frontal subcortical white matter, and right cerebellum. Electronically signed by: Donnice Mania MD 05/03/2024 04:16 PM EDT RP Workstation: [id number]    Labs: BNP (last 3 results) No results for input(s):  BNP in the last 8760 hours. Basic Metabolic Panel: Recent Labs  Lab 05/03/24 1344  NA 136  K 3.9  CL 97*  CO2 27  GLUCOSE 140*  BUN 18  CREATININE 1.15*  CALCIUM  9.7   Liver Function Tests: Recent Labs  Lab 05/03/24 1344  AST 15  ALT 12  ALKPHOS 96  BILITOT 0.7  PROT 7.4  ALBUMIN  3.8   No results for input(s): LIPASE, AMYLASE in the last 168 hours. No results for input(s): AMMONIA in the last 168 hours. CBC: Recent  Labs  Lab 05/03/24 1344  WBC 8.2  NEUTROABS 5.3  HGB 12.3  HCT 37.6  MCV 82.5  PLT 271   CBG: Recent Labs  Lab 05/03/24 1311  GLUCAP 134*   Hgb A1c Recent Labs    05/03/24 1344  HGBA1C 6.2*   Anemia work up No results for input(s): VITAMINB12, FOLATE, FERRITIN, TIBC, IRON, RETICCTPCT in the last 72 hours. Cardiac Enzymes: No results for input(s): CKTOTAL, CKMB, CKMBINDEX, TROPONINI in the last 168 hours. BNP: Invalid input(s): POCBNP D-Dimer No results for input(s): DDIMER in the last 72 hours. Lipid Profile Recent Labs    05/04/24 0442  CHOL 144  HDL 34*  LDLCALC 72  TRIG 811*  CHOLHDL 4.2   Thyroid  function studies No results for input(s): TSH, T4TOTAL, T3FREE, THYROIDAB in the last 72 hours.  Invalid input(s): FREET3 Urinalysis    Component Value Date/Time   COLORURINE YELLOW 10/31/2023 1000   APPEARANCEUR CLEAR 10/31/2023 1000   LABSPEC 1.008 10/31/2023 1000   PHURINE 6.0 10/31/2023 1000   GLUCOSEU NEGATIVE 10/31/2023 1000   HGBUR NEGATIVE 10/31/2023 1000   BILIRUBINUR NEGATIVE 10/31/2023 1000   KETONESUR NEGATIVE 10/31/2023 1000   PROTEINUR NEGATIVE 10/31/2023 1000   UROBILINOGEN 0.2 04/01/2012 0833   NITRITE POSITIVE (A) 10/31/2023 1000   LEUKOCYTESUR TRACE (A) 10/31/2023 1000   Sepsis Labs Recent Labs  Lab 05/03/24 1344  WBC 8.2   Microbiology No results found for this or any previous visit (from the past 240 hours).   Time coordinating discharge: 25  minutes  SIGNED: Mennie LAMY, MD  [facility] 05/04/2024, 11:37 AM  If 7PM-7AM, please contact night-coverage [email or web address]

## 2024-05-04 NOTE — Telephone Encounter (Signed)
 Per Dr. Malipeddi   2 week NL monitor for stroke   Order placed for Zio monitor

## 2024-05-04 NOTE — Consult Note (Signed)
 I connected with  Donna Garza on 05/04/24 by a video enabled telemedicine application and verified that I am speaking with the correct person using two identifiers.   I discussed the limitations of evaluation and management by telemedicine. The patient expressed understanding and agreed to proceed.  Location of patient: [facility] Location of physician: [facility]  Neurology Consultation Reason for Consult: Stroke Referring Physician: Dr. Tully Carwin  CC: Right arm numbness and unsteady gait  History is obtained from: Patient, chart review  HPI: Donna Garza is a 83 y.o. female with past medical history of hypertension, diabetes, chronic kidney disease, peripheral artery disease who presented to emergency room due to right-sided numbness and unsteady gait.  States that she was at her house sitting on the couch and fell asleep around 6 PM on 04/29/2024.  When she woke up she noticed that her right arm felt numb.  She tried moving it thinking it might be asleep but eventually did not improve.  Last known normal: 04/29/2024 around 6 PM Event happened at home No tPA or thrombectomy as outside window and no large vessel occlusion mRS 0   ROS: All other systems reviewed and negative except as noted in the HPI.   Past Medical History:  Diagnosis Date   Anemia    hx of during pregnancy   CKD (chronic kidney disease)    GERD (gastroesophageal reflux disease)    Heart murmur    evaluated by Dr. Francyne   Hiatal hernia 2012   HTN (hypertension)    Hx of adenomatous colonic polyps    Last colonoscopy 2007. Benign polyp at that time. Tubular adenoma found at colonoscopy in 2004.   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   PAD (peripheral artery disease)    >10 years   Peripheral vascular disease    Schatzki's ring 2012   Tubular adenoma 2012   Type 2 diabetes mellitus (HCC)     Family History  Problem Relation Age of Onset   Breast cancer Mother    Cancer Mother         Breast   Alzheimer's disease Mother    Hyperlipidemia Mother    Hypertension Mother    Breast cancer Daughter    Cancer Daughter    Heart attack Father    Heart disease Father        Before age 73   Hypertension Father    Colon cancer Neg Hx     Social History:  reports that she has never smoked. She has never used smokeless tobacco. She reports that she does not drink alcohol  and does not use drugs.  Medications Prior to Admission  Medication Sig Dispense Refill Last Dose/Taking   amLODipine  (NORVASC ) 2.5 MG tablet Take 1 tablet (2.5 mg total) by mouth daily. 90 tablet 3 05/03/2024 Morning   aspirin  81 MG tablet Take 81 mg by mouth daily.   05/03/2024 at  8:00 AM   Coenzyme Q10 (COQ10) 100 MG CAPS Take 100 mg by mouth at bedtime.   05/02/2024 Bedtime   dorzolamide  (TRUSOPT ) 2 % ophthalmic solution Place 1 drop into the right eye 2 (two) times daily.   05/03/2024 Morning   hydrochlorothiazide  (HYDRODIURIL ) 25 MG tablet Take 25 mg by mouth daily.   05/03/2024 Morning   loratadine  (CLARITIN ) 10 MG tablet Take 10 mg by mouth daily.   05/03/2024 Morning   losartan  (COZAAR ) 100 MG tablet Take 100 mg by mouth daily.   05/03/2024 Morning   omeprazole (  PRILOSEC) 20 MG capsule Take 20 mg by mouth daily.    05/03/2024 Morning   rosuvastatin  (CRESTOR ) 20 MG tablet Take 20 mg by mouth at bedtime.   05/02/2024 Bedtime      Exam: Current vital signs: BP (!) 148/69 (BP Location: Right Arm)   Pulse 73   Temp 98.3 F (36.8 C) (Oral)   Resp 18   Ht 5' 6 (1.676 m)   Wt 73 kg   SpO2 93%   BMI 25.98 kg/m  Vital signs in last 24 hours: Temp:  [98.1 F (36.7 C)-98.8 F (37.1 C)] 98.3 F (36.8 C) (09/30 0320) Pulse Rate:  [68-97] 73 (09/30 0320) Resp:  [16-18] 18 (09/30 0320) BP: (112-166)/(67-80) 148/69 (09/30 0320) SpO2:  [93 %-98 %] 93 % (09/30 0320) Weight:  [73 kg-73.5 kg] 73 kg (09/29 1921)   Physical Exam  Constitutional: Appears well-developed and well-nourished.  Psych: Affect  appropriate to situation Neuro: AO x 3, cranial nerves grossly intact, antigravity without drift in all extremities, sensory decreased to light touch in right arm, FTN intact bilaterally   NIHSS 1  1A: Level of consciousness --> 0 = Alert; keenly responsive 1B: Ask month and age --> 0 = Both questions right 1C: 'Blink eyes' & 'squeeze hands' --> 0 = Performs both tasks 2: Horizontal extraocular movements --> 0 = Normal 3: Visual fields --> 0 = No visual loss 4: Facial palsy --> 0 = Normal symmetry 5A: Left arm motor drift --> 0 = No drift for 10 seconds 5B: Right arm motor drift --> 0 = No drift for 10 seconds 6A: Left leg motor drift --> 0 = No drift for 5 seconds 6B: Right leg motor drift --> 0 = No drift for 5 seconds 7: Limb Ataxia --> 0 = No ataxia 8: Sensation --> 1 = Mild-moderate loss: less sharp/more dull 9: Language/aphasia --> 0 = Normal; no aphasia 10: Dysarthria --> 0 = Normal 11: Extinction/inattention --> 0 = No abnormality     I have reviewed labs in epic and the results pertinent to this consultation are: CBC:  Recent Labs  Lab 05/03/24 1344  WBC 8.2  NEUTROABS 5.3  HGB 12.3  HCT 37.6  MCV 82.5  PLT 271    Basic Metabolic Panel:  Lab Results  Component Value Date   NA 136 05/03/2024   K 3.9 05/03/2024   CO2 27 05/03/2024   GLUCOSE 140 (H) 05/03/2024   BUN 18 05/03/2024   CREATININE 1.15 (H) 05/03/2024   CALCIUM  9.7 05/03/2024   GFRNONAA 48 (L) 05/03/2024   GFRAA 71 (L) 02/02/2013   Lipid Panel:  Lab Results  Component Value Date   LDLCALC 72 05/04/2024   HgbA1c:  Lab Results  Component Value Date   HGBA1C 6.2 (H) 05/03/2024   Urine Drug Screen: No results found for: LABOPIA, COCAINSCRNUR, LABBENZ, AMPHETMU, THCU, LABBARB  Alcohol  Level No results found for: ETH   I have reviewed the images obtained:  MRI brain without contrast 05/03/2024: Acute infarcts in the left frontoparietal lobes, primarily involving the left  postcentral gyrus, with an additional focus in the posterior aspect of the left centrum semiovale. Moderate chronic microvascular ischemic changes. Remote lacunar infarct in the left basal ganglia, additional small remote infarcts in the right periventricular white matter, right frontal subcortical white matter, and right cerebellum.  MRV head without contrast 05/03/2024: Moderate to severe stenosis of a proximal M2 branch of the right MCA. Atherosclerotic irregularity of additional bilateral MCA branches without  high grade stenosis or occlusion. Significant irregularity and severe multifocal narrowing at the left posterior cerebral artery, likely chronic.  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 Reconstitution of the distal left V4 segment likely via retrograde flow. Severe multifocal stenosis of the left PCA. Severe stenosis of a proximal M2 branch of the right MCA. Additional atherosclerosis involving multiple vessels with associated stenoses as detailed above. 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 1.5 mm inferiorly directed outpouching along the right supraclinoid right ICA, likely reflecting an infundibulum versus small aneurysm.  TTE 05/04/2024: No thrombus    ASSESSMENT/PLAN: 83 year old female with right sided weakness and numbness.  Already on aspirin  81 mg daily  Acute ischemic stroke -Etiology: Likely large artery atherosclerosis  Recommendations: - Patient was already on aspirin  81 mg daily.  Therefore recommend aspirin  81 mg daily and Plavix 75 mg daily for 3 months followed by only Plavix 75 mg daily - Continue rosuvastatin  40 mg daily - Recommend cardiac monitor to look for A-fib - PT/OT - Goal blood pressure normotension - Stroke education - Follow-up with neurology in 3-4 weeks (order  placed)   Thank you for allowing us  to participate in the care of this patient. If you have any further questions, please contact  me or neurohospitalist.   Arlin Krebs Epilepsy [facility]

## 2024-05-04 NOTE — Evaluation (Signed)
 Occupational Therapy Evaluation Patient Details Name: Donna Garza MRN: [id number] DOB: 1941/04/25 Today's Date: 05/04/2024   History of Present Illness   Donna Garza is a 83 y.o. female with medical history significant for peripheral artery disease, CKD, hypertension, diabetes mellitus.  Patient presented to the ED with complaints of numbness and weakness to her right upper extremity-started at about 6 PM 2 days ago- saturday 9/27.  She reports her hand felt like it was sleeping, and she would lift her hand and it would sometimes drop, she would hold her phone and sometimes it would drop.  She denies involvement of her other extremities, no facial asymmetry, no change in speech no change in vision.  No prior history of stroke.  She is on aspirin -81 mg daily and reports compliance. (per MD)     Clinical Impressions Pt agreeable to OT and PT co-evaluation. Pt mildly unsteady when ambulating in the hall but no physical assist needed for ADL's or mobility. Mild R shoulder weakness and decreased sensation in R hand. Pt is able to complete ADL's without assist. Pt left in the bed with call bell within reach. Pt is not recommended for any further acute OT services and will be discharged to care of nursing staff for remaining length of stay.                  Functional Status Assessment   Patient has not had a recent decline in their functional status     Equipment Recommendations   None recommended by OT     Recommendations for Other Services         Precautions/Restrictions   Precautions Precautions: Fall Recall of Precautions/Restrictions: Intact Restrictions Weight Bearing Restrictions Per Provider Order: No     Mobility Bed Mobility Overal bed mobility: Independent                  Transfers Overall transfer level: Modified independent                 General transfer comment: No AD; EOB to chair; mildly unsteady.      Balance Overall  balance assessment: Mild deficits observed, not formally tested                                         ADL either performed or assessed with clinical judgement   ADL Overall ADL's : Modified independent                                       General ADL Comments: Mild unsteady movement when ambulating but no physical assist needed.     Vision Baseline Vision/History: 1 Wears glasses Ability to See in Adequate Light: 0 Adequate Patient Visual Report: No change from baseline Vision Assessment?: Wears glasses for reading     Perception Perception: Not tested       Praxis Praxis: Not tested       Pertinent Vitals/Pain Pain Assessment Pain Assessment: No/denies pain     Extremity/Trunk Assessment Upper Extremity Assessment Upper Extremity Assessment: Right hand dominant;RUE deficits/detail RUE Deficits / Details: 4+/5 shoulder abduction; 5/5 otherwise. Decreased sensation to light touch in hand but nowhere else. RUE Sensation: decreased light touch RUE Coordination: WNL   Lower Extremity Assessment Lower Extremity Assessment: Defer to PT evaluation  Cervical / Trunk Assessment Cervical / Trunk Assessment: Normal   Communication Communication Communication: No apparent difficulties   Cognition Arousal: Alert Behavior During Therapy: WFL for tasks assessed/performed Cognition: No apparent impairments                               Following commands: Intact       Cueing  General Comments   Cueing Techniques: Verbal cues                 Home Living Family/patient expects to be discharged to:: Private residence Living Arrangements: Alone Available Help at Discharge: Family;Available PRN/intermittently Type of Home: House Home Access: Stairs to enter [employer] of Steps: 5 Entrance Stairs-Rails: Left Home Layout: One level     Bathroom Shower/Tub: Chief Strategy Officer:  Standard Bathroom Accessibility: Yes How Accessible: Accessible via wheelchair;Accessible via walker Home Equipment: Grab bars - tub/shower;Rolling Walker (2 wheels)          Prior Functioning/Environment Prior Level of Function : Independent/Modified Independent;Driving             Mobility Comments: Tourist information centre manager without AD ADLs Comments: Independent; drives; works                            Co-evaluation PT/OT/SLP Co-Evaluation/Treatment: Yes Reason for Co-Treatment: To address functional/ADL transfers   OT goals addressed during session: ADL's and self-care                       End of Session Nurse Communication: Mobility status  Activity Tolerance: Patient tolerated treatment well Patient left: with call bell/phone within reach;in bed  OT Visit Diagnosis: Other symptoms and signs involving the nervous system (M70.101)                Time: 9173-9158 OT Time Calculation (min): 15 min Charges:  OT General Charges $OT Visit: 1 Visit OT Evaluation $OT Eval Low Complexity: 1 Low  Ashtyn Freilich OT, MOT  [employer] 05/04/2024, 11:30 AM

## 2024-05-04 NOTE — Plan of Care (Signed)

## 2024-05-04 NOTE — TOC Transition Note (Signed)
 Transition of Care [facility]) - Discharge Note   Patient Details  Name: Donna Garza MRN: [id number] Date of Birth: Aug 31, 1940  [facility] CM/SW Contact:  Sharlyne Stabs, RN Phone Number: 05/04/2024, 12:18 PM   Clinical Narrative:   Patient discharging home. PT is recommending outpatient PT. Patient wants [facility], orders placed.  UR is in the process of changing patient to OBS. CM follow to go explain and deliver.     Final next level of care: OP Rehab Barriers to Discharge: Barriers Resolved   Patient Goals and CMS Choice Patient states their goals for this hospitalization and ongoing recovery are:: Return home CMS Medicare.gov Compare Post Acute Care list provided to:: Patient Choice offered to / list presented to : Patient [place] ownership interest in [facility]provided to:: Patient    Discharge Placement                    Patient and family notified of of transfer: 05/04/24  Discharge Plan and Services Additional resources added to the After Visit Summary for                                       Social Drivers of Health (SDOH) Interventions SDOH Screenings   Food Insecurity: No Food Insecurity (05/03/2024)  Housing: Low Risk  (05/03/2024)  Transportation Needs: No Transportation Needs (05/03/2024)  Utilities: Not At Risk (05/03/2024)  Social Connections: Unknown (05/03/2024)  Tobacco Use: Low Risk  (05/03/2024)

## 2024-05-04 NOTE — Care Management CC44 (Signed)
 Condition Code 44 Documentation Completed  Patient Details  Name: Donna Garza MRN: [id number] Date of Birth: 08-25-1940   Condition Code 44 given:  Yes Patient signature on Condition Code 44 notice:  Yes Documentation of 2 MD's agreement:  Yes Code 44 added to claim:  Yes    Sharlyne Stabs, RN 05/04/2024, 12:32 PM
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
## 2024-05-07 ENCOUNTER — Ambulatory Visit: Attending: Internal Medicine | Admitting: Physical Therapy

## 2024-05-07 ENCOUNTER — Encounter: Payer: Self-pay | Admitting: Physical Therapy

## 2024-05-07 DIAGNOSIS — R2689 Other abnormalities of gait and mobility: Secondary | ICD-10-CM | POA: Insufficient documentation

## 2024-05-07 DIAGNOSIS — I639 Cerebral infarction, unspecified: Secondary | ICD-10-CM | POA: Insufficient documentation

## 2024-05-07 DIAGNOSIS — M6281 Muscle weakness (generalized): Secondary | ICD-10-CM | POA: Insufficient documentation

## 2024-05-10 ENCOUNTER — Ambulatory Visit: Admitting: Physical Therapy

## 2024-05-10 ENCOUNTER — Encounter: Payer: Self-pay | Admitting: Physical Therapy

## 2024-05-10 DIAGNOSIS — M6281 Muscle weakness (generalized): Secondary | ICD-10-CM | POA: Diagnosis not present

## 2024-05-10 DIAGNOSIS — R2689 Other abnormalities of gait and mobility: Secondary | ICD-10-CM
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
## 2024-05-17 ENCOUNTER — Encounter: Payer: Self-pay | Admitting: Physical Therapy

## 2024-05-17 ENCOUNTER — Ambulatory Visit: Admitting: Physical Therapy

## 2024-05-17 DIAGNOSIS — R2689 Other abnormalities of gait and mobility: Secondary | ICD-10-CM

## 2024-05-17 DIAGNOSIS — M6281 Muscle weakness (generalized): Secondary | ICD-10-CM | POA: Diagnosis not present
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
## 2024-05-24 ENCOUNTER — Ambulatory Visit: Admitting: Physical Therapy

## 2024-05-26 ENCOUNTER — Encounter (INDEPENDENT_AMBULATORY_CARE_PROVIDER_SITE_OTHER): Admitting: Ophthalmology

## 2024-05-26 DIAGNOSIS — D3131 Benign neoplasm of right choroid: Secondary | ICD-10-CM | POA: Diagnosis not present

## 2024-05-26 DIAGNOSIS — H34831 Tributary (branch) retinal vein occlusion, right eye, with macular edema: Secondary | ICD-10-CM

## 2024-05-26 DIAGNOSIS — H35372 Puckering of macula, left eye: Secondary | ICD-10-CM | POA: Diagnosis not present

## 2024-05-26 DIAGNOSIS — H35033 Hypertensive retinopathy, bilateral: Secondary | ICD-10-CM

## 2024-05-26 DIAGNOSIS — D3132 Benign neoplasm of left choroid: Secondary | ICD-10-CM | POA: Diagnosis not present

## 2024-05-26 DIAGNOSIS — I10 Essential (primary) hypertension: Secondary | ICD-10-CM

## 2024-05-26 DIAGNOSIS — H43812 Vitreous degeneration, left eye: Secondary | ICD-10-CM | POA: Diagnosis not present

## 2024-05-31 ENCOUNTER — Ambulatory Visit: Admitting: Physical Therapy

## 2024-05-31 ENCOUNTER — Encounter: Payer: Self-pay | Admitting: Physical Therapy

## 2024-05-31 DIAGNOSIS — I639 Cerebral infarction, unspecified: Secondary | ICD-10-CM | POA: Diagnosis not present

## 2024-05-31 DIAGNOSIS — M6281 Muscle weakness (generalized): Secondary | ICD-10-CM | POA: Diagnosis not present

## 2024-05-31 DIAGNOSIS — R2689 Other abnormalities of gait and mobility: Secondary | ICD-10-CM
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
## 2024-06-08 ENCOUNTER — Encounter: Payer: Self-pay | Admitting: Cardiology

## 2024-06-08 ENCOUNTER — Ambulatory Visit: Attending: Cardiology | Admitting: Cardiology

## 2024-06-08 VITALS — BP 108/60 | HR 83 | Ht 66.0 in | Wt 159.0 lb

## 2024-06-08 DIAGNOSIS — E782 Mixed hyperlipidemia: Secondary | ICD-10-CM | POA: Diagnosis not present

## 2024-06-08 DIAGNOSIS — I639 Cerebral infarction, unspecified: Secondary | ICD-10-CM | POA: Diagnosis not present

## 2024-06-08 DIAGNOSIS — I10 Essential (primary) hypertension: Secondary | ICD-10-CM

## 2024-06-08 DIAGNOSIS — I739 Peripheral vascular disease, unspecified: Secondary | ICD-10-CM | POA: Diagnosis not present

## 2024-06-08 NOTE — Progress Notes (Addendum)
 Cardiology Office Note:  .   Date:  06/08/2024  ID:  [name], DOB [birth year], MRN [id number] PCP: [facility]  [facility] Providers Cardiologist:  Jerel Balding, MD {  History of Present Illness: .   KIRANDEEP FARISS is a 83 y.o. female with history of HTN, HLD, DM type 2 and an extensive history of PAD of the lower extremities.     PAD Previous right common femoral to popliteal SVG bypass graft 2013, subsequent angioplasty 2014, with single-vessel anterior tibial artery runoff on the right, monophasic flow in the right lower extremity with velocities suggestive of impending right bypass graft failure.  Tentative plans for femoral arterectomy with left common femoral above-the-knee popliteal artery bypass. 09/2023 preoperative normal stress test 11/2023 preoperative carotid Dopplers with mild 1 to 39% stenosis 11/2023 underwent: Left common femoral, profundofemoral, superficial femoral and external iliac artery endarterectomy with bovine pericardial patch angioplasty and Left common femoral to above-knee popliteal artery bypass with 6 mm ringed PTFE  CVA 04/2024 admission with acute ischemic infarcts infarcts in the left frontal parietal lobe.  No tPA or interventions, outside window.  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  Severe stenosis of proximal the left PCA.  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  1.5 mm right ICA reflecting aneurysm versus infundibulum.  EF normal. Heart monitor with no atrial fibrillation.  Social history  No prior tobacco, alcohol , drug use. Slightly active 1 to 2 days/week.  Walking 5 minutes at a time. Single with 4 children.  Daughter in [place]  takes care of her the most.    Patient with extensive history of PAD with previous SVG bypass graft failure and underwent bilateral lower extremity bypasses 11/2023. Last seen 10/2023 for preoperative evaluation to undergo above-the-knee popliteal bypass surgery.   Had poor exercise function so stress test was ordered which was normal. Recently 04/2024 she was admitted for acute stroke in the left frontal parietal lobe.  Discharged on monitor with no evidence of atrial fibrillation.  Discharged on DAPT with aspirin  and Plavix.   Today patient presents for follow-up after her discharge for CVA.  She has no acute concerns today.  Continues to have claudication-like symptoms worse in her left groin.  She can walk for approximately 5 minutes before she has to stop due to pain although has no other exertional symptoms.  Before she was walking 4 to 5 miles each day so she would like to get back to this point.  Stroke symptoms are improving.  Reports discomfort in distal fingers.  ROS: Denies: Chest pain, shortness of breath, orthopnea, peripheral edema, palpitations, decreased exercise intolerance, fatigue, lightheadedness.   Studies Reviewed: .         Risk Assessment/Calculations:             Physical Exam:   VS:  BP 108/60   Pulse 83   Ht 5' 6 (1.676 m)   Wt 159 lb (72.1 kg)   SpO2 97%   BMI 25.66 kg/m    Wt Readings from Last 3 Encounters:  06/08/24 159 lb (72.1 kg)  05/03/24 160 lb 15 oz (73 kg)  03/10/24 162 lb (73.5 kg)    GEN: Well nourished, well developed in no acute distress NECK: No JVD; No carotid bruits CARDIAC: RRR, no murmurs, rubs, gallops RESPIRATORY:  Clear to auscultation without rales, wheezing or rhonchi  ABDOMEN: Soft, non-tender, non-distended EXTREMITIES:  No edema; No deformity  ASSESSMENT AND PLAN: .    Extensive PAD Status post bilateral lower extremity bypasses 2013 and 11/2023 Followed by vascular surgery.  On the right side she has evidence of proximal anastomotic stenosis with decreased velocities, plan for bypass graft with duplex 09/2024. Continue with rosuvastatin  40 mg. Encouraged continued exercise.  CVA Followed by neurology overall has improved without any significant neurological deficits..  No  evidence of atrial fibrillation on heart monitor.  Continue DAPT therapy with aspirin  and Plavix per neurology.  Valvular disease  04/2024 echocardiogram with preserved biventricular function.  Mild mitral stenosis.  Mean gradient 3.  Mild to moderate aortic stenosis.  VTI 1.18.  Mean gradient 16.  V-max 2.68.  Has no exertional symptoms. Repeat echocardiogram 1-2 years.  Hypertension Well-controlled 108/60 today. Continue amlodipine  2.5 mg, HCTZ 25 mg, losartan  100 mg.  Hyperlipidemia Cholesterol is very close to target goal less than 70.  LDL was 72 04/2024.  This was before uptitration of rosuvastatin .  She is not fasted today.  Recommend getting lipid panel at her next appointment with neurology tomorrow when she is fasted.  Type 2 diabetes A1c 6.2%.  Dispo: Follow-up with Dr. Francyne March 2026  Signed, Thom LITTIE Sluder, PA-C

## 2024-06-08 NOTE — Patient Instructions (Signed)
 Medication Instructions:  none *If you need a refill on your cardiac medications before your next appointment, please call your pharmacy*  Lab Work: none If you have labs (blood work) drawn today and your tests are completely normal, you will receive your results only by: MyChart Message (if you have MyChart) OR A paper copy in the mail If you have any lab test that is abnormal or we need to change your treatment, we will call you to review the results.  Testing/Procedures: none  Follow-Up: At [facility], you and your health needs are our priority.  As part of our continuing mission to provide you with exceptional heart care, our providers are all part of one team.  This team includes your primary Cardiologist (physician) and Advanced Practice Providers or APPs (Physician Assistants and Nurse Practitioners) who all work together to provide you with the care you need, when you need it.  Your next appointment:   March 2026   Provider:   Jerel Balding, MD    We recommend signing up for the patient portal called MyChart.  Sign up information is provided on this After Visit Summary.  MyChart is used to connect with patients for Virtual Visits (Telemedicine).  Patients are able to view lab/test results, encounter notes, upcoming appointments, etc.  Non-urgent messages can be sent to your provider as well.   To learn more about what you can do with MyChart, go to [email or web address].   Other Instructions None

## 2024-06-09 ENCOUNTER — Ambulatory Visit: Admitting: Neurology

## 2024-06-09 ENCOUNTER — Telehealth: Payer: Self-pay | Admitting: Neurology

## 2024-06-09 NOTE — Telephone Encounter (Signed)
 MYC cxl

## 2024-06-09 NOTE — Telephone Encounter (Signed)
 Patient rescheduled appointment on 08/10/24 at 2:45 pm

## 2024-06-18 DIAGNOSIS — I639 Cerebral infarction, unspecified: Secondary | ICD-10-CM

## 2024-06-28 ENCOUNTER — Ambulatory Visit: Payer: Self-pay | Admitting: Internal Medicine

## 2024-07-21 ENCOUNTER — Encounter (INDEPENDENT_AMBULATORY_CARE_PROVIDER_SITE_OTHER): Admitting: Ophthalmology

## 2024-07-21 DIAGNOSIS — H43812 Vitreous degeneration, left eye: Secondary | ICD-10-CM

## 2024-07-21 DIAGNOSIS — H35033 Hypertensive retinopathy, bilateral: Secondary | ICD-10-CM

## 2024-07-21 DIAGNOSIS — D3132 Benign neoplasm of left choroid: Secondary | ICD-10-CM

## 2024-07-21 DIAGNOSIS — H34831 Tributary (branch) retinal vein occlusion, right eye, with macular edema: Secondary | ICD-10-CM

## 2024-07-21 DIAGNOSIS — D3131 Benign neoplasm of right choroid: Secondary | ICD-10-CM | POA: Diagnosis not present

## 2024-07-21 DIAGNOSIS — I10 Essential (primary) hypertension: Secondary | ICD-10-CM

## 2024-07-21 DIAGNOSIS — H35372 Puckering of macula, left eye: Secondary | ICD-10-CM | POA: Diagnosis not present

## 2024-08-10 ENCOUNTER — Ambulatory Visit: Admitting: Neurology

## 2024-08-10 ENCOUNTER — Encounter: Payer: Self-pay | Admitting: Neurology

## 2024-08-10 VITALS — BP 112/72 | HR 70 | Ht 66.0 in | Wt 156.0 lb

## 2024-08-10 DIAGNOSIS — I63512 Cerebral infarction due to unspecified occlusion or stenosis of left middle cerebral artery: Secondary | ICD-10-CM | POA: Diagnosis not present

## 2024-08-10 DIAGNOSIS — I729 Aneurysm of unspecified site: Secondary | ICD-10-CM | POA: Diagnosis not present
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
## 2024-08-10 NOTE — Patient Instructions (Signed)
 Continue with Plavix  and Crestor  At next visit in a year, we will repeat CT angiogram head to follow-up on the aneurysm Continue to follow-up with PCP Return sooner if worse

## 2024-09-08 ENCOUNTER — Ambulatory Visit (HOSPITAL_COMMUNITY)

## 2024-09-08 ENCOUNTER — Ambulatory Visit: Admitting: Vascular Surgery

## 2024-09-08 ENCOUNTER — Ambulatory Visit (HOSPITAL_COMMUNITY): Admission: RE | Admit: 2024-09-08 | Source: Ambulatory Visit

## 2024-09-15 ENCOUNTER — Encounter (INDEPENDENT_AMBULATORY_CARE_PROVIDER_SITE_OTHER): Admitting: Ophthalmology

## 2024-10-06 ENCOUNTER — Ambulatory Visit (HOSPITAL_COMMUNITY)

## 2024-10-06 ENCOUNTER — Ambulatory Visit: Admitting: Vascular Surgery

## 2025-08-16 ENCOUNTER — Ambulatory Visit: Admitting: Neurology
